# Patient Record
Sex: Female | Born: 1935 | Race: White | Hispanic: No | Marital: Single | Smoking: Former smoker
Health system: Southern US, Community
[De-identification: ages and names within clinical notes are randomized; demographics above are authoritative.]

## PROBLEM LIST (undated history)

## (undated) DIAGNOSIS — R51 Headache: Secondary | ICD-10-CM

## (undated) DIAGNOSIS — K219 Gastro-esophageal reflux disease without esophagitis: Secondary | ICD-10-CM

## (undated) DIAGNOSIS — K5790 Diverticulosis of intestine, part unspecified, without perforation or abscess without bleeding: Secondary | ICD-10-CM

## (undated) DIAGNOSIS — I10 Essential (primary) hypertension: Secondary | ICD-10-CM

## (undated) DIAGNOSIS — G5602 Carpal tunnel syndrome, left upper limb: Secondary | ICD-10-CM

## (undated) DIAGNOSIS — Z972 Presence of dental prosthetic device (complete) (partial): Secondary | ICD-10-CM

## (undated) DIAGNOSIS — K922 Gastrointestinal hemorrhage, unspecified: Secondary | ICD-10-CM

## (undated) DIAGNOSIS — M1990 Unspecified osteoarthritis, unspecified site: Secondary | ICD-10-CM

## (undated) DIAGNOSIS — J90 Pleural effusion, not elsewhere classified: Secondary | ICD-10-CM

## (undated) DIAGNOSIS — H3530 Unspecified macular degeneration: Secondary | ICD-10-CM

## (undated) DIAGNOSIS — Z974 Presence of external hearing-aid: Secondary | ICD-10-CM

## (undated) DIAGNOSIS — F0390 Unspecified dementia without behavioral disturbance: Secondary | ICD-10-CM

## (undated) DIAGNOSIS — Z973 Presence of spectacles and contact lenses: Secondary | ICD-10-CM

## (undated) HISTORY — DX: Essential (primary) hypertension: I10

## (undated) HISTORY — DX: Unspecified osteoarthritis, unspecified site: M19.90

## (undated) HISTORY — PX: THYROID CYST EXCISION: SHX2511

## (undated) HISTORY — PX: EYE SURGERY: SHX253

## (undated) HISTORY — PX: APPENDECTOMY: SHX54

## (undated) HISTORY — PX: TONSILLECTOMY: SUR1361

## (undated) HISTORY — PX: KNEE SURGERY: SHX244

## (undated) HISTORY — PX: HIP SURGERY: SHX245

## (undated) HISTORY — DX: Gastrointestinal hemorrhage, unspecified: K92.2

---

## 1998-07-20 ENCOUNTER — Other Ambulatory Visit: Admission: RE | Admit: 1998-07-20 | Discharge: 1998-07-20 | Payer: Self-pay | Admitting: Gynecology

## 1999-07-29 ENCOUNTER — Other Ambulatory Visit: Admission: RE | Admit: 1999-07-29 | Discharge: 1999-07-29 | Payer: Self-pay | Admitting: Gynecology

## 1999-08-25 ENCOUNTER — Encounter: Payer: Self-pay | Admitting: Geriatric Medicine

## 1999-08-25 ENCOUNTER — Encounter: Admission: RE | Admit: 1999-08-25 | Discharge: 1999-08-25 | Payer: Self-pay | Admitting: Geriatric Medicine

## 2000-08-01 ENCOUNTER — Other Ambulatory Visit: Admission: RE | Admit: 2000-08-01 | Discharge: 2000-08-01 | Payer: Self-pay | Admitting: Gynecology

## 2000-08-24 ENCOUNTER — Other Ambulatory Visit: Admission: RE | Admit: 2000-08-24 | Discharge: 2000-08-24 | Payer: Self-pay | Admitting: Gynecology

## 2000-09-07 ENCOUNTER — Encounter: Payer: Self-pay | Admitting: Gynecology

## 2000-09-07 ENCOUNTER — Encounter: Admission: RE | Admit: 2000-09-07 | Discharge: 2000-09-07 | Payer: Self-pay | Admitting: Gynecology

## 2000-09-21 ENCOUNTER — Ambulatory Visit (HOSPITAL_COMMUNITY): Admission: RE | Admit: 2000-09-21 | Discharge: 2000-09-21 | Payer: Self-pay | Admitting: Gynecology

## 2001-09-30 ENCOUNTER — Other Ambulatory Visit: Admission: RE | Admit: 2001-09-30 | Discharge: 2001-09-30 | Payer: Self-pay | Admitting: Gynecology

## 2001-11-29 ENCOUNTER — Encounter: Admission: RE | Admit: 2001-11-29 | Discharge: 2001-11-29 | Payer: Self-pay | Admitting: Gynecology

## 2001-11-29 ENCOUNTER — Encounter: Payer: Self-pay | Admitting: Gynecology

## 2002-12-03 ENCOUNTER — Encounter: Payer: Self-pay | Admitting: Gynecology

## 2002-12-03 ENCOUNTER — Encounter: Admission: RE | Admit: 2002-12-03 | Discharge: 2002-12-03 | Payer: Self-pay | Admitting: Gynecology

## 2003-12-04 ENCOUNTER — Other Ambulatory Visit: Admission: RE | Admit: 2003-12-04 | Discharge: 2003-12-04 | Payer: Self-pay | Admitting: Gynecology

## 2003-12-07 ENCOUNTER — Encounter: Admission: RE | Admit: 2003-12-07 | Discharge: 2003-12-07 | Payer: Self-pay | Admitting: Geriatric Medicine

## 2004-03-14 ENCOUNTER — Encounter: Admission: RE | Admit: 2004-03-14 | Discharge: 2004-03-14 | Payer: Self-pay | Admitting: Orthopedic Surgery

## 2004-04-08 ENCOUNTER — Inpatient Hospital Stay (HOSPITAL_COMMUNITY): Admission: RE | Admit: 2004-04-08 | Discharge: 2004-04-11 | Payer: Self-pay | Admitting: Orthopedic Surgery

## 2004-06-24 ENCOUNTER — Encounter: Admission: RE | Admit: 2004-06-24 | Discharge: 2004-06-24 | Payer: Self-pay | Admitting: Orthopedic Surgery

## 2004-07-29 ENCOUNTER — Ambulatory Visit (HOSPITAL_BASED_OUTPATIENT_CLINIC_OR_DEPARTMENT_OTHER): Admission: RE | Admit: 2004-07-29 | Discharge: 2004-07-29 | Payer: Self-pay | Admitting: *Deleted

## 2004-07-29 ENCOUNTER — Ambulatory Visit (HOSPITAL_COMMUNITY): Admission: RE | Admit: 2004-07-29 | Discharge: 2004-07-29 | Payer: Self-pay | Admitting: *Deleted

## 2004-08-28 HISTORY — PX: JOINT REPLACEMENT: SHX530

## 2004-12-09 ENCOUNTER — Encounter: Admission: RE | Admit: 2004-12-09 | Discharge: 2004-12-09 | Payer: Self-pay | Admitting: Gynecology

## 2005-05-04 ENCOUNTER — Inpatient Hospital Stay (HOSPITAL_COMMUNITY): Admission: RE | Admit: 2005-05-04 | Discharge: 2005-05-09 | Payer: Self-pay | Admitting: Orthopedic Surgery

## 2005-05-04 ENCOUNTER — Ambulatory Visit: Payer: Self-pay | Admitting: Physical Medicine & Rehabilitation

## 2005-12-11 ENCOUNTER — Encounter: Admission: RE | Admit: 2005-12-11 | Discharge: 2005-12-11 | Payer: Self-pay | Admitting: Geriatric Medicine

## 2007-03-27 ENCOUNTER — Encounter: Admission: RE | Admit: 2007-03-27 | Discharge: 2007-03-27 | Payer: Self-pay | Admitting: Geriatric Medicine

## 2010-09-18 ENCOUNTER — Encounter: Payer: Self-pay | Admitting: Geriatric Medicine

## 2010-09-28 HISTORY — PX: CARPAL TUNNEL RELEASE: SHX101

## 2010-09-29 ENCOUNTER — Encounter (HOSPITAL_BASED_OUTPATIENT_CLINIC_OR_DEPARTMENT_OTHER)
Admission: RE | Admit: 2010-09-29 | Discharge: 2010-09-29 | Disposition: A | Discharge status: Home / Self Care | Payer: Medicare Other | Source: Ambulatory Visit | Attending: Orthopedic Surgery | Admitting: Orthopedic Surgery

## 2010-09-29 ENCOUNTER — Ambulatory Visit
Admission: RE | Admit: 2010-09-29 | Discharge: 2010-09-29 | Disposition: A | Discharge status: Home / Self Care | Payer: Medicare Other | Source: Ambulatory Visit | Attending: Orthopedic Surgery | Admitting: Orthopedic Surgery

## 2010-09-29 ENCOUNTER — Other Ambulatory Visit: Payer: Self-pay | Admitting: Orthopedic Surgery

## 2010-09-29 DIAGNOSIS — Z01812 Encounter for preprocedural laboratory examination: Secondary | ICD-10-CM | POA: Insufficient documentation

## 2010-09-29 DIAGNOSIS — Z01811 Encounter for preprocedural respiratory examination: Secondary | ICD-10-CM

## 2010-09-29 DIAGNOSIS — Z01810 Encounter for preprocedural cardiovascular examination: Secondary | ICD-10-CM | POA: Insufficient documentation

## 2010-09-29 LAB — BASIC METABOLIC PANEL
BUN: 10 mg/dL (ref 6–23)
Calcium: 10.1 mg/dL (ref 8.4–10.5)
Chloride: 96 mEq/L (ref 96–112)
GFR calc Af Amer: >60 mL/min (ref >60)
Glucose, Bld: 105 mg/dL — ABNORMAL HIGH (ref 70–99)
Potassium: 4.1 mEq/L (ref 3.5–5.1)
Sodium: 137 mEq/L (ref 135–145)

## 2010-10-03 ENCOUNTER — Ambulatory Visit (HOSPITAL_BASED_OUTPATIENT_CLINIC_OR_DEPARTMENT_OTHER)
Admission: RE | Admit: 2010-10-03 | Discharge: 2010-10-04 | Disposition: A | Discharge status: Home / Self Care | Payer: Medicare Other | Source: Ambulatory Visit | Attending: Orthopedic Surgery | Admitting: Orthopedic Surgery

## 2010-10-03 DIAGNOSIS — G5600 Carpal tunnel syndrome, unspecified upper limb: Secondary | ICD-10-CM | POA: Insufficient documentation

## 2010-10-03 DIAGNOSIS — Z01818 Encounter for other preprocedural examination: Secondary | ICD-10-CM | POA: Insufficient documentation

## 2010-10-03 DIAGNOSIS — Z01812 Encounter for preprocedural laboratory examination: Secondary | ICD-10-CM | POA: Insufficient documentation

## 2010-10-17 NOTE — Op Note (Signed)
  NAMEJOVANA, [name]               ACCOUNT NO.:  [id number]  MEDICAL RECORD NO.:  [id number]            PATIENT TYPE:  LOCATION:                                 FACILITY:  PHYSICIAN:  Cindee Salt, M.D.            DATE OF BIRTH:  DATE OF PROCEDURE:  10/04/2010 DATE OF DISCHARGE:                              OPERATIVE REPORT   PREOPERATIVE DIAGNOSIS:  Carpal tunnel syndrome, right hand.  POSTOPERATIVE DIAGNOSIS:  Carpal tunnel syndrome, right hand.  OPERATION:  Decompression, right median nerve.  SURGEON:  Cindee Salt, MD  ASSISTANT:  None.  ANESTHESIA:  Forearm-based IV regional.  HISTORY:  The patient is a 77 year old female with a history of severe carpal tunnel syndrome, EMG nerve conductions positive creating a significant pain discomfort.  She has elected to undergo surgical decompression.  Pre, peri, and postoperative course have been discussed along with risks and complications.  She is aware that there is no guarantee with surgery, possibility of infection, recurrence, injury to arteries, nerves, tendons, incomplete relief of symptoms, dystrophy, that there will be no significant motor return, she may not have sensory return, it is being done in an effort to help relieve some of her pain, numbness, and tingling.  In the preoperative area, the patient is seen, the extremity marked by both the patient and surgeon, and antibiotic given.  PROCEDURE IN DETAIL:  The patient was brought to the operating room where a forearm based IV regional anesthetic was carried out without difficulty.  She was prepped using ChloraPrep, supine position, right arm free.  A 3-minute dry time was allowed.  Time-out taken confirming the patient and procedure.  A longitudinal incision was made in the palm and carried down through the subcutaneous tissue.  Bleeders were electrocauterized.  Palmar fascia was split.  Superficial palmar arch identified.  The flexor tendon to the ring little finger  identified.  To the ulnar side of median nerve carpal retinaculum was incised with sharp dissection.  A right angle and Sewall retractor were placed between skin and forearm fascia.  The fascia was released for approximately 1.5 cm proximal to the wrist crease under direct vision.  Canal was explored. Air compression to the nerve with an hourglass deformity was immediately apparent.  No further lesions were identified.  The wound was irrigated. Skin closed with interrupted 5-0 Vicryl Rapide sutures.  Local infiltration with 0.25%Marcaine without epinephrine was given, approximately 5 mL was used.  A sterile compressive dressing and splint with fingers free was applied.  On deflation of the tourniquet, all fingers immediately pinked.  She was taken to the recovery room for observation in satisfactory condition.  She will be discharged home to return to the [facility] in 1 week on Vicodin.    ______________________________ Cindee Salt, M.D.   ______________________________ Cindee Salt, M.D.    GK/MEDQ  D:  10/04/2010  T:  10/05/2010  Job:  [id number]  Electronically Signed by Cindee Salt M.D. on 10/17/2010 02:25:05 PM

## 2011-01-13 NOTE — Op Note (Signed)
NAMESUNNI, [name]               ACCOUNT NO.:  [id number]   MEDICAL RECORD NO.:  [id number]          PATIENT TYPE:  INP   LOCATION:  X001                         FACILITY:  [facility]   PHYSICIAN:  Marlowe Kays, M.D.  DATE OF BIRTH:  November 04, 1916   DATE OF PROCEDURE:  05/04/2005  DATE OF DISCHARGE:                                 OPERATIVE REPORT   PREOPERATIVE DIAGNOSIS:  Osteoarthritis right hip.   POSTOPERATIVE DIAGNOSIS:  Osteoarthritis right hip.   OPERATION:  Osteonics total hip replacement, right.   SURGEON:  Marlowe Kays, M.D.   ASSISTANT:  Georges Lynch. Darrelyn Hillock, M.D.   ANESTHESIA:  General.   PATHOLOGY AND JUSTIFICATION FOR PROCEDURE:  She has had a total hip  replacement on the left, total knee replacement on the right, has advanced  osteoarthritis with pain in the right hip.   PROCEDURE:  Prophylactic antibiotics, satisfactory anesthesia, Foley  catheter inserted, left lateral decubitus position on the Mark II frame,  right hip was prepped DuraPrep. Draped sterile field.  Ioban employed.  Posterior lateral incision down to the fascia lata.  Zickles band was cut.  The external rotators were detached from the femur. The partial capsulectomy  was performed and the hip dislocated. I amputated the femoral neck right  below the femoral head and cleared the pyriformis fossa of soft tissue. I  then placed a guide pin down through it into the femoral canal, used the  greater trochanteric reamer and the vertical reamers up to #8, along the way  also made my definitive cut of the femoral neck fingerbreadth above the  lesser trochanter. We then began the rasping process but we were only able  to get up to a 6. Again we took great care not to create any iatrogenic  fracture at age 47. Condition was somewhat fragile. We also took great care  not to stress the leg both to avoid fractured femur and also because of her  total knee replacement.  I then measured for the distal canal  plug at a  number 4 and this was placed. I then packed the femur with a dry gauze while  we cleared the acetabulum of soft tissue and then began deepening and  expanding reaming process working up to a 52, went through a trial reduction  for cup position stability and we then placed the final 52E Trident cup  stabilizing it with two screws and then went through another trial reduction  followed by a 10 degrees liner with scribe line at 10 o'clock. Then went  back to the femur where we water picked the femur while the methacrylate was  being mixed and then introduced it with the glue gun and pressurized  technique followed by the size 6 132 degrees neck angle prosthesis.  Methacrylate hardened and excess methacrylate had been removed, we went  through another trial reduction found a +5 neck with 36 mm head was  appropriate and stable. Consequently went ahead and placed the final 36 mm  head 5 mm neck and reduced the hip, found it to be stable, wound was  irrigated with  sterile saline. Closure performed with interrupted #1 Vicryl  in Zickle's band and fascia lata, combination of 0 and 2-0 Vicryl subcu  tissue and staples in the skin. Betadine Adaptic dry sterile  dressing were applied. She was gently placed on her back and abduction  pillow and taken recovery room in satisfactory condition. Estimated blood  loss was 800 mL. She was given 2 units of blood which were started in the  operating room.           ______________________________  Marlowe Kays, M.D.     JA/MEDQ  D:  05/04/2005  T:  05/04/2005  Job:  [id number]

## 2011-01-13 NOTE — Discharge Summary (Signed)
[name], [name]                           ACCOUNT NO.:  [id number]   MEDICAL RECORD NO.:  [id number]                   PATIENT TYPE:  INP   LOCATION:  0464                                 FACILITY:  [facility]   PHYSICIAN:  Marlowe Kays, M.D.               DATE OF BIRTH:  [birth year]   DATE OF ADMISSION:  04/08/2004  DATE OF DISCHARGE:  04/11/2004                                 DISCHARGE SUMMARY   ADMISSION DIAGNOSES:  1. Spinal stenosis at L2-3, L3-4, L4-5.  2. Hypertension.  3. Diverticulitis.  4. History of migraine.  5. Status post left total knee replacement arthroplasty with revision.  6. Right total knee replacement arthroplasty.   DISCHARGE DIAGNOSES:  1. Spinal stenosis at L2-3, L3-4, L4-5.  2. Hypertension.  3. Diverticulitis.  4. History of migraine.  5. Status post left total knee replacement arthroplasty with revision.  6. Right total knee replacement arthroplasty.  7. Urinary tract infection noted on April 06, 2004, treated with     perioperative antibiotics.   OPERATION:  On April 08, 2004, the patient underwent central decompressive  lumbar laminectomy at L2-3, L3-4, and L4-5.  Dr. Ranee Gosselin assisted.   HISTORY:  This 77 year old lady with progressive problems concerning pain  into her back and radiation to her lower extremities.  Standing increases  her discomfort which now has been going on for a better part of a year.  She  is a very active lady and has found that her overall quality of life has  deteriorated secondary to her pain and inability to get about.  She has had  injections by Dr. Ethelene Hal for pain control, Lidoderm patch, and other anti-  inflammatories, but unfortunately this has not helped.  MRI has shown multi-  level lumbar disk disease with subluxation at L3-4.  Foraminal stenosis is  seen at the other lumbar vertebrae.  Due to the progressive problems not  responding to conservative care, it is felt she would benefit with surgical  intervention, and was admitted for the above procedure.   HOSPITAL COURSE:  The patient tolerated the surgical procedure quite well.  She had somewhat of a headache as well as some nausea postoperatively.  We  put her n.p.o. for a day and changed her medications to Toradol to avoid  strong analgesics which could obstruct GI motility.  She was seen by  physical therapy on the day of discharge.  Did really quite well.  She will  be able to have physical therapy at [facility].  She was discharged home by  Mr. Avel Peace and Dr. Simonne Come.  Wound was dry.  Neurovascular lower  extremity.  She was quite relieved of her buttock pain secondary to her  present illness.   LABORATORY DATA:  CBC with differential which was within normal limits.  She  did show a mild elevation in hematocrit and hematocrit.  Blood chemistries  were negative,  and urinalysis preoperatively did show a slight urinary tract  infection and as mentioned above was treated.  Electrocardiogram showed  sinus tachycardia with occasional premature supraventricular complexes, left  atrial enlargement, left axis deviation, left ventricular hypertrophy with  repolarization abnormality.  Chest x-ray showed advanced COPD without active  diseases.   CONDITION ON DISCHARGE:  Improved and stable.   PLAN:  The patient is discharged to [facility] to continue with her rehab  postoperatively.  Dressing may be changed p.r.n.  Return to the clinic two  weeks after the surgery to see Dr. Simonne Come.  May shower in four days.  Encourage walking.   DISCHARGE MEDICATIONS:  1. Continue with her home medications under the direction of Dr. Merlene Laughter and Dr. Sherin Quarry.  2. Robaxin for muscle relaxant.  3. Demerol tabs for pain.   DIET:  Continue with home diet under the direction of Dr. Merlene Laughter and  Dr. Sherin Quarry.     [name].                 Marlowe Kays, M.D.    DLU/MEDQ  D:  04/20/2004  T:  04/20/2004   Job:  [id number]   cc:   Tasia Catchings, M.D.  [address]  [address]  [place]  [place] [address]  Fax: [phone]   Hal T. Stoneking, M.D.  [address] [place], [place] [address]  Fax: [phone]

## 2011-01-13 NOTE — H&P (Signed)
NAMESHANDREKA, DANTE               ACCOUNT NO.:  [id number]   MEDICAL RECORD NO.:  [id number]          PATIENT TYPE:  INP   LOCATION:  NA                           FACILITY:  [facility]   PHYSICIAN:  Marlowe Kays, M.D.  DATE OF BIRTH:  1916/11/01   DATE OF ADMISSION:  05/04/2005  DATE OF DISCHARGE:                                HISTORY & PHYSICAL   CHIEF COMPLAINT:  Pain in my right hip.   HISTORY OF PRESENT ILLNESS:  This 77 year old lady has had a considerable  amount of pain and discomfort into her right hip, more so at night.  She has  difficulty standing over long periods of time and as well has had more  difficulty getting about.  She is a very active lady.  Fortunately, her  husband has died recently, and she would like to remain as independent as  possible but finds that her hip is markedly interfering with her day to day  activities.  She has had left total hip replacement and arthroplasty in the  past with revision and is doing quite well with that.  X-rays have shown  degenerative changes as well as near obliteration of the joint space of the  right hip.  After much discussion, including the risks and benefits of  surgery, it is decided to go ahead with the above procedure.  The patient  has been cleared preoperatively by Dr. Merlene Laughter of [facility] at [facility] for this procedure.   PAST MEDICAL HISTORY:  This lady has been in relatively good health  throughout her lifetime.   PAST SURGICAL HISTORY:  1.  Appendectomy in 1930.  2.  Right total knee replacement arthroplasty in 1996.  3.  Left hip replacement originally in 1980 with revision in 1997.  4.  Partial thyroidectomy in 1990.   Currently, she is being treated for hypertension.   CURRENT MEDICATIONS:  1.  Norvasc 10 mg 1 daily.  2.  Univasc 15 mg 1 daily.  3.  Hydrochlorothiazide 25 mg daily.  4.  Celebrex 200 mg daily.  5.  Doxazosin 4 mg daily.   FAMILY HISTORY:  Positive for heart  disease in her mother, who died in 58.  Father was diabetic and died at 62.   SOCIAL HISTORY:  The patient is widowed.  Has retired.  Has no intake of  tobacco products but has a glass of white wine daily.  She has one child,  one daughter, who lives in [place], and will be here during the surgery.  She lives in a garden home in [place].   REVIEW OF SYSTEMS:  CNS:  No seizures or paralysis.  No numbness or double  vision.  RESPIRATORY:  No productive cough.  No hemoptysis.  No shortness of  breath.  CARDIOVASCULAR:  No chest pain.  No angina.  No orthopnea.  GASTROINTESTINAL:  No nausea, vomiting, melena, or bloody stools.  GENITOURINARY:  No discharge, dysuria, or hematuria.  MUSCULOSKELETAL:  Primarily as in the present illness.   PHYSICAL EXAMINATION:  VITAL SIGNS:  Blood pressure 118/72, pulse 86,  respirations 12.  GENERAL:  An alert, cooperative, friendly 77 year old white female who is  very nicely dressed and oriented x 3.  HEENT:  Normocephalic.  PERRLA.  EOMs are intact.  Oropharynx is clear.  CHEST:  Clear to auscultation.  No rales or rhonchi.  HEART:  Regular rate and rhythm.  No murmurs are heard.  ABDOMEN:  Soft and nontender.  Liver and spleen not felt.  GENITOURINARY/RECTAL/BREASTS:  Not done.  Not pertinent to the present  illness.  EXTREMITIES:  Patient has painful range of motion of the right hip,  particularly on internal and external rotation, which is really quite  minimal.  Neurovascular is intact to the right lower extremity.   ADMISSION DIAGNOSES:  1.  Osteoarthritis, right hip.  2.  Hypertension.   PLAN:  Patient will undergo right total hip replacement arthroplasty.  We  may consider her returning to her home at [facility] with outpatient  physical therapy, as she has a Comptroller during the day.  She may need to be in  [facility] rehab for a little while, and we will certainly see how she does with  her post total hip therapy.  Should we have  any medical problems, we will  certainly ask Dr. Merlene Laughter to follow along with [place] during this  patient's hospitalization.      [name].    ______________________________  Marlowe Kays, M.D.    DLU/MEDQ  D:  04/27/2005  T:  04/27/2005  Job:  [id number]   cc:   Hal T. Stoneking, M.D.  [address] [place], [place] [address]  Fax: [phone]

## 2011-01-13 NOTE — Discharge Summary (Signed)
[name], [name]               ACCOUNT NO.:  [id number]   MEDICAL RECORD NO.:  [id number]          PATIENT TYPE:  INP   LOCATION:  1504                         FACILITY:  [facility]   PHYSICIAN:  Marlowe Kays, M.D.  DATE OF BIRTH:  25-Aug-1917   DATE OF ADMISSION:  05/04/2005  DATE OF DISCHARGE:                                 DISCHARGE SUMMARY   ADMITTING DIAGNOSES:  1.  Osteoarthritis of the right hip.  2.  Hypertension.   DISCHARGE DIAGNOSES:  1.  Osteoarthritis of the right hip.  2.  Subluxation right total hip arthroplasty secondary to malposition of the      acetabular component.  3.  Hypertension.  4.  Hypokalemia (treated).  5.  Hyponatremia (treated).  6.  Postoperative anemia (treated with transfusion).   OPERATIONS:  1.  On May 04, 2005 the patient underwent Osteonics total hip      replacement arthroplasty of the right hip; Dr. Georges Lynch. Gioffre      assisted.  2.  Revision of acetabular component right total hip arthroplasty, Dr. Lajoyce Corners assisted.   BRIEF HISTORY:  This 77 year old lady with progressing pain into her right  hip, difficulty sleeping, difficulty getting about. She is a very active  lady who enjoys the social life at [employer] as well as her family, and  has found that the pain in her right hip has markedly interfered with her  day-to-day activities. 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 After much consideration  including the risks and benefits of surgery, she was scheduled for the above  procedure. Dr. Merlene Laughter of [facility] at [facility]  cleared her for this procedure.   COURSE IN THE HOSPITAL:  The patient tolerated the surgical procedure quite  well. Postoperative x-rays showed that there was subluxation of the  prosthetic femoral head in the acetabulum. X-rays were repeated  and  confirmed that this was the fact. The patient was placed in Buck's traction  until we could have time on the operating room schedule for the above second  procedure.   After her first surgery, the patient developed anemia which is not uncommon  with this procedure and this size of this lady, and her hemoglobin was 8.5,  hematocrit was 24.1. After discussion with her and her step-daughter, it was  decided she would benefit with transfusion and we transfused her. The  patient tolerated the second surgery quite well, was transfused with 2 units  of banked blood at that time. After that, she remained awake and alert. She  did have a drop in her sodium and potassium, thought primarily due to  hemodilution from IV fluids intraoperatively and postoperatively. We treated  with KCl, 40 mEq of potassium q.a.m. and 20 mEq of potassium q.p.m. This  brought her potassium up to 3.6 on the day of discharge. We restricted  water, offered her juices and colas, bringing her sodium up to 133 from 131.  This lady's activities with physical therapy was touchdown weightbearing to  the right lower extremity. She was primarily doing transfers to bedside  chair. However, she can certainly begin ambulation, maintaining touchdown  weightbearing to the right lower extremity for at least 6-8 weeks after the  date of surgery. ADLs should be initiated as well.   We will certainly rely on Dr. Laverle Hobby expertise concerning medical  management, which he has provided this patient in the past. Orthopedically,  the staples can be removed at 2 to 2-and-a-half weeks after the date of  surgery, and have the Steri-Strips applied if necessary.   Laboratory values in the hospital hematologically showed a preoperative CBC  completely within normal limits, the hemoglobin was 13.3, hematocrit was  39.5. Final hemoglobin was 11.6, hematocrit was 33.7. Preoperative blood  chemistries were within normal limits other than  glucose slightly elevated  at 100 (nonfasting). Final electrolytes showed a sodium of 133, the  potassium was 3.6, chloride was 94, CO2 was 34, BUN was 3. Urinalysis  preoperatively showed a suspicion of a urinary tract infection; however,  nitrites were negative, leukocyte esterase were large. This was repeated on  May 04, 2005 showing the same, white cells were in clumps, many  bacteria (it is questionable this is a true clean-catch). The chest x-ray  showed advanced COPD without active disease. Electrocardiogram from March 06, 2005 showed sinus rhythm, borderline first degree A-V block.   CONDITION ON DISCHARGE:  Improved, stable.   PLAN:  The patient is transferred back to [facility] skilled nursing  facility to continue with her postoperative rehabilitation. She is to  maintain touchdown weightbearing to the right lower extremity. Wound care  p.r.n., dry dressing p.r.n., staples out 2 to 2-and-a-half weeks after  surgery, Steri-Strips applied as necessary. Return to see Dr. Simonne Come 2  weeks after the date of the second surgery. Should there be any questions or  problems, please call our office at [phone].   Again, we will rely on Dr. Laverle Hobby expertise concerning medical  management of this patient.   MEDICATIONS AT DISCHARGE:  In-hospital medications that were given the  patient at discharge are:  1.  Colace 100 mg capsule b.i.d.  2.  Senokot b.i.d. a.c.  3.  Trinsicon t.i.d.  4.  Hydrochlorothiazide 25 mg daily.  5.  Mavik 2 mg tablet daily.  6.  Norvasc 10 mg daily.  7.  Cardura 4 mg daily.  8.  Flexeril 10 mg tablet q.h.s.  9.  K-Dur 20 mEq tablet two in the morning and one in the evening.  10. Pepcid AC 10 mg q.h.s.  11. Lovenox 40 mg q.12h. to be given until the patient is therapeutic with      an INR of 2.0, then Coumadin protocol to maintain INR around 2.0 for 4      weeks after the date of surgery.  12. Ocuvite tablet b.i.d. 13. Demerol tablet or  Vicodin tablet or Tylox tablet p.r.n. pain.  14. Robaxin 500 mg p.o. q.6h. p.r.n. muscle spasm.  15. Reglan is used for nausea.      [name].    ______________________________  Marlowe Kays, M.D.    DLU/MEDQ  D:  05/09/2005  T:  05/09/2005  Job:  [id number]   cc:   Hal T. Stoneking, M.D.  [address] [place], [place] [address]  Fax: [phone]   Copy with the patient

## 2011-01-13 NOTE — Op Note (Signed)
[name], [name]               ACCOUNT NO.:  [id number]   MEDICAL RECORD NO.:  [id number]          PATIENT TYPE:  INP   LOCATION:  1504                         FACILITY:  [facility]   PHYSICIAN:  Marlowe Kays, M.D.  DATE OF BIRTH:  [birth year]   DATE OF PROCEDURE:  05/06/2005  DATE OF DISCHARGE:                                 OPERATIVE REPORT   PREOPERATIVE DIAGNOSES:  Subluxation right total hip arthroplasty secondary  to malposition of the acetabular component.   POSTOPERATIVE DIAGNOSES:  Subluxation right total hip arthroplasty secondary  to malposition of the acetabular component.   OPERATION:  Revision of acetabular component right total hip arthroplasty.   SURGEON:  Marlowe Kays, M.D.   ASSISTANT:  Madlyn Frankel. Charlann Boxer, M.D.   ANESTHESIA:  General.   PATHOLOGY AND JUSTIFICATION FOR PROCEDURE:  Two days ago, I performed what  appeared to be a standard total hip replacement for her. Her bones were  quite soft. I had used two screws for acetabular fixation and one of my  partners, Dr. Darrelyn Hillock, had assisted me in the total hip which appeared to be  completely stable on the operating room table. Postoperatively she had  subluxation of the femoral head and on lateral x-ray, it appeared that the  cup had migrated into a more horizontal position with the femoral heads  going superiorly. Accordingly, she is here today for revision.   PROCEDURE:  Prophylactic antibiotics, satisfactory general anesthesia,  placed on the Mark II frame in the left lateral decubitus position, the  right hip area was prepped with DuraPrep, draped in a sterile field. The  previous staples were removed, Ioban was then applied.  We went through the  old surgical incision, the Zickel's band did not need to be re-opened. On  going down to the hip socket, the hip was reduced and initially appeared to  be very stable. There was no evidence for any instability to the cup.  We  then began moving the hip around  knowing that the hip had subluxed with her  in an extension position. What it appeared was happening was that the 10  degree liner might be impinging on the femoral neck and that there was too  much anteversion to the cup and the combination of the two was levering the  femoral head out anteriorly. Accordingly, I knocked off the femoral head  with a bone tap and mallet and placed the neck into the anterior joint which  allowed [place] access to the acetabular liner which I removed with drill and  screw technique. I then removed the two pelvic fixation screws which  appeared to be well embedded followed by the cup which we cleaned and saved.  We then re-reamed the cup gently roughening up some of the acetabular liner  but being careful because she had a very thin socket. We re-reamed up to a  50 mm.  We then replaced the 52 liner that we had taken out and stabilized  it with two superior screws which were drilled, measured and screwed . The  cup appeared to be nice and stable  in this position. We then went through a  trial reduction with a +5 C-tapered head once again and the hip appeared to  be nice and stable without the impingement problems that we had previously  encountered and we tried to mimic the extension position of instability. We  then proceeded with placing a new 10 degree liner but this time the liner  was dialed more posteriorly to try and prevent some of the impingement  problem. The previous 5 mm C-tapered head was reapplied, the hip reduced and  as near as we can tell clinically it was completely stable on the operating  room table. The wound was then well irrigated with sterile saline and  closure once again performed with interrupted #1 Vicryl in the fascia lata  and deep subcutaneous tissue, 2-0 Vicryl in the superficial subcutaneous  tissues and staples in the skin. Betadine Adaptic dry sterile dressing were  applied. She was gently placed on her back once again on the  operating room  table in the abduction pillow and transferred to the PACU bed with no known  complications. Postoperative x-rays to follow. She lost 400 mL of blood  during the operation and was given 2 units of blood replacement because her  blood pressure remained consistently low and her hemoglobin was around 10  intraoperatively.           ______________________________  Marlowe Kays, M.D.     JA/MEDQ  D:  05/06/2005  T:  05/06/2005  Job:  [id number]

## 2011-01-13 NOTE — Op Note (Signed)
[name], [name]                           ACCOUNT NO.:  [id number]   MEDICAL RECORD NO.:  [id number]                   PATIENT TYPE:  INP   LOCATION:  0010                                 FACILITY:  [facility]   PHYSICIAN:  Marlowe Kays, M.D.               DATE OF BIRTH:  Sep 29, 1916   DATE OF PROCEDURE:  04/08/2004  DATE OF DISCHARGE:                                 OPERATIVE REPORT   PREOPERATIVE DIAGNOSES:  Spinal stenosis at L2-3, L3-4, and L4-5.   POSTOPERATIVE DIAGNOSES:  Spinal stenosis at L2-3, L3-4, and L4-5.   OPERATION:  Central decompressive laminectomy L2-3, L3-4, L4-5.   SURGEON:  Marlowe Kays, M.D.   ASSISTANT:  Georges Lynch. Darrelyn Hillock, M.D.   ANESTHESIA:  General.   PATHOLOGY AND JUSTIFICATION FOR PROCEDURE:  She is having back and bilateral  leg pain has scoliosis.  Myelograms demonstrated severe spinal stenosis at  L3-4, moderately severe at L4-5 and at L2-3.  Accordingly she is here for  the above mentioned surgery. She is 47 but was cleared for surgery by her  medical physician.   DESCRIPTION OF PROCEDURE:  Prophylactic antibiotics, Foley catheter inserted  after satisfactory general anesthesia. We took great care to protect her  total hip replacement on the left and her knee replacement on the right. She  was gently placed in the prone position on the Wilson frame, back was  prepped with Duraprep, draped in a sterile field, Ioban employed. I made a  vertical midline incision and isolated the two spinous processes in the base  of the wound which I tagged with Kocher clamps, took a lateral x-ray  indicating that these were on the spinous processes of L2 and L3.  Accordingly, I continued the incision distally until we had isolated the  spinous processes of L4 and L5.  We then cleared the soft tissue off the  neural arches from L2 to L5, placed two self retaining McCullough  retractors. With a double action rongeur, I removed the spinous processes of  L3 and  L4 completely and the inferior portions of L2 and superior L5.  I  then removed a portion of the neural arches with double action rongeur and  then with a combination of 2 and 3 mm Kerrison rongeurs and double action  rongeur, we carefully decompressed her spinal canal, made it a little more  complicated because she had scoliosis and worked mainly in the midline  initially and then worked laterally.  The final decompression was done using  the microscope.  At the conclusion of the case, she was well decompressed  centrally and laterally with all foramina bilaterally being widely patent to  a hockey stick.  Throughout the case, there was no unusual bleeding, bone  was placed on raw bone, irrigated the wound thoroughly throughout the case,  Gelfoam soaked in thrombin was placed over the dura and  around the  perimeter of  the resection. I then closed the wound over a 1/4 inch Penrose  drain with interrupted #1  Vicryl in the paralumbar muscle and fascia, 2-0 Vicryl in the subcutaneous  tissue and staples in the skin.  Betadine Adaptic dry sterile dressing were  applied. She tolerated the procedure well and was taken to the recovery room  in satisfactory condition with no known complications.                                               Marlowe Kays, M.D.    JA/MEDQ  D:  04/08/2004  T:  04/09/2004  Job:  [id number]

## 2011-01-13 NOTE — Op Note (Signed)
NAMEJARAH, PEMBER               ACCOUNT NO.:  [id number]   MEDICAL RECORD NO.:  [id number]          PATIENT TYPE:  AMB   LOCATION:  NESC                         [facility]ACILITY:  [facility]   PHYSICIAN:  Vikki Ports, MDDATE OF BIRTH:  02-23-1917   DATE OF PROCEDURE:  07/29/2004  DATE OF DISCHARGE:                                 OPERATIVE REPORT   PREOPERATIVE DIAGNOSIS:  Right inguinal hernia.   POSTOPERATIVE DIAGNOSIS:  Right inguinal hernia.   PROCEDURE:  Laparoscopic right inguinal hernia repair with mesh.   SURGEON:  Vikki Ports, M.D.   ANESTHESIA:  General.   DESCRIPTION OF PROCEDURE:  The patient was taken to the operating room and  placed in a supine position. After adequate general anesthesia was  introduced using endotracheal tube, Foley catheter was placed, and the  abdomen was prepped and draped in a normal sterile fashion. Using an  infraumbilical transverse incision, I dissected down onto the right rectus  fascia. Transverse incision was made in that, and rectus muscles were  retracted laterally. Preperitoneal space was blunted dissected using a Kelly  clamp, and the dissecting balloon was placed in the preperitoneal space.  Under direct vision, the dissecting balloon was insufflated. This was  removed, and the operating balloon was placed. Pneumo-prepertioneum was  obtained. Two 5-mm trocars were placed left of the midline and had an  adequate dissection. I continued the dissection out laterally, identified  Cooper's ligament. The hernia defect was easily visualized, and there were  no contents within it. A large Bard contoured mesh was then placed in the  preperitoneum space and tacked to cooper's ligament and medially and  laterally to the epigastric vessels. The pneumo-preperitoneum was released,  trocars were removed, the fascial defect was closed with a 0 figure-of-eight  suture, skin was closed with subcuticular 4-0 Monocryl, Steri-Strips and  sterile dressings were applied. The patient tolerated the procedure well and  went to the PACU in good condition.     Gaylyn Rong   KRH/MEDQ  D:  07/29/2004  T:  07/29/2004  Job:  [id number]

## 2011-01-13 NOTE — H&P (Signed)
[name], [name]                           ACCOUNT NO.:  [id number]   MEDICAL RECORD NO.:  [id number]                   PATIENT TYPE:   LOCATION:                                       FACILITY:   PHYSICIAN:  Marlowe Kays, M.D.               DATE OF BIRTH:  11-05-1916   DATE OF ADMISSION:  DATE OF DISCHARGE:                                HISTORY & PHYSICAL   CHIEF COMPLAINT:  Pain in my back.   HISTORY OF PRESENT ILLNESS:  This 77 year old, white female, been seen by  Dr. Simonne Come, for continuing and progressive problems concerning her low  back and radiation of pain into the buttocks area.  She has had gradual  onset of pain into her low back since the early part of this year.  Standing  for long periods of time markedly increases her discomfort.  Sitting, lying  down, resting reduces her discomfort.  She has had no trauma to her low  back.  She is an extremely active lady, who enjoys social activities at her  home at [place].  She has been seen by Dr. Ethelene Hal our physiatrist, who  has treated her with injections, Lidoderm patch, and anti-inflammatories.  Unfortunately, she has not progressed with conservative treatment.  MRI of  the lumbar spine showed advanced multilevel lumbar disk disease with facet  arthrosis.  She also has subluxation of 3/4.  The L2-L3 is also seen with  right foraminal stenosis.  This pathology was also seen at L4-5.  Unfortunately, L5-S1 was involved with foraminal stenosis related to a spur.  After much discussion and the complications of the surgery had been  discussed with her, it was felt as though she had a multifactorial stenosis  and was digressing as far as her level of activity was concerned, that she  would benefit from surgical intervention and being admitted for central  decompressive lumbar laminectomy from L2 through L5.  She has been cleared  preoperatively by Dr. Merlene Laughter.   PAST MEDICAL HISTORY:  1. Actually, this lady has  been in relatively good health throughout her     lifetime.  2. She does have degenerative joint disease.  3. History of migraine headaches.  4. Some anxiety and diverticulosis with occasional diverticulitis.  5. She also has hypertension.   CURRENT MEDICATIONS:  1. Norvasc 5 mg 1 daily.  2. Doxazosin daily.  3. Univasc 15 mg daily.  4. Hydrochlorothiazide daily.  5. Cyclobenzaprine 10 mg p.r.n.  6. __________ 10 mg daily.  7. Celebrex 200 mg 1 daily.  8. She also takes Ocuvite and Miralax.  9. She takes over-the-counter Prilosec occasionally.   ALLERGIES:  She has no medical allergies, but she had a PCA PUMP with one of  her total joints, and it caused nausea.  This is probably MORPHINE SULFATE  or DILAUDID.   PAST SURGICAL HISTORY:  1. Left total hip replacement arthroplasty  in 1980 with revision in 1997.  2. Right total knee replacement arthroplasty in 1995.  3. Partial thyroidectomy in the past.   FAMILY PHYSICIAN:  Dr. Merlene Laughter.  She is also seen by Dr. Tasia Catchings of [facility].   FAMILY HISTORY:  Positive for heart disease in the mother and diabetes in  the father.   SOCIAL HISTORY:  She is married, retired, lives at [employer], is  independent living.  She has no intake of tobacco products and has 1-2  glasses of wine at dinnertime.  Care giver after surgery will be her  daughter.   REVIEW OF SYSTEMS:  CNS:  No seizure, stroke, or paralysis, numbness, double  vision.  RESPIRATORY:  No productive cough, no hemoptysis, no shortness of  breath.  CARDIOVASCULAR:  No chest pain, no angina, no orthopnea.  GASTROINTESTINAL:  No nausea, vomiting, melena, or bloody stool.  Occasionally she will take Prilosec for stomach ache.  GENITOURINARY:  No  discharge, dysuria, or hematuria.  MUSCULOSKELETAL:  Primarily in present  illness, as her back and radiation to buttocks, more so on the right than  the left.   PHYSICAL EXAMINATION:  GENERAL:  Alert,  cooperative, and friendly, 86-year-  old white female, who is obviously is uncomfortable.  She walks with a  walker.  VITAL SIGNS:  Blood pressure 140/68, pulse 88, respirations 12.  HEENT:  Normocephalic.  PERRLA.  Wears glasses.  Oropharynx is clear.  CHEST:  Clear to auscultation.  No rhonchi, no rales.  HEART:  Regular rate and rhythm.  No murmurs are heard.  ABDOMEN:  Soft, nontender.  Liver or spleen not felt.  GENITALIA/RECTAL/PELVIC/BREASTS:  Not done.  Not pertinent for present  illness.  EXTREMITIES:  No deformities and no muscle weakness or sensory deficit.   ADMISSION DIAGNOSES:  1. Spinal stenosis with L2-L3, L3-L4, L4-L5.  2. Hypertension.  3. Diverticulitis.  4. History of migraine.  5. Status post left total hip replacement arthroplasty with revision.  6. Right total knee replacement arthroplasty.   PLAN:  The patient will be admitted to [facility] to undergo  central decompressive lumbar laminectomy at L2 through L5.  Should we have  any medical problems, we will certainly ask Dr. Merlene Laughter or Dr.  Kerry Hough al. to follow along with [place] during this patient's  hospitalization.     [name].                 Marlowe Kays, M.D.    DLU/MEDQ  D:  03/30/2004  T:  03/30/2004  Job:  [id number]   cc:   Hal T. Stoneking, M.D.  [address]  [place], [place] [address]  Fax: [phone]   Tasia Catchings, M.D.  [address]  [place]  [place] [address]  Fax: [phone]

## 2011-03-22 ENCOUNTER — Encounter: Payer: Self-pay | Admitting: Podiatry

## 2011-09-18 DIAGNOSIS — R062 Wheezing: Secondary | ICD-10-CM | POA: Diagnosis not present

## 2011-10-03 DIAGNOSIS — L259 Unspecified contact dermatitis, unspecified cause: Secondary | ICD-10-CM | POA: Diagnosis not present

## 2011-10-03 DIAGNOSIS — L299 Pruritus, unspecified: Secondary | ICD-10-CM | POA: Diagnosis not present

## 2011-10-26 DIAGNOSIS — B351 Tinea unguium: Secondary | ICD-10-CM | POA: Diagnosis not present

## 2011-10-30 DIAGNOSIS — I10 Essential (primary) hypertension: Secondary | ICD-10-CM | POA: Diagnosis not present

## 2011-10-30 DIAGNOSIS — Z79899 Other long term (current) drug therapy: Secondary | ICD-10-CM | POA: Diagnosis not present

## 2011-10-30 DIAGNOSIS — E441 Mild protein-calorie malnutrition: Secondary | ICD-10-CM | POA: Diagnosis not present

## 2011-12-13 DIAGNOSIS — J309 Allergic rhinitis, unspecified: Secondary | ICD-10-CM | POA: Diagnosis not present

## 2011-12-13 DIAGNOSIS — I129 Hypertensive chronic kidney disease with stage 1 through stage 4 chronic kidney disease, or unspecified chronic kidney disease: Secondary | ICD-10-CM | POA: Diagnosis not present

## 2011-12-20 DIAGNOSIS — H353290 Exudative age-related macular degeneration, unspecified eye, stage unspecified: Secondary | ICD-10-CM | POA: Diagnosis not present

## 2011-12-20 DIAGNOSIS — H35059 Retinal neovascularization, unspecified, unspecified eye: Secondary | ICD-10-CM | POA: Diagnosis not present

## 2011-12-20 DIAGNOSIS — H4310 Vitreous hemorrhage, unspecified eye: Secondary | ICD-10-CM | POA: Diagnosis not present

## 2011-12-20 DIAGNOSIS — H43819 Vitreous degeneration, unspecified eye: Secondary | ICD-10-CM | POA: Diagnosis not present

## 2011-12-29 DIAGNOSIS — G5600 Carpal tunnel syndrome, unspecified upper limb: Secondary | ICD-10-CM | POA: Diagnosis not present

## 2012-02-12 DIAGNOSIS — H906 Mixed conductive and sensorineural hearing loss, bilateral: Secondary | ICD-10-CM | POA: Diagnosis not present

## 2012-02-21 DIAGNOSIS — H6520 Chronic serous otitis media, unspecified ear: Secondary | ICD-10-CM | POA: Diagnosis not present

## 2012-02-21 DIAGNOSIS — H908 Mixed conductive and sensorineural hearing loss, unspecified: Secondary | ICD-10-CM | POA: Diagnosis not present

## 2012-03-04 DIAGNOSIS — L738 Other specified follicular disorders: Secondary | ICD-10-CM | POA: Diagnosis not present

## 2012-03-04 DIAGNOSIS — L299 Pruritus, unspecified: Secondary | ICD-10-CM | POA: Diagnosis not present

## 2012-03-19 DIAGNOSIS — H906 Mixed conductive and sensorineural hearing loss, bilateral: Secondary | ICD-10-CM | POA: Diagnosis not present

## 2012-03-19 DIAGNOSIS — H6980 Other specified disorders of Eustachian tube, unspecified ear: Secondary | ICD-10-CM | POA: Diagnosis not present

## 2012-04-15 DIAGNOSIS — G5600 Carpal tunnel syndrome, unspecified upper limb: Secondary | ICD-10-CM | POA: Diagnosis not present

## 2012-04-16 DIAGNOSIS — G5600 Carpal tunnel syndrome, unspecified upper limb: Secondary | ICD-10-CM | POA: Diagnosis not present

## 2012-04-16 DIAGNOSIS — Z79899 Other long term (current) drug therapy: Secondary | ICD-10-CM | POA: Diagnosis not present

## 2012-04-16 DIAGNOSIS — Z1331 Encounter for screening for depression: Secondary | ICD-10-CM | POA: Diagnosis not present

## 2012-04-16 DIAGNOSIS — I10 Essential (primary) hypertension: Secondary | ICD-10-CM | POA: Diagnosis not present

## 2012-04-18 ENCOUNTER — Encounter (HOSPITAL_BASED_OUTPATIENT_CLINIC_OR_DEPARTMENT_OTHER): Payer: Self-pay | Admitting: *Deleted

## 2012-04-18 NOTE — Progress Notes (Signed)
[facility] brought pt here 2/12 for rt ctr-did well-will bring her in for ekg and bmet-will be here dos-and stay with her post op-pt has never had to see cardiology-sees dr Pete Glatter

## 2012-04-19 ENCOUNTER — Other Ambulatory Visit: Payer: Self-pay | Admitting: Orthopedic Surgery

## 2012-04-19 ENCOUNTER — Encounter (HOSPITAL_BASED_OUTPATIENT_CLINIC_OR_DEPARTMENT_OTHER)
Admission: RE | Admit: 2012-04-19 | Discharge: 2012-04-19 | Disposition: A | Discharge status: Home / Self Care | Payer: Medicare Other | Source: Ambulatory Visit | Attending: Orthopedic Surgery | Admitting: Orthopedic Surgery

## 2012-04-19 ENCOUNTER — Other Ambulatory Visit: Payer: Self-pay

## 2012-04-19 DIAGNOSIS — Z9849 Cataract extraction status, unspecified eye: Secondary | ICD-10-CM | POA: Diagnosis not present

## 2012-04-19 DIAGNOSIS — M1990 Unspecified osteoarthritis, unspecified site: Secondary | ICD-10-CM | POA: Diagnosis not present

## 2012-04-19 DIAGNOSIS — R030 Elevated blood-pressure reading, without diagnosis of hypertension: Secondary | ICD-10-CM | POA: Diagnosis not present

## 2012-04-19 DIAGNOSIS — M129 Arthropathy, unspecified: Secondary | ICD-10-CM | POA: Diagnosis not present

## 2012-04-19 DIAGNOSIS — Z96649 Presence of unspecified artificial hip joint: Secondary | ICD-10-CM | POA: Diagnosis not present

## 2012-04-19 DIAGNOSIS — Z9089 Acquired absence of other organs: Secondary | ICD-10-CM | POA: Diagnosis not present

## 2012-04-19 DIAGNOSIS — K219 Gastro-esophageal reflux disease without esophagitis: Secondary | ICD-10-CM | POA: Diagnosis not present

## 2012-04-19 DIAGNOSIS — G5600 Carpal tunnel syndrome, unspecified upper limb: Secondary | ICD-10-CM | POA: Diagnosis not present

## 2012-04-19 NOTE — Progress Notes (Signed)
Pt had labs drawn at private md.  Dr Chaney Malling waived new lab draw, will use labs from doctors offices

## 2012-04-23 ENCOUNTER — Encounter (HOSPITAL_BASED_OUTPATIENT_CLINIC_OR_DEPARTMENT_OTHER): Payer: Self-pay

## 2012-04-23 ENCOUNTER — Ambulatory Visit (HOSPITAL_BASED_OUTPATIENT_CLINIC_OR_DEPARTMENT_OTHER)
Admission: RE | Admit: 2012-04-23 | Discharge: 2012-04-23 | Disposition: A | Discharge status: Home Health | Payer: Medicare Other | Source: Ambulatory Visit | Attending: Orthopedic Surgery | Admitting: Orthopedic Surgery

## 2012-04-23 ENCOUNTER — Ambulatory Visit (HOSPITAL_BASED_OUTPATIENT_CLINIC_OR_DEPARTMENT_OTHER): Payer: Medicare Other | Admitting: Anesthesiology

## 2012-04-23 ENCOUNTER — Encounter (HOSPITAL_BASED_OUTPATIENT_CLINIC_OR_DEPARTMENT_OTHER)
Admission: RE | Disposition: A | Discharge status: Home Health | Payer: Self-pay | Source: Ambulatory Visit | Attending: Orthopedic Surgery

## 2012-04-23 ENCOUNTER — Encounter (HOSPITAL_BASED_OUTPATIENT_CLINIC_OR_DEPARTMENT_OTHER): Payer: Self-pay | Admitting: Anesthesiology

## 2012-04-23 DIAGNOSIS — M1990 Unspecified osteoarthritis, unspecified site: Secondary | ICD-10-CM | POA: Insufficient documentation

## 2012-04-23 DIAGNOSIS — M129 Arthropathy, unspecified: Secondary | ICD-10-CM | POA: Insufficient documentation

## 2012-04-23 DIAGNOSIS — R030 Elevated blood-pressure reading, without diagnosis of hypertension: Secondary | ICD-10-CM | POA: Diagnosis not present

## 2012-04-23 DIAGNOSIS — Z9849 Cataract extraction status, unspecified eye: Secondary | ICD-10-CM | POA: Insufficient documentation

## 2012-04-23 DIAGNOSIS — G5600 Carpal tunnel syndrome, unspecified upper limb: Secondary | ICD-10-CM | POA: Diagnosis not present

## 2012-04-23 DIAGNOSIS — Z96649 Presence of unspecified artificial hip joint: Secondary | ICD-10-CM | POA: Insufficient documentation

## 2012-04-23 DIAGNOSIS — Z9089 Acquired absence of other organs: Secondary | ICD-10-CM | POA: Insufficient documentation

## 2012-04-23 DIAGNOSIS — K219 Gastro-esophageal reflux disease without esophagitis: Secondary | ICD-10-CM | POA: Insufficient documentation

## 2012-04-23 HISTORY — DX: Carpal tunnel syndrome, left upper limb: G56.02

## 2012-04-23 HISTORY — PX: CARPAL TUNNEL RELEASE: SHX101

## 2012-04-23 HISTORY — DX: Presence of dental prosthetic device (complete) (partial): Z97.2

## 2012-04-23 HISTORY — DX: Gastro-esophageal reflux disease without esophagitis: K21.9

## 2012-04-23 HISTORY — DX: Headache: R51

## 2012-04-23 HISTORY — DX: Unspecified macular degeneration: H35.30

## 2012-04-23 HISTORY — DX: Presence of spectacles and contact lenses: Z97.3

## 2012-04-23 HISTORY — DX: Diverticulosis of intestine, part unspecified, without perforation or abscess without bleeding: K57.90

## 2012-04-23 HISTORY — DX: Unspecified osteoarthritis, unspecified site: M19.90

## 2012-04-23 HISTORY — DX: Presence of external hearing-aid: Z97.4

## 2012-04-23 LAB — POCT HEMOGLOBIN-HEMACUE: Hemoglobin: 14.5 g/dL (ref 12.0–15.0)

## 2012-04-23 SURGERY — CARPAL TUNNEL RELEASE
Anesthesia: Monitor Anesthesia Care | Site: Wrist | Laterality: Left | Wound class: Clean

## 2012-04-23 MED ORDER — CEFAZOLIN SODIUM-DEXTROSE 2-3 GM-% IV SOLR
INTRAVENOUS | Status: DC | PRN
  Administered 2012-04-23: 2 g via INTRAVENOUS

## 2012-04-23 MED ORDER — FENTANYL CITRATE 0.05 MG/ML IJ SOLN
INTRAMUSCULAR | Status: DC | PRN
  Administered 2012-04-23: 50 ug via INTRAVENOUS

## 2012-04-23 MED ORDER — OXYCODONE-ACETAMINOPHEN 5-325 MG PO TABS: 1.0000 | ORAL_TABLET | ORAL | Status: AC | PRN

## 2012-04-23 MED ORDER — PROMETHAZINE HCL 25 MG/ML IJ SOLN: 6.2500 mg | INTRAMUSCULAR | Status: DC | PRN

## 2012-04-23 MED ORDER — FENTANYL CITRATE 0.05 MG/ML IJ SOLN: 50.0000 ug | INTRAMUSCULAR | Status: DC | PRN

## 2012-04-23 MED ORDER — LIDOCAINE HCL (PF) 0.5 % IJ SOLN
INTRAMUSCULAR | Status: DC | PRN
  Administered 2012-04-23: 20 mL via INTRATHECAL

## 2012-04-23 MED ORDER — CHLORHEXIDINE GLUCONATE 4 % EX LIQD: 60.0000 mL | Freq: Once | CUTANEOUS | Status: DC

## 2012-04-23 MED ORDER — ONDANSETRON HCL 4 MG/2ML IJ SOLN
INTRAMUSCULAR | Status: DC | PRN
  Administered 2012-04-23: 4 mg via INTRAVENOUS

## 2012-04-23 MED ORDER — CEFAZOLIN SODIUM-DEXTROSE 2-3 GM-% IV SOLR: 2.0000 g | INTRAVENOUS | Status: DC

## 2012-04-23 MED ORDER — MIDAZOLAM HCL 5 MG/5ML IJ SOLN
INTRAMUSCULAR | Status: DC | PRN
  Administered 2012-04-23: 1 mg via INTRAVENOUS

## 2012-04-23 MED ORDER — PROPOFOL 10 MG/ML IV EMUL
INTRAVENOUS | Status: DC | PRN
  Administered 2012-04-23: 50 ug/kg/min via INTRAVENOUS

## 2012-04-23 MED ORDER — FENTANYL CITRATE 0.05 MG/ML IJ SOLN: 25.0000 ug | INTRAMUSCULAR | Status: DC | PRN

## 2012-04-23 MED ORDER — BUPIVACAINE HCL (PF) 0.25 % IJ SOLN
INTRAMUSCULAR | Status: DC | PRN
  Administered 2012-04-23: 5 mL

## 2012-04-23 MED ORDER — MIDAZOLAM HCL 2 MG/2ML IJ SOLN: 1.0000 mg | INTRAMUSCULAR | Status: DC | PRN

## 2012-04-23 MED ORDER — LACTATED RINGERS IV SOLN
INTRAVENOUS | Status: DC
  Administered 2012-04-23: 09:00:00 via INTRAVENOUS

## 2012-04-23 SURGICAL SUPPLY — 35 items
BANDAGE GAUZE ELAST BULKY 4 IN (GAUZE/BANDAGES/DRESSINGS) ×2 IMPLANT
BLADE SURG 15 STRL LF DISP TIS (BLADE) ×1 IMPLANT
BLADE SURG 15 STRL SS (BLADE) ×2
BNDG CMPR 9X4 STRL LF SNTH (GAUZE/BANDAGES/DRESSINGS)
BNDG COHESIVE 3X5 TAN STRL LF (GAUZE/BANDAGES/DRESSINGS) ×2 IMPLANT
BNDG ESMARK 4X9 LF (GAUZE/BANDAGES/DRESSINGS) IMPLANT
CHLORAPREP W/TINT 26ML (MISCELLANEOUS) ×2 IMPLANT
CLOTH BEACON ORANGE TIMEOUT ST (SAFETY) ×2 IMPLANT
CORDS BIPOLAR (ELECTRODE) ×2 IMPLANT
COVER MAYO STAND STRL (DRAPES) ×2 IMPLANT
COVER TABLE BACK 60X90 (DRAPES) ×2 IMPLANT
CUFF TOURNIQUET SINGLE 18IN (TOURNIQUET CUFF) ×2 IMPLANT
DRAPE EXTREMITY T 121X128X90 (DRAPE) ×2 IMPLANT
DRAPE SURG 17X23 STRL (DRAPES) ×2 IMPLANT
DRSG KUZMA FLUFF (GAUZE/BANDAGES/DRESSINGS) ×2 IMPLANT
GAUZE XEROFORM 1X8 LF (GAUZE/BANDAGES/DRESSINGS) ×2 IMPLANT
GLOVE BIO SURGEON STRL SZ 6.5 (GLOVE) ×2 IMPLANT
GLOVE SURG ORTHO 8.0 STRL STRW (GLOVE) ×2 IMPLANT
GOWN BRE IMP PREV XXLGXLNG (GOWN DISPOSABLE) ×2 IMPLANT
GOWN PREVENTION PLUS XLARGE (GOWN DISPOSABLE) ×2 IMPLANT
NEEDLE 27GAX1X1/2 (NEEDLE) ×1 IMPLANT
NS IRRIG 1000ML POUR BTL (IV SOLUTION) ×2 IMPLANT
PACK BASIN DAY SURGERY FS (CUSTOM PROCEDURE TRAY) ×2 IMPLANT
PAD CAST 3X4 CTTN HI CHSV (CAST SUPPLIES) ×1 IMPLANT
PADDING CAST ABS 4INX4YD NS (CAST SUPPLIES) ×1
PADDING CAST ABS COTTON 4X4 ST (CAST SUPPLIES) ×1 IMPLANT
PADDING CAST COTTON 3X4 STRL (CAST SUPPLIES) ×2
SPONGE GAUZE 4X4 12PLY (GAUZE/BANDAGES/DRESSINGS) ×2 IMPLANT
STOCKINETTE 4X48 STRL (DRAPES) ×2 IMPLANT
SUT VICRYL 4-0 PS2 18IN ABS (SUTURE) IMPLANT
SUT VICRYL RAPIDE 4/0 PS 2 (SUTURE) ×2 IMPLANT
SYR BULB 3OZ (MISCELLANEOUS) ×2 IMPLANT
SYR CONTROL 10ML LL (SYRINGE) ×1 IMPLANT
TOWEL OR 17X24 6PK STRL BLUE (TOWEL DISPOSABLE) ×2 IMPLANT
UNDERPAD 30X30 INCONTINENT (UNDERPADS AND DIAPERS) ×2 IMPLANT

## 2012-04-23 NOTE — Op Note (Signed)
Dictated number: [id number]

## 2012-04-23 NOTE — Anesthesia Postprocedure Evaluation (Signed)
  Anesthesia Post-op Note  Patient: [name]  Procedure(s) Performed: Procedure(s) (LRB): CARPAL TUNNEL RELEASE (Left)  Patient Location: PACU  Anesthesia Type: MAC and Regional  Level of Consciousness: awake and alert   Airway and Oxygen Therapy: Patient Spontanous Breathing  Post-op Pain: mild  Post-op Assessment: Post-op Vital signs reviewed, Patient's Cardiovascular Status Stable, Respiratory Function Stable, Patent Airway, No signs of Nausea or vomiting, Adequate PO intake and Pain level controlled  Post-op Vital Signs: stable  Complications: No apparent anesthesia complications

## 2012-04-23 NOTE — Brief Op Note (Signed)
04/23/2012  10:09 AM  PATIENT:  [name]  78 y.o. female  PRE-OPERATIVE DIAGNOSIS:  left carpal tunnel syndrome  POST-OPERATIVE DIAGNOSIS:  same as preop  PROCEDURE:  Procedure(s) (LRB): CARPAL TUNNEL RELEASE (Left)  SURGEON:  Surgeon(s) and Role:    * Nicki Reaper, MD - Primary  PHYSICIAN ASSISTANT:   ASSISTANTS: none   ANESTHESIA:   local and regional  EBL:  Total I/O In: 500 [I.V.:500] Out: -   BLOOD ADMINISTERED:none  DRAINS: none   LOCAL MEDICATIONS USED:  MARCAINE     SPECIMEN:  No Specimen  DISPOSITION OF SPECIMEN:  N/A  COUNTS:  YES  TOURNIQUET:   Total Tourniquet Time Documented: Forearm (Left) - 17 minutes  DICTATION: .Other Dictation: Dictation Number [id number]  PLAN OF CARE: Discharge to home after PACU  PATIENT DISPOSITION:  PACU - hemodynamically stable.

## 2012-04-23 NOTE — Anesthesia Preprocedure Evaluation (Signed)
Anesthesia Evaluation  Patient identified by MRN, date of birth, ID band Patient awake    Reviewed: Allergy & Precautions, H&P , NPO status , Patient's Chart, lab work & pertinent test results  Airway Mallampati: I TM Distance: >3 FB Neck ROM: Full    Dental   Pulmonary    Pulmonary exam normal       Cardiovascular hypertension,     Neuro/Psych  Headaches,  Neuromuscular disease    GI/Hepatic GERD-  ,  Endo/Other    Renal/GU      Musculoskeletal   Abdominal   Peds  Hematology   Anesthesia Other Findings   Reproductive/Obstetrics                           Anesthesia Physical Anesthesia Plan  ASA: II  Anesthesia Plan: MAC and Bier Block   Post-op Pain Management:    Induction:   Airway Management Planned: Simple Face Mask  Additional Equipment:   Intra-op Plan:   Post-operative Plan:   Informed Consent: I have reviewed the patients History and Physical, chart, labs and discussed the procedure including the risks, benefits and alternatives for the proposed anesthesia with the patient or authorized representative who has indicated his/her understanding and acceptance.     Plan Discussed with: CRNA and Surgeon  Anesthesia Plan Comments:         Anesthesia Quick Evaluation

## 2012-04-23 NOTE — H&P (Signed)
[name] is a 78 year-old right-hand dominant former patient who has not been seen in three years.  She comes in complaining of numbness and pain in her right greater than left hands.  This has been gradually increasing since last being seen.  She had nerve conductions done in 2007 revealing severe carpal tunnel syndrome bilaterally.  She has not had any treatment for this.  She has been taking Tylenol for pain.  She is occasionally awakened at night.  Her major complaint is numbness and tingling.  She has prior history of arthritis, elevation has helped.  Shaking her hands frequently helps also.  She has worn splints in the past.  [name] has had her right side treated surgically. She is complaining of her left side numbness, tingling and pain. She has a positive Tinel's, deep pressure over the carpal canal. She is wondering about surgical intervention. She would like to proceed. There is no history of diabetes.    PAST MEDICAL HISTORY:   She has no allergies. SURGICAL HISTORY:   Positive for hip replacement in 80 and 95; knee in 2000; repeat hip in 2006, back in 2004.   MEDICATIONS:   Ambien, blood pressure pill and Flexeril. FAMILY MEDICAL HISTORY:  Positive for high blood pressure and arthritis.   SOCIAL HISTORY:  She does not smoke or drink. She is a widow and retired.   REVIEW OF SYSTEMS:   Positive for high blood pressure and easy bruising, otherwise negative.  [name] is an 78 y.o. female.   Chief Complaint: CTS lt HPI: see above  Past Medical History  Diagnosis Date  . Arthritis   . Carpal tunnel syndrome on left   . DJD (degenerative joint disease)   . Diverticulosis   . Macular degeneration syndrome   . GERD (gastroesophageal reflux disease)   . Headache   . Wears glasses   . Wears hearing aid     both  . Wears partial dentures     top and bottom  . High blood pressure     echo 09-normal    Past Surgical History  Procedure Date  . Appendectomy   . Hip surgery      left  . Knee surgery   . Tonsillectomy   . Thyroid cyst excision   . Eye surgery     cataractas  . Carpal tunnel release 2/12    rt  . Joint replacement 2006    rt total hip x2    History reviewed. No pertinent family history. Social History:  reports that she has never smoked. She does not have any smokeless tobacco history on file. She reports that she drinks alcohol. Her drug history not on file.  Allergies:  Allergies  Allergen Reactions  . Morphine And Related   . Sulfa Antibiotics     Medications Prior to Admission  Medication Sig Dispense Refill  . acetaminophen (TYLENOL) 500 MG tablet Take 500 mg by mouth every 6 (six) hours as needed.      Marland Kitchen AmLODIPine Besylate (NORVASC PO) Take by mouth.        . calcium-vitamin D (OSCAL WITH D) 500-200 MG-UNIT per tablet Take 1 tablet by mouth daily.      . Celecoxib (CELEBREX PO) Take by mouth.        . cetirizine (ZYRTEC) 10 MG tablet Take 10 mg by mouth daily.      . cyclobenzaprine (FLEXERIL) 10 MG tablet Take 10 mg by mouth at bedtime as needed.      Marland Kitchen  Doxazosin Mesylate (CARDURA PO) Take by mouth.        . hydrOXYzine (ATARAX/VISTARIL) 25 MG tablet Take 25 mg by mouth at bedtime.      . moexipril (UNIVASC) 7.5 MG tablet Take 7.5 mg by mouth 2 (two) times daily before a meal.      . omeprazole (PRILOSEC) 20 MG capsule Take 20 mg by mouth every evening.      . potassium chloride SA (K-DUR,KLOR-CON) 20 MEQ tablet Take 20 mEq by mouth daily.      . vitamin B-12 (CYANOCOBALAMIN) 1000 MCG tablet Take 1,000 mcg by mouth daily.      Marland Kitchen zolpidem (AMBIEN) 10 MG tablet Take 10 mg by mouth at bedtime as needed.        Results for orders placed during the hospital encounter of 04/23/12 (from the past 48 hour(s))  POCT HEMOGLOBIN-HEMACUE     Status: Normal   Collection Time   04/23/12  8:51 AM      Component Value Range Comment   Hemoglobin 14.5  12.0 - 15.0 g/dL     No results found.   Pertinent items are noted in HPI.  Blood  pressure 134/83, pulse 113, temperature 98.1 F (36.7 C), temperature source Oral, resp. rate 18, height 5\' 2"  (1.575 m), weight 103 lb (46.72 kg), SpO2 96.00%.  General appearance: alert, cooperative and appears stated age Head: Normocephalic, without obvious abnormality Neck: no adenopathy Resp: clear to auscultation bilaterally Cardio: regular rate and rhythm, S1, S2 normal, no murmur, click, rub or gallop GI: soft, non-tender; bowel sounds normal; no masses,  no organomegaly Extremities: extremities normal, atraumatic, no cyanosis or edema Pulses: 2+ and symmetric Skin: Skin color, texture, turgor normal. No rashes or lesions Neurologic: Alert and oriented X 3, normal strength and tone. Normal symmetric reflexes. Normal coordination and gait Incision/Wound: na  Assessment/Plan CTS left Plan: CTR left  Paeton Studer R 04/23/2012, 9:18 AM

## 2012-04-23 NOTE — Transfer of Care (Signed)
Immediate Anesthesia Transfer of Care Note  Patient: [name]  Procedure(s) Performed: Procedure(s) (LRB): CARPAL TUNNEL RELEASE (Left)  Patient Location: PACU  Anesthesia Type: Bier block  Level of Consciousness: awake, alert  and oriented  Airway & Oxygen Therapy: Patient Spontanous Breathing and Patient connected to face mask oxygen  Post-op Assessment: Report given to PACU RN and Post -op Vital signs reviewed and stable  Post vital signs: Reviewed and stable  Complications: No apparent anesthesia complications

## 2012-04-23 NOTE — Anesthesia Procedure Notes (Signed)
Procedure Name: MAC Date/Time: 04/23/2012 9:36 AM Performed by: Burna Cash Pre-anesthesia Checklist: Patient identified, Emergency Drugs available, Suction available, Patient being monitored and Timeout performed Patient Re-evaluated:Patient Re-evaluated prior to inductionOxygen Delivery Method: Simple face mask

## 2012-04-24 ENCOUNTER — Encounter (HOSPITAL_BASED_OUTPATIENT_CLINIC_OR_DEPARTMENT_OTHER): Payer: Self-pay | Admitting: Orthopedic Surgery

## 2012-04-24 NOTE — Op Note (Signed)
[name], [name]               ACCOUNT NO.:  [id number]  MEDICAL RECORD NO.:  [id number]  LOCATION:                                 FACILITY:  PHYSICIAN:  Cindee Salt, M.D.            DATE OF BIRTH:  DATE OF PROCEDURE:  04/23/2012 DATE OF DISCHARGE:                              OPERATIVE REPORT   PREOPERATIVE DIAGNOSIS:  Carpal tunnel syndrome, left hand.  POSTOPERATIVE DIAGNOSIS:  Carpal tunnel syndrome, left hand.  OPERATION:  Decompression of left median nerve.  SURGEON:  Cindee Salt, MD  ANESTHESIA:  Forearm-based IV regional with local infiltration.  ANESTHESIOLOGIST:  Bedelia Person, MD  HISTORY:  The patient is a 78 year old female with history of bilateral carpal tunnel syndrome.  She has undergone release of her right side. She is admitted now for release of left.  Pre, peri and postoperative course have been discussed along with risks and complications.  She is aware that there is no guarantee with the surgery; possibility of infection; recurrence of injury to arteries, nerves, tendons; incomplete relief of symptoms and dystrophy.  In preoperative area, the patient is seen, the extremity marked by both the patient and surgeon, and antibiotic given.  PROCEDURE:  The patient was brought to the operating room where forearm- based IV regional anesthetic was carried out without difficulty.  She was prepped using ChloraPrep, supine position with the right arm free. A 3-minute dry time was allowed.  Time-out was taken, confirming the patient and procedure.  A longitudinal incision was made in the palm, carried down through the subcutaneous tissue.  Bleeders were electrocauterized.  Palmar fascia was split.  Superficial palmar arch identified.  Flexor tendon of the ring and little finger identified to the ulnar side of the median nerve.  Carpal retinaculum was incised with sharp dissection.  A right-angle and Sewall retractor were placed between the skin and forearm fascia.   The fascia was released for approximately 1.5 cm proximal to the wrist crease under direct vision. Canal was explored.  Area of compression to the nerve was immediately apparent with a very significant hourglass deformity.  No further lesions were identified.  The wound was irrigated.  Skin was closed with interrupted 4-0 Vicryl Rapide sutures.  Local infiltration with 0.25% Marcaine without epinephrine was given, 5 mL was used.  A sterile compressive dressing was applied with the fingers free.  On deflation of the tourniquet, all fingers were immediately pinked.  She was taken to the recovery room.          ______________________________ Cindee Salt, M.D.     GK/MEDQ  D:  04/23/2012  T:  04/24/2012  Job:  [id number]

## 2012-05-23 DIAGNOSIS — L253 Unspecified contact dermatitis due to other chemical products: Secondary | ICD-10-CM | POA: Diagnosis not present

## 2012-05-23 DIAGNOSIS — L570 Actinic keratosis: Secondary | ICD-10-CM | POA: Diagnosis not present

## 2012-05-23 DIAGNOSIS — L219 Seborrheic dermatitis, unspecified: Secondary | ICD-10-CM | POA: Diagnosis not present

## 2012-05-24 DIAGNOSIS — R634 Abnormal weight loss: Secondary | ICD-10-CM | POA: Diagnosis not present

## 2012-05-24 DIAGNOSIS — I129 Hypertensive chronic kidney disease with stage 1 through stage 4 chronic kidney disease, or unspecified chronic kidney disease: Secondary | ICD-10-CM | POA: Diagnosis not present

## 2012-05-24 DIAGNOSIS — J309 Allergic rhinitis, unspecified: Secondary | ICD-10-CM | POA: Diagnosis not present

## 2012-06-14 DIAGNOSIS — Z23 Encounter for immunization: Secondary | ICD-10-CM | POA: Diagnosis not present

## 2012-06-18 DIAGNOSIS — H7200 Central perforation of tympanic membrane, unspecified ear: Secondary | ICD-10-CM | POA: Diagnosis not present

## 2012-06-18 DIAGNOSIS — J342 Deviated nasal septum: Secondary | ICD-10-CM | POA: Diagnosis not present

## 2012-06-18 DIAGNOSIS — J310 Chronic rhinitis: Secondary | ICD-10-CM | POA: Diagnosis not present

## 2012-06-19 DIAGNOSIS — H43819 Vitreous degeneration, unspecified eye: Secondary | ICD-10-CM | POA: Diagnosis not present

## 2012-06-19 DIAGNOSIS — H353290 Exudative age-related macular degeneration, unspecified eye, stage unspecified: Secondary | ICD-10-CM | POA: Diagnosis not present

## 2012-06-19 DIAGNOSIS — H31019 Macula scars of posterior pole (postinflammatory) (post-traumatic), unspecified eye: Secondary | ICD-10-CM | POA: Diagnosis not present

## 2012-06-19 DIAGNOSIS — H35059 Retinal neovascularization, unspecified, unspecified eye: Secondary | ICD-10-CM | POA: Diagnosis not present

## 2012-06-25 DIAGNOSIS — R634 Abnormal weight loss: Secondary | ICD-10-CM | POA: Diagnosis not present

## 2012-06-25 DIAGNOSIS — I10 Essential (primary) hypertension: Secondary | ICD-10-CM | POA: Diagnosis not present

## 2012-06-28 DIAGNOSIS — L253 Unspecified contact dermatitis due to other chemical products: Secondary | ICD-10-CM | POA: Diagnosis not present

## 2012-06-28 DIAGNOSIS — Z85828 Personal history of other malignant neoplasm of skin: Secondary | ICD-10-CM | POA: Diagnosis not present

## 2012-07-19 DIAGNOSIS — R279 Unspecified lack of coordination: Secondary | ICD-10-CM | POA: Diagnosis not present

## 2012-07-19 DIAGNOSIS — R269 Unspecified abnormalities of gait and mobility: Secondary | ICD-10-CM | POA: Diagnosis not present

## 2012-08-22 DIAGNOSIS — H43819 Vitreous degeneration, unspecified eye: Secondary | ICD-10-CM | POA: Diagnosis not present

## 2012-08-22 DIAGNOSIS — H35059 Retinal neovascularization, unspecified, unspecified eye: Secondary | ICD-10-CM | POA: Diagnosis not present

## 2012-08-22 DIAGNOSIS — H31019 Macula scars of posterior pole (postinflammatory) (post-traumatic), unspecified eye: Secondary | ICD-10-CM | POA: Diagnosis not present

## 2012-08-22 DIAGNOSIS — H353290 Exudative age-related macular degeneration, unspecified eye, stage unspecified: Secondary | ICD-10-CM | POA: Diagnosis not present

## 2012-09-24 DIAGNOSIS — H35059 Retinal neovascularization, unspecified, unspecified eye: Secondary | ICD-10-CM | POA: Diagnosis not present

## 2012-09-24 DIAGNOSIS — H31019 Macula scars of posterior pole (postinflammatory) (post-traumatic), unspecified eye: Secondary | ICD-10-CM | POA: Diagnosis not present

## 2012-09-24 DIAGNOSIS — H43819 Vitreous degeneration, unspecified eye: Secondary | ICD-10-CM | POA: Diagnosis not present

## 2012-09-24 DIAGNOSIS — H353290 Exudative age-related macular degeneration, unspecified eye, stage unspecified: Secondary | ICD-10-CM | POA: Diagnosis not present

## 2012-10-22 ENCOUNTER — Ambulatory Visit: Payer: Medicare Other | Admitting: Occupational Therapy

## 2012-10-22 DIAGNOSIS — L821 Other seborrheic keratosis: Secondary | ICD-10-CM | POA: Diagnosis not present

## 2012-10-22 DIAGNOSIS — Z85828 Personal history of other malignant neoplasm of skin: Secondary | ICD-10-CM | POA: Diagnosis not present

## 2012-10-22 DIAGNOSIS — L219 Seborrheic dermatitis, unspecified: Secondary | ICD-10-CM | POA: Diagnosis not present

## 2012-10-25 DIAGNOSIS — Z0000 Encounter for general adult medical examination without abnormal findings: Secondary | ICD-10-CM | POA: Diagnosis not present

## 2012-10-25 DIAGNOSIS — Z1331 Encounter for screening for depression: Secondary | ICD-10-CM | POA: Diagnosis not present

## 2012-10-25 DIAGNOSIS — Z79899 Other long term (current) drug therapy: Secondary | ICD-10-CM | POA: Diagnosis not present

## 2012-11-06 ENCOUNTER — Ambulatory Visit: Payer: Medicare Other | Attending: Ophthalmology | Admitting: Occupational Therapy

## 2012-11-06 DIAGNOSIS — H53419 Scotoma involving central area, unspecified eye: Secondary | ICD-10-CM | POA: Insufficient documentation

## 2012-11-06 DIAGNOSIS — IMO0001 Reserved for inherently not codable concepts without codable children: Secondary | ICD-10-CM | POA: Insufficient documentation

## 2012-12-25 DIAGNOSIS — H43819 Vitreous degeneration, unspecified eye: Secondary | ICD-10-CM | POA: Diagnosis not present

## 2012-12-25 DIAGNOSIS — H353290 Exudative age-related macular degeneration, unspecified eye, stage unspecified: Secondary | ICD-10-CM | POA: Diagnosis not present

## 2012-12-25 DIAGNOSIS — H31019 Macula scars of posterior pole (postinflammatory) (post-traumatic), unspecified eye: Secondary | ICD-10-CM | POA: Diagnosis not present

## 2013-02-27 DIAGNOSIS — I10 Essential (primary) hypertension: Secondary | ICD-10-CM | POA: Diagnosis not present

## 2013-02-27 DIAGNOSIS — G479 Sleep disorder, unspecified: Secondary | ICD-10-CM | POA: Diagnosis not present

## 2013-02-27 DIAGNOSIS — R000 Tachycardia, unspecified: Secondary | ICD-10-CM | POA: Diagnosis not present

## 2013-03-30 ENCOUNTER — Encounter (HOSPITAL_COMMUNITY): Payer: Self-pay | Admitting: Emergency Medicine

## 2013-03-30 ENCOUNTER — Emergency Department (HOSPITAL_COMMUNITY)
Admission: EM | Admit: 2013-03-30 | Discharge: 2013-03-30 | Disposition: A | Discharge status: Home / Self Care | Payer: Medicare Other | Attending: Emergency Medicine | Admitting: Emergency Medicine

## 2013-03-30 ENCOUNTER — Emergency Department (HOSPITAL_COMMUNITY): Payer: Medicare Other

## 2013-03-30 DIAGNOSIS — Z8669 Personal history of other diseases of the nervous system and sense organs: Secondary | ICD-10-CM | POA: Insufficient documentation

## 2013-03-30 DIAGNOSIS — Z9889 Other specified postprocedural states: Secondary | ICD-10-CM | POA: Diagnosis not present

## 2013-03-30 DIAGNOSIS — S0101XA Laceration without foreign body of scalp, initial encounter: Secondary | ICD-10-CM

## 2013-03-30 DIAGNOSIS — S40029A Contusion of unspecified upper arm, initial encounter: Secondary | ICD-10-CM | POA: Insufficient documentation

## 2013-03-30 DIAGNOSIS — Z8739 Personal history of other diseases of the musculoskeletal system and connective tissue: Secondary | ICD-10-CM | POA: Diagnosis not present

## 2013-03-30 DIAGNOSIS — W010XXA Fall on same level from slipping, tripping and stumbling without subsequent striking against object, initial encounter: Secondary | ICD-10-CM | POA: Insufficient documentation

## 2013-03-30 DIAGNOSIS — Z8719 Personal history of other diseases of the digestive system: Secondary | ICD-10-CM | POA: Insufficient documentation

## 2013-03-30 DIAGNOSIS — S0100XA Unspecified open wound of scalp, initial encounter: Secondary | ICD-10-CM | POA: Diagnosis not present

## 2013-03-30 DIAGNOSIS — I10 Essential (primary) hypertension: Secondary | ICD-10-CM | POA: Insufficient documentation

## 2013-03-30 DIAGNOSIS — Y929 Unspecified place or not applicable: Secondary | ICD-10-CM | POA: Insufficient documentation

## 2013-03-30 DIAGNOSIS — S0990XA Unspecified injury of head, initial encounter: Secondary | ICD-10-CM | POA: Diagnosis not present

## 2013-03-30 DIAGNOSIS — K219 Gastro-esophageal reflux disease without esophagitis: Secondary | ICD-10-CM | POA: Diagnosis not present

## 2013-03-30 DIAGNOSIS — Z98811 Dental restoration status: Secondary | ICD-10-CM | POA: Diagnosis not present

## 2013-03-30 DIAGNOSIS — S199XXA Unspecified injury of neck, initial encounter: Secondary | ICD-10-CM | POA: Diagnosis not present

## 2013-03-30 DIAGNOSIS — Y9389 Activity, other specified: Secondary | ICD-10-CM | POA: Insufficient documentation

## 2013-03-30 DIAGNOSIS — S40022A Contusion of left upper arm, initial encounter: Secondary | ICD-10-CM

## 2013-03-30 DIAGNOSIS — S0190XA Unspecified open wound of unspecified part of head, initial encounter: Secondary | ICD-10-CM | POA: Diagnosis not present

## 2013-03-30 DIAGNOSIS — W19XXXA Unspecified fall, initial encounter: Secondary | ICD-10-CM

## 2013-03-30 DIAGNOSIS — Z79899 Other long term (current) drug therapy: Secondary | ICD-10-CM | POA: Diagnosis not present

## 2013-03-30 DIAGNOSIS — IMO0002 Reserved for concepts with insufficient information to code with codable children: Secondary | ICD-10-CM | POA: Diagnosis not present

## 2013-03-30 DIAGNOSIS — S5010XA Contusion of unspecified forearm, initial encounter: Secondary | ICD-10-CM | POA: Diagnosis not present

## 2013-03-30 NOTE — ED Provider Notes (Signed)
Medical screening examination/treatment/procedure(s) were performed by non-physician practitioner and as supervising physician I was immediately available for consultation/collaboration.  Jones Skene, M.D.   Jones Skene, MD 03/30/13 [phone]

## 2013-03-30 NOTE — ED Provider Notes (Signed)
CSN: [id number]     Arrival date & time 03/30/13  0146 History     First MD Initiated Contact with Patient 03/30/13 0215     Chief Complaint  Patient presents with  . Head Laceration   HPI  History provided by the patient and family. Patient arriving from [facility] after a fall. Patient reports getting up early this morning to use the restroom and stumbled on the way causing her to lose balance and fall backwards. Patient denies any LOC. She did have a small laceration to the back of her head. She was sore from a fall and press her emergency assist button and staff came to help her. She was able to be helped up and into the bathroom. She has some pain and soreness to her head. This is mild. No other treatments were provided. Denies any other aggravating or alleviating factors. No other associated symptoms. No chest pains or shortness of breath. No weakness or numbness in extremities. No confusion or dizziness.    Past Medical History  Diagnosis Date  . Arthritis   . Carpal tunnel syndrome on left   . DJD (degenerative joint disease)   . Diverticulosis   . Macular degeneration syndrome   . GERD (gastroesophageal reflux disease)   . Headache(784.0)   . Wears glasses   . Wears hearing aid     both  . Wears partial dentures     top and bottom  . High blood pressure     echo 09-normal   Past Surgical History  Procedure Laterality Date  . Appendectomy    . Hip surgery      left  . Knee surgery    . Tonsillectomy    . Thyroid cyst excision    . Eye surgery      cataractas  . Carpal tunnel release  2/12    rt  . Joint replacement  2006    rt total hip x2  . Carpal tunnel release  04/23/2012    Procedure: CARPAL TUNNEL RELEASE;  Surgeon: Nicki Reaper, MD;  Location: [facility];  Service: Orthopedics;  Laterality: Left;   History reviewed. No pertinent family history. History  Substance Use Topics  . Smoking status: Never Smoker   . Smokeless  tobacco: Not on file  . Alcohol Use: Yes   OB History   Grav Para Term Preterm Abortions TAB SAB Ect Mult Living                 Review of Systems  Respiratory: Negative for shortness of breath.   Cardiovascular: Negative for chest pain.  Neurological: Negative for weakness and numbness.  All other systems reviewed and are negative.    Allergies  Celebrex; Morphine and related; Vicodin; and Sulfa antibiotics  Home Medications   Current Outpatient Rx  Name  Route  Sig  Dispense  Refill  . acetaminophen (TYLENOL) 500 MG tablet   Oral   Take 500 mg by mouth 2 (two) times daily as needed for pain.          . beta carotene w/minerals (OCUVITE) tablet   Oral   Take 1 tablet by mouth 2 (two) times daily.         . calcium-vitamin D (OSCAL WITH D) 500-200 MG-UNIT per tablet   Oral   Take 1 tablet by mouth 2 (two) times daily.          . cetirizine (ZYRTEC) 10 MG tablet  Oral   Take 10 mg by mouth every morning.          . Melatonin 1 MG CAPS   Oral   Take 1 capsule by mouth at bedtime as needed (sleep).         . moexipril (UNIVASC) 15 MG tablet   Oral   Take 15 mg by mouth every evening.         Marland Kitchen omeprazole (PRILOSEC) 20 MG capsule   Oral   Take 20 mg by mouth every evening.         . polyethylene glycol (MIRALAX / GLYCOLAX) packet   Oral   Take 17 g by mouth every evening.          . vitamin B-12 (CYANOCOBALAMIN) 1000 MCG tablet   Oral   Take 1,000 mcg by mouth every morning.          . zolpidem (AMBIEN) 10 MG tablet   Oral   Take 10 mg by mouth at bedtime as needed for sleep.           BP 201/91  Pulse 98  Resp 18  SpO2 97% Physical Exam  Nursing note and vitals reviewed. Constitutional: She is oriented to person, place, and time. She appears well-developed and well-nourished. No distress.  HENT:  Head: Normocephalic.  Small laceration to left posterior scalp. No significant hematoma. No step-offs.  Eyes: EOM are normal.  Pupils are equal, round, and reactive to light.  Neck: Normal range of motion. Neck supple.  No cervical midline tenderness.  Cardiovascular: Normal rate and regular rhythm.   Pulmonary/Chest: Effort normal and breath sounds normal.  Abdominal: Soft. There is no tenderness.  Musculoskeletal: Normal range of motion.  Bruising and small hematoma over the left mid forearm. No pain along the radius Roma. No deformities. Normal range of motion of the joints. Normal distal pulses and strength in hands.  Several bruises on the extremities at different stages. Pelvis stable. No pain or deformities and lower extremities.  Neurological: She is alert and oriented to person, place, and time. She has normal strength. No cranial nerve deficit or sensory deficit.  Strength is equal in all extremities.  Skin: Skin is warm and dry. No rash noted.  Psychiatric: She has a normal mood and affect. Her behavior is normal.    ED Course   Procedures   LACERATION REPAIR Performed by: Angus Seller Authorized by: Angus Seller Consent: Verbal consent obtained. Risks and benefits: risks, benefits and alternatives were discussed Consent given by: patient Patient identity confirmed: provided demographic data Prepped and Draped in normal sterile fashion Wound explored  Laceration Location: Left posterior  Laceration Length: 1 cm  No Foreign Bodies seen or palpated  Anesthesia: None   Irrigation method: syringe Amount of cleaning: standard  Skin closure: Stapling   Number of staples: 1   Patient tolerance: Patient tolerated the procedure well with no immediate complications.     Ct Head Wo Contrast  03/30/2013   *RADIOLOGY REPORT*  Clinical Data:   Head laceration  CT HEAD WITHOUT CONTRAST CT CERVICAL SPINE WITHOUT CONTRAST  Technique:  Multidetector CT imaging of the head and cervical spine was performed following the standard protocol without intravenous contrast.  Multiplanar CT image  reconstructions of the cervical spine were also generated.  Comparison:   None  CT HEAD  Findings: Diffuse prominence of the CSF containing spaces was consistent with generalized atrophy. Scattered and confluent hypodensity within the periventricular white matter is consistent  with chronic small vessel ischemic changes.  No acute intracranial hemorrhage or infarct.  No mass lesion or midline shift.  No extra- axial fluid collection.  There is near complete opacification of both mastoid air cells. Partial opacification of the left sphenoid sinuses visualized. Prominent vascular calcifications are noted within the intracranial circulation.  IMPRESSION:  1.  Prominent diffuse atrophy with chronic small vessel ischemic changes.  No CT evidence of acute intracranial process identified. 2.  Small left parietal-occipital scalp contusion.  Intact calvarium. 3.  Bilateral mastoid effusions  CT CERVICAL SPINE  Findings: There is no acute fracture identified within the cervical spine.  No prevertebral soft tissue swelling.  Normal C1-2 articulations are intact.  Grade 1 anterolisthesis of C4-C5 appears chronic in nature.  Multilevel degenerative disc disease as evidenced by intervertebral disc space narrowing, endplate sclerosis, and osteophytosis is present, most prominent at C5-6 and C6-7.  Facet joints are normally aligned.  Partially visualized lung apices are clear.  IMPRESSION: 1.  No CT evidence of acute fracture or listhesis within the cervical spine. 2.  Multilevel degenerative disc disease as above, most severe at C5-6 and C6-7   Original Report Authenticated By: Rise Mu, M.D.   Ct Cervical Spine Wo Contrast  03/30/2013   *RADIOLOGY REPORT*  Clinical Data:   Head laceration  CT HEAD WITHOUT CONTRAST CT CERVICAL SPINE WITHOUT CONTRAST  Technique:  Multidetector CT imaging of the head and cervical spine was performed following the standard protocol without intravenous contrast.  Multiplanar CT image  reconstructions of the cervical spine were also generated.  Comparison:   None  CT HEAD  Findings: Diffuse prominence of the CSF containing spaces was consistent with generalized atrophy. Scattered and confluent hypodensity within the periventricular white matter is consistent with chronic small vessel ischemic changes.  No acute intracranial hemorrhage or infarct.  No mass lesion or midline shift.  No extra- axial fluid collection.  There is near complete opacification of both mastoid air cells. Partial opacification of the left sphenoid sinuses visualized. Prominent vascular calcifications are noted within the intracranial circulation.  IMPRESSION:  1.  Prominent diffuse atrophy with chronic small vessel ischemic changes.  No CT evidence of acute intracranial process identified. 2.  Small left parietal-occipital scalp contusion.  Intact calvarium. 3.  Bilateral mastoid effusions  CT CERVICAL SPINE  Findings: There is no acute fracture identified within the cervical spine.  No prevertebral soft tissue swelling.  Normal C1-2 articulations are intact.  Grade 1 anterolisthesis of C4-C5 appears chronic in nature.  Multilevel degenerative disc disease as evidenced by intervertebral disc space narrowing, endplate sclerosis, and osteophytosis is present, most prominent at C5-6 and C6-7.  Facet joints are normally aligned.  Partially visualized lung apices are clear.  IMPRESSION: 1.  No CT evidence of acute fracture or listhesis within the cervical spine. 2.  Multilevel degenerative disc disease as above, most severe at C5-6 and C6-7   Original Report Authenticated By: Rise Mu, M.D.   1. Fall, initial encounter   2. Scalp laceration, initial encounter   3. Hematoma of arm, left, initial encounter     MDM  Patient seen and evaluated. Patient appears well no acute distress. Does not appear in any significant discomfort. Small laceration to left posterior scalp.  CT scans unremarkable for any acute  injuries. Wound was cleaned and closed with a staple. Patient has been up walking with assistance from a walker to the restroom. No other concerns for injury from fall.  Angus Seller, PA-C 03/30/13 [phone]

## 2013-03-30 NOTE — ED Notes (Signed)
[id number]> Expected date:<BR> Expected time:<BR> Means of arrival:<BR> Comments:<BR> EMS 79yo F; fall, lac to head

## 2013-03-30 NOTE — ED Notes (Signed)
Patient fell at her appartment at [facility]. The patient has a small laceration to her head. The patient is ambulatory awake and alert and oriented.

## 2013-04-08 DIAGNOSIS — Z4802 Encounter for removal of sutures: Secondary | ICD-10-CM | POA: Diagnosis not present

## 2013-06-03 DIAGNOSIS — H31019 Macula scars of posterior pole (postinflammatory) (post-traumatic), unspecified eye: Secondary | ICD-10-CM | POA: Diagnosis not present

## 2013-06-03 DIAGNOSIS — H43819 Vitreous degeneration, unspecified eye: Secondary | ICD-10-CM | POA: Diagnosis not present

## 2013-06-03 DIAGNOSIS — H353290 Exudative age-related macular degeneration, unspecified eye, stage unspecified: Secondary | ICD-10-CM | POA: Diagnosis not present

## 2013-06-13 DIAGNOSIS — Z23 Encounter for immunization: Secondary | ICD-10-CM | POA: Diagnosis not present

## 2013-06-13 DIAGNOSIS — E441 Mild protein-calorie malnutrition: Secondary | ICD-10-CM | POA: Diagnosis not present

## 2013-06-13 DIAGNOSIS — Z79899 Other long term (current) drug therapy: Secondary | ICD-10-CM | POA: Diagnosis not present

## 2013-06-13 DIAGNOSIS — I10 Essential (primary) hypertension: Secondary | ICD-10-CM | POA: Diagnosis not present

## 2013-06-28 DIAGNOSIS — K922 Gastrointestinal hemorrhage, unspecified: Secondary | ICD-10-CM

## 2013-06-28 HISTORY — DX: Gastrointestinal hemorrhage, unspecified: K92.2

## 2013-06-29 ENCOUNTER — Encounter (HOSPITAL_COMMUNITY): Payer: Self-pay | Admitting: Emergency Medicine

## 2013-06-29 ENCOUNTER — Inpatient Hospital Stay (HOSPITAL_COMMUNITY)
Admission: EM | Admit: 2013-06-29 | Discharge: 2013-07-04 | DRG: 378 | Disposition: A | Discharge status: Skilled Nursing Facility | Payer: Medicare Other | Attending: Internal Medicine | Admitting: Internal Medicine

## 2013-06-29 DIAGNOSIS — Z833 Family history of diabetes mellitus: Secondary | ICD-10-CM

## 2013-06-29 DIAGNOSIS — H3530 Unspecified macular degeneration: Secondary | ICD-10-CM

## 2013-06-29 DIAGNOSIS — Z8249 Family history of ischemic heart disease and other diseases of the circulatory system: Secondary | ICD-10-CM

## 2013-06-29 DIAGNOSIS — K644 Residual hemorrhoidal skin tags: Secondary | ICD-10-CM | POA: Diagnosis present

## 2013-06-29 DIAGNOSIS — K5731 Diverticulosis of large intestine without perforation or abscess with bleeding: Principal | ICD-10-CM | POA: Diagnosis present

## 2013-06-29 DIAGNOSIS — E46 Unspecified protein-calorie malnutrition: Secondary | ICD-10-CM | POA: Diagnosis not present

## 2013-06-29 DIAGNOSIS — K219 Gastro-esophageal reflux disease without esophagitis: Secondary | ICD-10-CM | POA: Diagnosis not present

## 2013-06-29 DIAGNOSIS — R5381 Other malaise: Secondary | ICD-10-CM | POA: Diagnosis not present

## 2013-06-29 DIAGNOSIS — K5900 Constipation, unspecified: Secondary | ICD-10-CM | POA: Diagnosis present

## 2013-06-29 DIAGNOSIS — K922 Gastrointestinal hemorrhage, unspecified: Secondary | ICD-10-CM | POA: Diagnosis not present

## 2013-06-29 DIAGNOSIS — K921 Melena: Secondary | ICD-10-CM | POA: Diagnosis present

## 2013-06-29 DIAGNOSIS — F0390 Unspecified dementia without behavioral disturbance: Secondary | ICD-10-CM | POA: Diagnosis not present

## 2013-06-29 DIAGNOSIS — R7989 Other specified abnormal findings of blood chemistry: Secondary | ICD-10-CM | POA: Diagnosis present

## 2013-06-29 DIAGNOSIS — I498 Other specified cardiac arrhythmias: Secondary | ICD-10-CM | POA: Diagnosis not present

## 2013-06-29 DIAGNOSIS — I499 Cardiac arrhythmia, unspecified: Secondary | ICD-10-CM | POA: Diagnosis not present

## 2013-06-29 DIAGNOSIS — I10 Essential (primary) hypertension: Secondary | ICD-10-CM

## 2013-06-29 DIAGNOSIS — R55 Syncope and collapse: Secondary | ICD-10-CM | POA: Diagnosis present

## 2013-06-29 DIAGNOSIS — Z66 Do not resuscitate: Secondary | ICD-10-CM | POA: Diagnosis present

## 2013-06-29 DIAGNOSIS — E538 Deficiency of other specified B group vitamins: Secondary | ICD-10-CM

## 2013-06-29 DIAGNOSIS — Z681 Body mass index (BMI) 19 or less, adult: Secondary | ICD-10-CM

## 2013-06-29 DIAGNOSIS — R000 Tachycardia, unspecified: Secondary | ICD-10-CM

## 2013-06-29 DIAGNOSIS — R339 Retention of urine, unspecified: Secondary | ICD-10-CM

## 2013-06-29 DIAGNOSIS — D62 Acute posthemorrhagic anemia: Secondary | ICD-10-CM

## 2013-06-29 DIAGNOSIS — K625 Hemorrhage of anus and rectum: Secondary | ICD-10-CM | POA: Diagnosis not present

## 2013-06-29 HISTORY — DX: Essential (primary) hypertension: I10

## 2013-06-29 LAB — CBC WITH DIFFERENTIAL/PLATELET
Basophils Absolute: 0 10*3/uL (ref 0.0–0.1)
Basophils Relative: 0 % (ref 0–1)
Eosinophils Absolute: 0 10*3/uL (ref 0.0–0.7)
Eosinophils Relative: 0 % (ref 0–5)
HCT: 35.4 % — ABNORMAL LOW (ref 36.0–46.0)
Lymphocytes Relative: 17 % (ref 12–46)
Lymphs Abs: 2 10*3/uL (ref 0.7–4.0)
MCH: 31.5 pg (ref 26.0–34.0)
MCV: 94.4 fL (ref 78.0–100.0)
Monocytes Absolute: 0.5 10*3/uL (ref 0.1–1.0)
Neutro Abs: 9.7 10*3/uL — ABNORMAL HIGH (ref 1.7–7.7)
Neutrophils Relative %: 79 % — ABNORMAL HIGH (ref 43–77)
Platelets: 255 10*3/uL (ref 150–400)
RDW: 14 % (ref 11.5–15.5)
WBC: 12.3 10*3/uL — ABNORMAL HIGH (ref 4.0–10.5)

## 2013-06-29 LAB — BASIC METABOLIC PANEL
Chloride: 98 mEq/L (ref 96–112)
GFR calc Af Amer: 50 mL/min — ABNORMAL LOW (ref >90)
GFR calc non Af Amer: 43 mL/min — ABNORMAL LOW (ref >90)
Potassium: 4 mEq/L (ref 3.5–5.1)
Sodium: 136 mEq/L (ref 135–145)

## 2013-06-29 LAB — HEPATIC FUNCTION PANEL
AST: 21 U/L (ref 0–37)
Albumin: 3.3 g/dL — ABNORMAL LOW (ref 3.5–5.2)
Bilirubin, Direct: <0.1 mg/dL (ref 0.0–0.3)
Total Protein: 6 g/dL (ref 6.0–8.3)

## 2013-06-29 MED ORDER — LORATADINE 10 MG PO TABS
10.0000 mg | ORAL_TABLET | Freq: Every day | ORAL | Status: DC
  Administered 2013-06-30 – 2013-07-04 (×5): 10 mg via ORAL
  Filled 2013-06-29 (×5): qty 1

## 2013-06-29 MED ORDER — PANTOPRAZOLE SODIUM 40 MG PO TBEC
40.0000 mg | DELAYED_RELEASE_TABLET | Freq: Every day | ORAL | Status: DC
  Administered 2013-06-30 – 2013-07-04 (×5): 40 mg via ORAL
  Filled 2013-06-29 (×6): qty 1

## 2013-06-29 MED ORDER — SODIUM CHLORIDE 0.9 % IV SOLN
INTRAVENOUS | Status: AC
  Administered 2013-06-29: 75 mL/h via INTRAVENOUS

## 2013-06-29 MED ORDER — ACETAMINOPHEN 650 MG RE SUPP: 650.0000 mg | Freq: Four times a day (QID) | RECTAL | Status: DC | PRN

## 2013-06-29 MED ORDER — VITAMIN B-12 1000 MCG PO TABS
1000.0000 ug | ORAL_TABLET | Freq: Every morning | ORAL | Status: DC
  Administered 2013-06-30 – 2013-07-04 (×5): 1000 ug via ORAL
  Filled 2013-06-29 (×5): qty 1

## 2013-06-29 MED ORDER — SODIUM CHLORIDE 0.9 % IV SOLN: INTRAVENOUS | Status: DC

## 2013-06-29 MED ORDER — TRANDOLAPRIL 1 MG PO TABS
1.0000 mg | ORAL_TABLET | Freq: Every day | ORAL | Status: DC
  Administered 2013-06-29 – 2013-06-30 (×2): 1 mg via ORAL
  Filled 2013-06-29 (×2): qty 1

## 2013-06-29 MED ORDER — ZOLPIDEM TARTRATE 5 MG PO TABS
5.0000 mg | ORAL_TABLET | Freq: Every evening | ORAL | Status: DC | PRN
  Administered 2013-06-29 – 2013-06-30 (×2): 5 mg via ORAL
  Filled 2013-06-29 (×2): qty 1

## 2013-06-29 MED ORDER — SODIUM CHLORIDE 0.9 % IV BOLUS (SEPSIS)
500.0000 mL | Freq: Once | INTRAVENOUS | Status: AC
  Administered 2013-06-29 (×2): 500 mL via INTRAVENOUS

## 2013-06-29 MED ORDER — ZOLPIDEM TARTRATE 10 MG PO TABS: 10.0000 mg | ORAL_TABLET | Freq: Every evening | ORAL | Status: DC | PRN

## 2013-06-29 MED ORDER — ACETAMINOPHEN 325 MG PO TABS
650.0000 mg | ORAL_TABLET | Freq: Four times a day (QID) | ORAL | Status: DC | PRN
  Administered 2013-06-29 – 2013-07-02 (×5): 650 mg via ORAL
  Filled 2013-06-29 (×5): qty 2

## 2013-06-29 MED ORDER — POLYETHYLENE GLYCOL 3350 17 G PO PACK
17.0000 g | PACK | Freq: Every day | ORAL | Status: DC | PRN
  Filled 2013-06-29: qty 1

## 2013-06-29 MED ORDER — OCUVITE PO TABS
1.0000 | ORAL_TABLET | Freq: Two times a day (BID) | ORAL | Status: DC
  Administered 2013-06-29 – 2013-07-04 (×10): 1 via ORAL
  Filled 2013-06-29 (×12): qty 1

## 2013-06-29 NOTE — ED Provider Notes (Signed)
CSN: [id number]     Arrival date & time 06/29/13  1555 History   First MD Initiated Contact with Patient 06/29/13 1621     Chief Complaint  Patient presents with  . Rectal Bleeding    HPI  [name] is a 79 y.o. female with a PMH of arthritis, DJD, diverticulosis, macular degenerative syncope, GERD, headache, and HTN who presents to the ED for evaluation of rectal bleeding.  History was provided by the patient and the caregiver.  Caregiver states that [name] has been taking Miralax at night for constipation. [name] has been having diarrhea in the mornings and been "straining."  Today [name] had gross blood in [name] stool.  [name] also has been very tired today because [name] didn't sleep last night.  When asked how [name] feels [name] states [name] is tired.  [name] has no abdominal pain or rectal pain.  [name] is not on any blood thinners/anti-coagulation.  Patient has decreased appetite and intake at baseline. Patient has a history of dementia and appears to be at [name] baseline per [name] caregivers. No fevers, chest pain, shortness of breath, leg edema, headache, dizziness, or lightheadedness.  Patient takes Tylenol for pain and does not take any known NSAIDs. Previous abdominal surgeries include an appendectomy.     Past Medical History  Diagnosis Date  . Arthritis   . Carpal tunnel syndrome on left   . DJD (degenerative joint disease)   . Diverticulosis   . Macular degeneration syndrome   . GERD (gastroesophageal reflux disease)   . Headache(784.0)   . Wears glasses   . Wears hearing aid     both  . Wears partial dentures     top and bottom  . High blood pressure     echo 09-normal   Past Surgical History  Procedure Laterality Date  . Appendectomy    . Hip surgery      left  . Knee surgery    . Tonsillectomy    . Thyroid cyst excision    . Eye surgery      cataractas  . Carpal tunnel release  2/12    rt  . Joint replacement  2006    rt total hip x2  . Carpal tunnel release  04/23/2012     Procedure: CARPAL TUNNEL RELEASE;  Surgeon: Nicki Reaper, MD;  Location: [facility];  Service: Orthopedics;  Laterality: Left;   History reviewed. No pertinent family history. History  Substance Use Topics  . Smoking status: Never Smoker   . Smokeless tobacco: Not on file  . Alcohol Use: Yes   OB History   Grav Para Term Preterm Abortions TAB SAB Ect Mult Living                 Review of Systems  Constitutional: Positive for appetite change and fatigue. Negative for fever, chills and diaphoresis.  HENT: Negative for congestion, ear pain, rhinorrhea and sore throat.   Respiratory: Negative for cough and shortness of breath.   Cardiovascular: Negative for chest pain and leg swelling.  Gastrointestinal: Positive for diarrhea, constipation, blood in stool and anal bleeding. Negative for nausea, vomiting, abdominal pain and abdominal distention.  Genitourinary: Negative for dysuria.  Skin: Negative for wound.  Neurological: Negative for dizziness, weakness, light-headedness and headaches.    Allergies  Celebrex; Morphine and related; Vicodin; and Sulfa antibiotics  Home Medications   Current Outpatient Rx  Name  Route  Sig  Dispense  Refill  .  acetaminophen (TYLENOL) 500 MG tablet   Oral   Take 1,000 mg by mouth at bedtime.          . beta carotene w/minerals (OCUVITE) tablet   Oral   Take 1 tablet by mouth 2 (two) times daily.         . Calcium Carb-Cholecalciferol (CALCIUM 600 + D PO)   Oral   Take 1 tablet by mouth 2 (two) times daily.         . cetirizine (ZYRTEC) 10 MG tablet   Oral   Take 10 mg by mouth every morning.          . cyclobenzaprine (FLEXERIL) 10 MG tablet   Oral   Take 10 mg by mouth every evening.         . moexipril (UNIVASC) 15 MG tablet   Oral   Take 15 mg by mouth every evening.         Marland Kitchen omeprazole (PRILOSEC) 20 MG capsule   Oral   Take 20 mg by mouth every evening.         . polyethylene glycol (MIRALAX  / GLYCOLAX) packet   Oral   Take 17 g by mouth every evening.          . vitamin B-12 (CYANOCOBALAMIN) 1000 MCG tablet   Oral   Take 1,000 mcg by mouth every morning.          . zolpidem (AMBIEN) 10 MG tablet   Oral   Take 10 mg by mouth at bedtime as needed for sleep.           BP 113/80  Pulse 108  Temp(Src) 97.3 F (36.3 C) (Oral)  Resp 20  SpO2 96%  Filed Vitals:   06/29/13 1736 06/29/13 1928 06/29/13 1929 06/29/13 2015  BP: 100/57 107/66 107/66 122/77  Pulse: 119 100 103 114  Temp:    98.1 F (36.7 C)  TempSrc:    Oral  Resp:   22 18  Height:    5\' 2"  (1.575 m)  SpO2:  96% 99% 99%    Physical Exam  Nursing note and vitals reviewed. Constitutional: [name] is oriented to person, place, and time. [name] appears well-developed and well-nourished. No distress.  HENT:  Head: Normocephalic and atraumatic.  Right Ear: External ear normal.  Left Ear: External ear normal.  Nose: Nose normal.  Mouth/Throat: Oropharynx is clear and moist.  Eyes: Conjunctivae are normal. Right eye exhibits no discharge. Left eye exhibits no discharge.  Neck: Normal range of motion. Neck supple.  Cardiovascular: Regular rhythm, normal heart sounds and intact distal pulses.  Exam reveals no gallop and no friction rub.   No murmur heard. Tachycardic  Pulmonary/Chest: Effort normal and breath sounds normal. No respiratory distress. [name] has no wheezes. [name] has no rales. [name] exhibits no tenderness.  Abdominal: Soft. Bowel sounds are normal. [name] exhibits no distension and no mass. There is no tenderness. There is no rebound and no guarding.  Surgical scar present on the abdomen  Genitourinary: Guaiac positive stool.  Gross blood on rectal exam.  No palpable stool.  1 cm x 1 cm soft flesh colored external hemorrhoid to the 3:00 position on the anus.  Erythema and skin irritation to the superior buttocks.    Musculoskeletal: Normal range of motion. [name] exhibits no edema and no tenderness.   Neurological: [name] is alert and oriented to person, place, and time.  Skin: Skin is warm and dry. [name] is not diaphoretic.  ED Course  Procedures (including critical care time) Labs Review Labs Reviewed - No data to display Imaging Review No results found.  EKG Interpretation     Ventricular Rate:    PR Interval:    QRS Duration:   QT Interval:    QTC Calculation:   R Axis:     Text Interpretation:             Results for orders placed during the hospital encounter of 06/29/13  CBC WITH DIFFERENTIAL      Result Value Range   WBC 12.3 (*) 4.0 - 10.5 K/uL   RBC 3.75 (*) 3.87 - 5.11 MIL/uL   Hemoglobin 11.8 (*) 12.0 - 15.0 g/dL   HCT 14.7 (*) 82.9 - 56.2 %   MCV 94.4  78.0 - 100.0 fL   MCH 31.5  26.0 - 34.0 pg   MCHC 33.3  30.0 - 36.0 g/dL   RDW 13.0  86.5 - 78.4 %   Platelets 255  150 - 400 K/uL   Neutrophils Relative % 79 (*) 43 - 77 %   Neutro Abs 9.7 (*) 1.7 - 7.7 K/uL   Lymphocytes Relative 17  12 - 46 %   Lymphs Abs 2.0  0.7 - 4.0 K/uL   Monocytes Relative 4  3 - 12 %   Monocytes Absolute 0.5  0.1 - 1.0 K/uL   Eosinophils Relative 0  0 - 5 %   Eosinophils Absolute 0.0  0.0 - 0.7 K/uL   Basophils Relative 0  0 - 1 %   Basophils Absolute 0.0  0.0 - 0.1 K/uL  BASIC METABOLIC PANEL      Result Value Range   Sodium 136  135 - 145 mEq/L   Potassium 4.0  3.5 - 5.1 mEq/L   Chloride 98  96 - 112 mEq/L   CO2 29  19 - 32 mEq/L   Glucose, Bld 129 (*) 70 - 99 mg/dL   BUN 30 (*) 6 - 23 mg/dL   Creatinine, Ser 6.96  0.50 - 1.10 mg/dL   Calcium 29.5 (*) 8.4 - 10.5 mg/dL   GFR calc non Af Amer 43 (*) >90 mL/min   GFR calc Af Amer 50 (*) >90 mL/min  HEPATIC FUNCTION PANEL      Result Value Range   Total Protein 6.0  6.0 - 8.3 g/dL   Albumin 3.3 (*) 3.5 - 5.2 g/dL   AST 21  0 - 37 U/L   ALT 10  0 - 35 U/L   Alkaline Phosphatase 70  39 - 117 U/L   Total Bilirubin 0.2 (*) 0.3 - 1.2 mg/dL   Bilirubin, Direct <2.8  0.0 - 0.3 mg/dL   Indirect Bilirubin NOT  CALCULATED  0.3 - 0.9 mg/dL  OCCULT BLOOD, POC DEVICE      Result Value Range   Fecal Occult Bld POSITIVE (*) NEGATIVE  TYPE AND SCREEN      Result Value Range   ABO/RH(D) O POS     Antibody Screen POS     Sample Expiration 07/02/2013     Antibody Identification ANTI-M     DAT, IgG NEG     Unit Number [id number]     Blood Component Type RED CELLS,LR     Unit division 00     Status of Unit ALLOCATED     Transfusion Status OK TO TRANSFUSE     Crossmatch Result COMPATIBLE     Unit Number [id number]     Blood Component  Type RED CELLS,LR     Unit division 00     Status of Unit ALLOCATED     Transfusion Status OK TO TRANSFUSE     Crossmatch Result COMPATIBLE      Date: 06/29/2013  Rate: 96  Rhythm: normal sinus rhythm  QRS Axis: left  Intervals: normal  ST/T Wave abnormalities: normal  Conduction Disutrbances:none  Narrative Interpretation:   Old EKG Reviewed: unchanged   MDM  No diagnosis found.   ELLINOR TEST is a 79 y.o. female with a PMH of arthritis, DJD, diverticulosis, macular degenerative syncope, GERD, headache, and HTN who presents to the ED for evaluation of rectal bleeding.  CBC, BMP, type and screen, and occult blood stool ordered.  Rechecks  5:45 PM = had a syncopal episode during orthostatic vitals.  Fluids started.  EKG performed.    Consults  6:36 PM = Spoke with Dr. Clent Ridges who will come down and speak with the patient.       Patient being admitted for evaluation of a GI bleed.  Patient had gross blood on rectal exam.  Abdominal exam benign and patient asymptomatic.  [name] H&H are stable at this time.  Source of bleed is unclear.  [name] had a brief syncopal episode during [name] ED visit and is back to baseline.    Final impressions: 1. GI bleeding  2. Syncope     Luiz Iron PA-C   This patient was discussed with Dr. Harrel Lemon, PA-C 06/30/13 1410

## 2013-06-29 NOTE — ED Notes (Signed)
Report given to Karen, RN.  All questions answered

## 2013-06-29 NOTE — Progress Notes (Signed)
Pt admitted to floor at 2010 and had one 100 cc bloody liquid stool, bp 122/77 hr is 114, denies abd pain - does c/o chronic bil le pain, given tylenol.  Is NPO and does not have IVF ordered.  Have contacted Lenny Pastel who is on call and we are going to start IVF and check HGB at ooo1.  Will continue to monitor

## 2013-06-29 NOTE — ED Provider Notes (Signed)
Medical screening examination/treatment/procedure(s) were conducted as a shared visit with non-physician practitioner(s) and myself.  I personally evaluated the patient during the encounter.  EKG Interpretation   None      79 yo female with GI bleeding.  On my exam, well appearing, no distress, abdomen soft and nontender.  Per PA Palmer's exam, stool was not melanic.   5:44 PM Pt had a syncopal episode during orthostatic testing.  Recovered without intervention.    Plan admission.    Clinical Impression: 1. GI bleed     Cheryl Churn, MD 06/30/13 0006

## 2013-06-29 NOTE — ED Notes (Signed)
Bed: UJ81 Expected date:  Expected time:  Means of arrival:  Comments: 79 y/o F rectal bleed

## 2013-06-29 NOTE — ED Notes (Signed)
Assumed care of patient s/p report Patient posted for admission bed Patient denies complaints or needs at this time Side rails up, call bell in reach

## 2013-06-29 NOTE — ED Notes (Signed)
PA at bedside.

## 2013-06-29 NOTE — ED Notes (Addendum)
Pt from [facility] via [facility] c/o possible rectal bleeding and fatigue starting today. Caregiver at the facility saw bright red blood in toilet. Pt has HX of hemorrhoids. Pt has HX of dementia. Pt is alert to situation, place and person. Per EMS this is pt's baseline.

## 2013-06-29 NOTE — H&P (Signed)
[facility] History and Physical  [name] QIO:[id number] DOB: [birth year]    PCP:   Dr. Merlene Laughter  Chief Complaint: rectal bleeding  HPI: [name] is an 79 y.o. female with past medical history of constipation for which she takes miralax daily presents with bright red blood per rectum which started this afternoon.  She has dementia, the history is given by her care givers.  They state that [name] has for several months had very low PO intake, and has had the sensation of incomplete emptying of the bowels. Despite taking miralax daily she strains to have bowel movements.  This afternoon she noted blood on the toilet paper, frank blood in the toilet and then continued to have bleeding from the rectum even after moving her bowels.  She has not complained of pain in the abdomen or rectal area.  She has been having daily small bowel movements.  She has not had any nausea or vomiting.  At this time there is dried blood around the rectal area, but no current bleeding.  Of note, she has also had two possible syncopal events today.  The first was this afternoon while being assessed by a home health nurse she seemed to loose consciousness.  The second occurred in the ED while nursing was checking orthostatic vital signs she seemed to loose consciousness when standing from seated position.  She denies any current dizziness, lightheadedness, headache.  Rewiew of Systems:  Constitutional: Negative for malaise, fever and chills. Is very thin, caregivers report this is stable Eyes: no change in vision or eye pain ENMT: no ear or throat pain, no congestion,  Cardiovascular: Negative for chest pain, palpitations, diaphoresis, dyspnea and peripheral edema. ; No orthopnea, PND Respiratory: Negative for cough, hemoptysis, wheezing or shortness of breath Gastrointestinal: Negative for nausea, vomiting, abdominal pain, rest as above Genitourinary: Negative for frequency, dysuria,  incontinence,flank pain and hematuria; Musculoskeletal: no swollen or tender joints Skin: . Some bruises over the LE's stable, no rash Neuro: as above- possible syncopal events, otherwise stable Psych: negative for anxiety, depression, panic attacks, hallucinations, paranoia, suicidal or homicidal ideation   Past Medical History  Diagnosis Date  . Arthritis   . Carpal tunnel syndrome on left   . DJD (degenerative joint disease)   . Diverticulosis   . Macular degeneration syndrome   . GERD (gastroesophageal reflux disease)   . Headache(784.0)   . Wears glasses   . Wears hearing aid     both  . Wears partial dentures     top and bottom  . High blood pressure     echo 09-normal    Past Surgical History  Procedure Laterality Date  . Appendectomy    . Hip surgery      left  . Knee surgery    . Tonsillectomy    . Thyroid cyst excision    . Eye surgery      cataractas  . Carpal tunnel release  2/12    rt  . Joint replacement  2006    rt total hip x2  . Carpal tunnel release  04/23/2012    Procedure: CARPAL TUNNEL RELEASE;  Surgeon: Nicki Reaper, MD;  Location: [facility];  Service: Orthopedics;  Laterality: Left;    Medications:  HOME MEDS: Prior to Admission medications   Medication Sig Start Date End Date Taking? Authorizing Provider  acetaminophen (TYLENOL) 500 MG tablet Take 1,000 mg by mouth at bedtime.    Yes Historical  Provider, MD  amLODipine (NORVASC) 2.5 MG tablet Take 2.5 mg by mouth daily.   Yes Historical Provider, MD  beta carotene w/minerals (OCUVITE) tablet Take 1 tablet by mouth 2 (two) times daily.   Yes Historical Provider, MD  Calcium Carb-Cholecalciferol (CALCIUM 600 + D PO) Take 1 tablet by mouth 2 (two) times daily.   Yes Historical Provider, MD  cetirizine (ZYRTEC) 10 MG tablet Take 10 mg by mouth every morning.    Yes Historical Provider, MD  moexipril (UNIVASC) 15 MG tablet Take 15 mg by mouth every evening.   Yes Historical  Provider, MD  omeprazole (PRILOSEC) 20 MG capsule Take 20 mg by mouth daily with lunch.    Yes Historical Provider, MD  polyethylene glycol (MIRALAX / GLYCOLAX) packet Take 17 g by mouth every evening.    Yes Historical Provider, MD  vitamin B-12 (CYANOCOBALAMIN) 1000 MCG tablet Take 1,000 mcg by mouth every morning.    Yes Historical Provider, MD  zolpidem (AMBIEN) 10 MG tablet Take 10 mg by mouth at bedtime as needed for sleep.    Yes Historical Provider, MD     Allergies:  Allergies  Allergen Reactions  . Celebrex [Celecoxib] Itching  . Morphine And Related Nausea Only  . Vicodin [Hydrocodone-Acetaminophen] Itching  . Sulfa Antibiotics Rash    Social History:  Widowed, retired. On daughter lives in [place].  Lives in a private residence in [place].  Has multiple caregivers. Reports that she has never smoked. She does not have any smokeless tobacco history on file. She reports that she drinks alcohol. Her drug history is not on file.  Family History: Positive for heart disease in her mother, who died in 55.  Father was diabetic and died at 35.    Physical Exam: Filed Vitals:   06/29/13 1608 06/29/13 1729 06/29/13 1731 06/29/13 1736  BP: 113/80 99/64 112/62 100/57  Pulse: 108 102 112 119  Temp: 97.3 F (36.3 C)     TempSrc: Oral     Resp: 20     SpO2: 96%      Blood pressure 100/57, pulse 119, temperature 97.3 F (36.3 C), temperature source Oral, resp. rate 20, SpO2 96.00%.  GEN:  Pleasant , no distress PSYCH:  Alert, not anxious, would like to go home HEENT: Mucous membranes pink and anicteric; PERRLA; EOM intact; no cervical lymphadenopathy nor thyromegaly or carotid bruit; no JVD; neck supple CHEST: Normal respiration, clear to auscultation bilaterally.  HEART: irregular tachycardic.  There are no murmur, rub, or gallops.   ABDOMEN: soft and non-tender; no masses, no organomegaly, normal abdominal bowel sounds;  Rectal Exam: caked blood, external  Hemorid, no pain with examination EXTREMITIES: ; age-appropriate arthropathy of the hands and knees; no edema; no ulcerations.   PULSES: 1+ and symmetric CNS: right pupil larger due to surgery, both reactive, CN's 2-12 grossly intact, strength symmetric, no focal defects, oriented to person and place, not date  Labs on Admission:  Basic Metabolic Panel:  Recent Labs Lab 06/29/13 1724  NA 136  K 4.0  CL 98  CO2 29  GLUCOSE 129*  BUN 30*  CREATININE 1.07  CALCIUM 11.2*   Liver Function Tests: No results found for this basename: AST, ALT, ALKPHOS, BILITOT, PROT, ALBUMIN,  in the last 168 hours No results found for this basename: LIPASE, AMYLASE,  in the last 168 hours No results found for this basename: AMMONIA,  in the last 168 hours CBC:  Recent Labs Lab 06/29/13 1724  WBC  12.3*  NEUTROABS 9.7*  HGB 11.8*  HCT 35.4*  MCV 94.4  PLT 255   Cardiac Enzymes: No results found for this basename: CKTOTAL, CKMB, CKMBINDEX, TROPONINI,  in the last 168 hours  CBG: No results found for this basename: GLUCAP,  in the last 168 hours   Radiological Exams on Admission: No results found.  EKG: Independently reviewed. Nsr, lvh   Assessment/Plan 1) painless lower gi bleed:  Likely diverticular or Hemoridal bleeding.  I do not see a colonoscopy report on file, but there is mention of diverticulosis.  At this point she does not seem to be actively bleeding.  Hemoglobin is robust.  Blood pressure is stable.  Will admit to the floor for observation.  If active bleeding develops could consider sigmoidoscopy, though caregivers (one of whom is one of the POA's) are clear that they would be hesitant to have any invasive procedures.  Has been typed and screened.  Will be NPO over night in case of procedure. I have not consulted GI on this patient at this time. 2) syncopal events: likely due to #1.  Will monitor on telemetry.  Recheck orthostatics in am. 3) hypertension: hold amlodipine  4)  dementia: at baseline 5) GERD: PPI 6) malnutrition: continue with ensure once diet resumes 7) DNR- discussed with caregivers.  This is already in place. 8) dispo: admit to tele/obs   Code Status: DNR   Elby Showers, MD. [facility] Pager [phone] 7pm to 7am.  06/29/2013, 7:13 PM

## 2013-06-30 LAB — CBC WITH DIFFERENTIAL/PLATELET
Basophils Absolute: 0 10*3/uL (ref 0.0–0.1)
Eosinophils Absolute: 0 10*3/uL (ref 0.0–0.7)
Eosinophils Relative: 0 % (ref 0–5)
HCT: 24.8 % — ABNORMAL LOW (ref 36.0–46.0)
Lymphocytes Relative: 31 % (ref 12–46)
MCH: 31.4 pg (ref 26.0–34.0)
MCHC: 33.1 g/dL (ref 30.0–36.0)
MCV: 95 fL (ref 78.0–100.0)
Monocytes Absolute: 0.3 10*3/uL (ref 0.1–1.0)
Monocytes Relative: 3 % (ref 3–12)
Platelets: 222 10*3/uL (ref 150–400)
RDW: 14.4 % (ref 11.5–15.5)
WBC: 8.3 10*3/uL (ref 4.0–10.5)

## 2013-06-30 LAB — CBC
MCHC: 32.7 g/dL (ref 30.0–36.0)
MCV: 95 fL (ref 78.0–100.0)
Platelets: 248 10*3/uL (ref 150–400)
RBC: 2.99 MIL/uL — ABNORMAL LOW (ref 3.87–5.11)
RDW: 14.4 % (ref 11.5–15.5)

## 2013-06-30 LAB — HEMOGLOBIN AND HEMATOCRIT, BLOOD
HCT: 23.5 % — ABNORMAL LOW (ref 36.0–46.0)
HCT: 29.7 % — ABNORMAL LOW (ref 36.0–46.0)
Hemoglobin: 8 g/dL — ABNORMAL LOW (ref 12.0–15.0)

## 2013-06-30 LAB — BASIC METABOLIC PANEL
CO2: 27 mEq/L (ref 19–32)
Chloride: 104 mEq/L (ref 96–112)
GFR calc Af Amer: 57 mL/min — ABNORMAL LOW (ref >90)
GFR calc non Af Amer: 49 mL/min — ABNORMAL LOW (ref >90)
Potassium: 4.1 mEq/L (ref 3.5–5.1)
Sodium: 137 mEq/L (ref 135–145)

## 2013-06-30 MED ORDER — SODIUM CHLORIDE 0.9 % IV SOLN
INTRAVENOUS | Status: AC
  Administered 2013-06-30: 11:00:00 via INTRAVENOUS

## 2013-06-30 MED ORDER — SODIUM CHLORIDE 0.9 % IV BOLUS (SEPSIS)
250.0000 mL | Freq: Once | INTRAVENOUS | Status: AC
Start: 2013-06-30 — End: 2013-06-30
  Administered 2013-06-30: 17:00:00 250 mL via INTRAVENOUS

## 2013-06-30 NOTE — Progress Notes (Signed)
Current order is for pt to be a "full code".  Pt does have an out of facility DNR in chart.

## 2013-06-30 NOTE — Progress Notes (Signed)
[facility] progress note. Chief complaint. Rectal bleeding. History of present illness. This 79 year old female was admitted to hospital with bright red blood per rectum. No active bleeding was noted during her emergency room stay the patient was typed and screened. Her hemoglobin on admission was 11.8. The patient had a bloody bowel movement with arrival on the floor and I checked a hemoglobin which remained roughly stable at 10.1. Nursing now unfortunately the patient has had 3 more bloody bowel movements and fairly rapid succession with hemoglobin per morning labs 9.3. The patient's vital signs remains stable but while sitting on the commode she did have a event consistent with presyncope. I came up to see the patient at bedside and found her in bed, alert, and in no distress. She does not look particularly anemic and has no current complaints. Of note she does have dementia and is a rather poor historian. Vital signs. Temperature 97.9, pulse 102, respiration 20, blood pressure 131/82. O2 sats 99%. General appearance. Frail elderly female who is alert, cooperative, and in no distress. Cardiac. Rate and rhythm primarily regular with occasional irregular beat. Lungs. Breath sounds are clear and equal. Abdomen. Soft with positive bowel sounds. No pain with palpation. Impression/plan. Problem #1. Bright red blood per rectum. Patient's continued the bleeding and dropping hemoglobin concerning. I have ordered hemoglobin and hematocrit checks every 6 hours beginning at 11 AM. I've contacted [facility] gastroenterology and they will see the patient this morning as well. It's unclear to me exactly how aggressive power of attorney will want to be in regards to colonoscopy and transfusion and I had no luck in contacting the responsible POA. Patient has already been typed and screened and I would defer transfusion at this point with a hemoglobin of 9.3. Problem #2. CODE STATUS. Admitting physician clearly  documented that CODE STATUS was discussed and patient should be DO NOT RESUSCITATE. The patient to however is listed full code in the electronic record and I will change this to DO NOT RESUSCITATE as listed in the admitting history and physical.

## 2013-06-30 NOTE — Progress Notes (Signed)
Pt has another medium to large size bloody stool c clots noted.  Vss, does endorse chronic back pain for which I have given her tylenol c relief.

## 2013-06-30 NOTE — Progress Notes (Signed)
Pt had a second small liquid bloody stool just prior to midnight and her HGB at MN is down to 10.1.  Vss, no c/o.  Will continue to monitor

## 2013-06-30 NOTE — Progress Notes (Signed)
Just had 4th bloody stool since admission 9 hours ago.  This last one was large approx 750 cc dark red liquid.  After having stool pt had 15 second syncopal episode while sitting on BSC - stlight slump to right and unresponsive.  Then back to normal.  vss at this time are 131/82, 101,99% on RA, rr 20.  Pt helped back to bed and Lenny Pastel on call and notified.  CBC which had been taken 10 minutes prior to this episode is being run stat.  Lenny Pastel in to see pt and has requested a GI consult.    This dementia pt's  HCPOA is complicated.  Her care giver Talbert Forest [phone] and [phone] states "I am HCPOA and make decisions for this pt who does not have any family members and I am in her will as such but does not have paperwork to support that"  When more questions asked and [facility] [phone]) called the supervisor states the pt paperwork has listed  First HCPOA is Benay Spice [phone] and [phone].  The second HCPOA is Doran Clay her niece [phone].    Clarified with Lenny Pastel that pt at the current time is a full code even though there is an out of facility DNR on chart and ED progress notes states wishes not to code pt.  Tom clarifies "at this time she is a full code".    Pt is being T&S and I will call Tom with results of latest HGB to see if he will order a transfusion.

## 2013-06-30 NOTE — ED Provider Notes (Signed)
Medical screening examination/treatment/procedure(s) were conducted as a shared visit with non-physician practitioner(s) and myself.  I personally evaluated the patient during the encounter.   Please see my separate note.     Candyce Churn, MD 06/30/13 [phone]

## 2013-06-30 NOTE — Progress Notes (Signed)
Patient has not voided since 4am last night.  Patient unable to void; attempted twice, once on bedpan and again on bedside commode, no UO.  Bladder scan showed of urine.  Dr. Mahala Menghini notified; orders for bolus and orders to insert foley catheter received.  Will carry out orders and continue to monitor patient.

## 2013-06-30 NOTE — Consult Note (Signed)
[facility] Consult Note  Referring Provider: No ref. provider found Primary Care Physician:  Ginette Otto, MD Primary Gastroenterologist:  Dr.  Antony Contras Complaint: Rectal bleeding HPI: [name] is an 79 y.o. white female  admitted with bright red blood per rectum and a possible hemodynamic instability with either near-syncope or lites seizure-like activity. This began yesterday. The patient has never had a colonoscopy. She is 79 years old. She denies any pain. She continued to have bloody bowel movements after admission yesterday but has had no bowel movement since 5 AM this morning, currently is 3:15 PM. She had a hemoglobin of 11 which dropped to 8 today with hydration.  Past Medical History  Diagnosis Date  . Arthritis   . Carpal tunnel syndrome on left   . DJD (degenerative joint disease)   . Diverticulosis   . Macular degeneration syndrome   . GERD (gastroesophageal reflux disease)   . Headache(784.0)   . Wears glasses   . Wears hearing aid     both  . Wears partial dentures     top and bottom  . High blood pressure     echo 09-normal    Past Surgical History  Procedure Laterality Date  . Appendectomy    . Hip surgery      left  . Knee surgery    . Tonsillectomy    . Thyroid cyst excision    . Eye surgery      cataractas  . Carpal tunnel release  2/12    rt  . Joint replacement  2006    rt total hip x2  . Carpal tunnel release  04/23/2012    Procedure: CARPAL TUNNEL RELEASE;  Surgeon: Nicki Reaper, MD;  Location: [facility];  Service: Orthopedics;  Laterality: Left;    Medications Prior to Admission  Medication Sig Dispense Refill  . acetaminophen (TYLENOL) 500 MG tablet Take 1,000 mg by mouth at bedtime.       Marland Kitchen amLODipine (NORVASC) 2.5 MG tablet Take 2.5 mg by mouth daily.      . beta carotene w/minerals (OCUVITE) tablet Take 1 tablet by mouth 2 (two) times daily.      . Calcium Carb-Cholecalciferol (CALCIUM 600 + D PO)  Take 1 tablet by mouth 2 (two) times daily.      . cetirizine (ZYRTEC) 10 MG tablet Take 10 mg by mouth every morning.       . moexipril (UNIVASC) 15 MG tablet Take 15 mg by mouth every evening.      Marland Kitchen omeprazole (PRILOSEC) 20 MG capsule Take 20 mg by mouth daily with lunch.       . polyethylene glycol (MIRALAX / GLYCOLAX) packet Take 17 g by mouth every evening.       . vitamin B-12 (CYANOCOBALAMIN) 1000 MCG tablet Take 1,000 mcg by mouth every morning.       . zolpidem (AMBIEN) 10 MG tablet Take 10 mg by mouth at bedtime as needed for sleep.         Allergies:  Allergies  Allergen Reactions  . Celebrex [Celecoxib] Itching  . Morphine And Related Nausea Only  . Vicodin [Hydrocodone-Acetaminophen] Itching  . Sulfa Antibiotics Rash    History reviewed. No pertinent family history.  Social History:  reports that she has never smoked. She does not have any smokeless tobacco history on file. She reports that she drinks alcohol. Her drug history is not on file.  Review of Systems: negative except as above  Blood pressure 131/82, pulse 102, temperature 97.9 F (36.6 C), temperature source Oral, resp. rate 20, height 5\' 2"  (1.575 m), weight 46.6 kg (102 lb 11.8 oz), SpO2 99.00%. Head: Normocephalic, without obvious abnormality, atraumatic Neck: no adenopathy, no carotid bruit, no JVD, supple, symmetrical, trachea midline and thyroid not enlarged, symmetric, no tenderness/mass/nodules Resp: clear to auscultation bilaterally Cardio: regular rate and rhythm, S1, S2 normal, no murmur, click, rub or gallop GI: Abdomen soft with thin abdominal wall musculature no hepatosplenomegaly mass guarding or tenderness Extremities: extremities normal, atraumatic, no cyanosis or edema  Results for orders placed during the hospital encounter of 06/29/13 (from the past 48 hour(s))  OCCULT BLOOD, POC DEVICE     Status: Abnormal   Collection Time    06/29/13  4:37 PM      Result Value Range   Fecal  Occult Bld POSITIVE (*) NEGATIVE  TYPE AND SCREEN     Status: None   Collection Time    06/29/13  5:20 PM      Result Value Range   ABO/RH(D) O POS     Antibody Screen POS     Sample Expiration 07/02/2013     Antibody Identification ANTI-M     DAT, IgG NEG     Unit Number [id number]     Blood Component Type RED CELLS,LR     Unit division 00     Status of Unit ALLOCATED     Transfusion Status OK TO TRANSFUSE     Crossmatch Result COMPATIBLE     Unit Number [id number]     Blood Component Type RED CELLS,LR     Unit division 00     Status of Unit ALLOCATED     Transfusion Status OK TO TRANSFUSE     Crossmatch Result COMPATIBLE    CBC WITH DIFFERENTIAL     Status: Abnormal   Collection Time    06/29/13  5:24 PM      Result Value Range   WBC 12.3 (*) 4.0 - 10.5 K/uL   RBC 3.75 (*) 3.87 - 5.11 MIL/uL   Hemoglobin 11.8 (*) 12.0 - 15.0 g/dL   HCT 96.2 (*) 95.2 - 84.1 %   MCV 94.4  78.0 - 100.0 fL   MCH 31.5  26.0 - 34.0 pg   MCHC 33.3  30.0 - 36.0 g/dL   RDW 32.4  40.1 - 02.7 %   Platelets 255  150 - 400 K/uL   Neutrophils Relative % 79 (*) 43 - 77 %   Neutro Abs 9.7 (*) 1.7 - 7.7 K/uL   Lymphocytes Relative 17  12 - 46 %   Lymphs Abs 2.0  0.7 - 4.0 K/uL   Monocytes Relative 4  3 - 12 %   Monocytes Absolute 0.5  0.1 - 1.0 K/uL   Eosinophils Relative 0  0 - 5 %   Eosinophils Absolute 0.0  0.0 - 0.7 K/uL   Basophils Relative 0  0 - 1 %   Basophils Absolute 0.0  0.0 - 0.1 K/uL  BASIC METABOLIC PANEL     Status: Abnormal   Collection Time    06/29/13  5:24 PM      Result Value Range   Sodium 136  135 - 145 mEq/L   Potassium 4.0  3.5 - 5.1 mEq/L   Chloride 98  96 - 112 mEq/L   CO2 29  19 - 32 mEq/L   Glucose, Bld 129 (*) 70 - 99 mg/dL   BUN 30 (*)  6 - 23 mg/dL   Creatinine, Ser 1.61  0.50 - 1.10 mg/dL   Calcium 09.6 (*) 8.4 - 10.5 mg/dL   GFR calc non Af Amer 43 (*) >90 mL/min   GFR calc Af Amer 50 (*) >90 mL/min   Comment: (NOTE)     The eGFR has been calculated  using the CKD EPI equation.     This calculation has not been validated in all clinical situations.     eGFR's persistently <90 mL/min signify possible Chronic Kidney     Disease.  HEPATIC FUNCTION PANEL     Status: Abnormal   Collection Time    06/29/13  5:24 PM      Result Value Range   Total Protein 6.0  6.0 - 8.3 g/dL   Albumin 3.3 (*) 3.5 - 5.2 g/dL   AST 21  0 - 37 U/L   ALT 10  0 - 35 U/L   Alkaline Phosphatase 70  39 - 117 U/L   Total Bilirubin 0.2 (*) 0.3 - 1.2 mg/dL   Bilirubin, Direct <0.4  0.0 - 0.3 mg/dL   Indirect Bilirubin NOT CALCULATED  0.3 - 0.9 mg/dL  HEMOGLOBIN AND HEMATOCRIT, BLOOD     Status: Abnormal   Collection Time    06/30/13 12:15 AM      Result Value Range   Hemoglobin 10.1 (*) 12.0 - 15.0 g/dL   HCT 54.0 (*) 98.1 - 19.1 %  CBC     Status: Abnormal   Collection Time    06/30/13  4:29 AM      Result Value Range   WBC 7.7  4.0 - 10.5 K/uL   RBC 2.99 (*) 3.87 - 5.11 MIL/uL   Hemoglobin 9.3 (*) 12.0 - 15.0 g/dL   HCT 47.8 (*) 29.5 - 62.1 %   MCV 95.0  78.0 - 100.0 fL   MCH 31.1  26.0 - 34.0 pg   MCHC 32.7  30.0 - 36.0 g/dL   RDW 30.8  65.7 - 84.6 %   Platelets 248  150 - 400 K/uL  BASIC METABOLIC PANEL     Status: Abnormal   Collection Time    06/30/13  4:29 AM      Result Value Range   Sodium 137  135 - 145 mEq/L   Potassium 4.1  3.5 - 5.1 mEq/L   Chloride 104  96 - 112 mEq/L   CO2 27  19 - 32 mEq/L   Glucose, Bld 106 (*) 70 - 99 mg/dL   BUN 33 (*) 6 - 23 mg/dL   Creatinine, Ser 9.62  0.50 - 1.10 mg/dL   Calcium 95.2  8.4 - 84.1 mg/dL   GFR calc non Af Amer 49 (*) >90 mL/min   GFR calc Af Amer 57 (*) >90 mL/min   Comment: (NOTE)     The eGFR has been calculated using the CKD EPI equation.     This calculation has not been validated in all clinical situations.     eGFR's persistently <90 mL/min signify possible Chronic Kidney     Disease.  CBC WITH DIFFERENTIAL     Status: Abnormal   Collection Time    06/30/13  9:48 AM      Result  Value Range   WBC 8.3  4.0 - 10.5 K/uL   RBC 2.61 (*) 3.87 - 5.11 MIL/uL   Hemoglobin 8.2 (*) 12.0 - 15.0 g/dL   HCT 32.4 (*) 40.1 - 02.7 %   MCV 95.0  78.0 - 100.0 fL   MCH 31.4  26.0 - 34.0 pg   MCHC 33.1  30.0 - 36.0 g/dL   RDW 45.4  09.8 - 11.9 %   Platelets 222  150 - 400 K/uL   Neutrophils Relative % 65  43 - 77 %   Neutro Abs 5.4  1.7 - 7.7 K/uL   Lymphocytes Relative 31  12 - 46 %   Lymphs Abs 2.6  0.7 - 4.0 K/uL   Monocytes Relative 3  3 - 12 %   Monocytes Absolute 0.3  0.1 - 1.0 K/uL   Eosinophils Relative 0  0 - 5 %   Eosinophils Absolute 0.0  0.0 - 0.7 K/uL   Basophils Relative 1  0 - 1 %   Basophils Absolute 0.0  0.0 - 0.1 K/uL   No results found.  Assessment: Rectal bleeding, suspect colonic source likely diverticulosis. Plan:  At her age we'll treat conservatively at least initially with no colonoscopy or purge. This may be required but will wait one or 2 days to see if bleeding will cease. If her hemoglobin drops but slower she may need transfusions which she is agreeable with Brysyn Brandenberger C 06/30/2013, 12:48 PM

## 2013-06-30 NOTE — Progress Notes (Addendum)
[name]:[id number] DOB: Jul 12, 1917 DOA: 06/29/2013 PCP: Ginette Otto, MD  Brief narrative: 79 yo ? known h/o Constipation, Htn, Stage 4 dementia, malnutrition admitted 06/29/2013 a.m. with painless rectal bleeding-initially noted to have external hemorrhoid, had about 1 L blood loss overnight 06/29/13-06/30/13-hematocrit dropped from 11 to 9.3. Overnight but she has had no further bleeding this a.m. 11/3  Past medical history-As per Problem list Chart reviewed as below- Yes  Consultants:  Gastroenterology consulted-[facility]  Procedures:  None  Antibiotics:  None   Subjective  Alert, cannot tell me exactly where she is, some confusion otherwise Tolerating clears-no chest pain no shortness of breath Caregiver/tertiary healthcare power of attorney is at bedside    Objective    Interim History: Reviewed  Telemetry: Sinus tachycardia, rates ranging between 101 10  Objective: Filed Vitals:   06/29/13 2015 06/30/13 0018 06/30/13 0300 06/30/13 0459  BP: 122/77 116/70 125/80 131/82  Pulse: 114 110 111 102  Temp: 98.1 F (36.7 C)   97.9 F (36.6 C)  TempSrc: Oral   Oral  Resp: 18 18 18 20   Height: 5\' 2"  (1.575 m)     Weight: 46.6 kg (102 lb 11.8 oz)     SpO2: 99% 100% 99% 99%    Intake/Output Summary (Last 24 hours) at 06/30/13 0951 Last data filed at 06/29/13 2300  Gross per 24 hour  Intake    150 ml  Output    150 ml  Net      0 ml    Exam:  General: EOMI, NCAT, no pallor no icterus Cardiovascular: S1-S2 tachycardic, regular Respiratory: Clinically clear Abdomen: Soft nontender no rebound no guarding no distention Skin lower extremity areas of bruises Neuro grossly intact physically, however she also has some other issues in terms of mental status not being completely clear and mild confusion  Data Reviewed: Basic Metabolic Panel:  Recent Labs Lab 06/29/13 1724 06/30/13 0429  NA 136 137  K 4.0 4.1  CL 98 104  CO2 29 27  GLUCOSE  129* 106*  BUN 30* 33*  CREATININE 1.07 0.96  CALCIUM 11.2* 10.1   Liver Function Tests:  Recent Labs Lab 06/29/13 1724  AST 21  ALT 10  ALKPHOS 70  BILITOT 0.2*  PROT 6.0  ALBUMIN 3.3*   No results found for this basename: LIPASE, AMYLASE,  in the last 168 hours No results found for this basename: AMMONIA,  in the last 168 hours CBC:  Recent Labs Lab 06/29/13 1724 06/30/13 0015 06/30/13 0429  WBC 12.3*  --  7.7  NEUTROABS 9.7*  --   --   HGB 11.8* 10.1* 9.3*  HCT 35.4* 29.7* 28.4*  MCV 94.4  --  95.0  PLT 255  --  248   Cardiac Enzymes: No results found for this basename: CKTOTAL, CKMB, CKMBINDEX, TROPONINI,  in the last 168 hours BNP: No components found with this basename: POCBNP,  CBG: No results found for this basename: GLUCAP,  in the last 168 hours  No results found for this or any previous visit (from the past 240 hour(s)).   Studies:              All Imaging reviewed and is as per above notation   Scheduled Meds: . beta carotene w/minerals  1 tablet Oral BID  . loratadine  10 mg Oral Daily  . pantoprazole  40 mg Oral Daily  . trandolapril  1 mg Oral Daily  . vitamin B-12  1,000 mcg Oral  q morning - 10a   Continuous Infusions: . sodium chloride       Assessment/Plan: 1. Undifferentiated GI bleed-suspect either internal hemorrhoids vs. diverticular disease-unclear picture as elevated BUn/Creat ratio.  gastroenterology to comment. Continue cycling hemoglobin every 6 hourly. If hemoglobin drops below 7, will transfuse. GI consult is pending 2. Anemia  of acute blood loss-see above. 3. Stage 3-4 dementia-continue monitoring. Will need to re-orient.   Family Communication:  primary health care power of attorney is none at bedside-await #  from tertiary healthcare power of attorney in order to update  Disposition Plan:  Inpatient-suspect might need 24/7 care  DO NOT RESUSCITATE    Pleas Koch, MD  [facility] Pager [phone] 06/30/2013,  9:51 AM    LOS: 1 day

## 2013-07-01 DIAGNOSIS — R000 Tachycardia, unspecified: Secondary | ICD-10-CM

## 2013-07-01 DIAGNOSIS — D62 Acute posthemorrhagic anemia: Secondary | ICD-10-CM

## 2013-07-01 LAB — HEMOGLOBIN AND HEMATOCRIT, BLOOD: Hemoglobin: 7.3 g/dL — ABNORMAL LOW (ref 12.0–15.0)

## 2013-07-01 MED ORDER — ZOLPIDEM TARTRATE 5 MG PO TABS
5.0000 mg | ORAL_TABLET | Freq: Every day | ORAL | Status: DC
  Administered 2013-07-01 – 2013-07-03 (×3): 5 mg via ORAL
  Filled 2013-07-01 (×3): qty 1

## 2013-07-01 MED ORDER — VITAMINS A & D EX OINT
TOPICAL_OINTMENT | CUTANEOUS | Status: AC
  Administered 2013-07-01: 03:00:00
  Filled 2013-07-01: qty 5

## 2013-07-01 NOTE — Progress Notes (Addendum)
[name] XBJ:[id number] DOB: 12/21/1916 DOA: 06/29/2013 PCP: Ginette Otto, MD  Brief narrative: 79 yo ? known h/o Constipation, Htn, Stage 4 dementia, malnutrition admitted 06/29/2013 a.m. with painless rectal bleeding-initially noted to have external hemorrhoid, had about 1 L blood loss overnight 06/29/13-06/30/13-hematocrit dropped from 11 to 9.3. Overnight but she has had no further bleeding this a.m. 11/3  Past medical history-As per Problem list Chart reviewed as below- Yes  Consultants:  Gastroenterology consulted-[facility]  Procedures:  None  Antibiotics:  None   Subjective   Had one bloody BM this morning.  Denies lightheadedness, SOB, abdominal pain.      Objective   Telemetry: Atrial arrhythmia, no atrial fibrillation, tachycardic to the 100-110s  Objective: Filed Vitals:   06/30/13 0300 06/30/13 0459 06/30/13 1434 07/01/13 0530  BP: 125/80 131/82 119/68 145/86  Pulse: 111 102 107 114  Temp:  97.9 F (36.6 C) 97.8 F (36.6 C) 98.6 F (37 C)  TempSrc:  Oral Oral Oral  Resp: 18 20 20 20   Height:      Weight:      SpO2: 99% 99% 99% 99%    Intake/Output Summary (Last 24 hours) at 07/01/13 1030 Last data filed at 07/01/13 0500  Gross per 24 hour  Intake 189.17 ml  Output   2025 ml  Net -1835.83 ml    Exam:  General: EOMI, NCAT, mild pallor no icterus Cardiovascular: tachycardic, regular rhythm, no mrg, S1-S2  Respiratory: CTAB Abdomen: Soft nontender no rebound no guarding no distention Skin lower extremity areas of bruises Neuro: grossly intact physically Psych:  Mild confusion.  Hearing impaired.   Data Reviewed: Basic Metabolic Panel:  Recent Labs Lab 06/29/13 1724 06/30/13 0429  NA 136 137  K 4.0 4.1  CL 98 104  CO2 29 27  GLUCOSE 129* 106*  BUN 30* 33*  CREATININE 1.07 0.96  CALCIUM 11.2* 10.1   Liver Function Tests:  Recent Labs Lab 06/29/13 1724  AST 21  ALT 10  ALKPHOS 70  BILITOT 0.2*  PROT 6.0    ALBUMIN 3.3*   No results found for this basename: LIPASE, AMYLASE,  in the last 168 hours No results found for this basename: AMMONIA,  in the last 168 hours CBC:  Recent Labs Lab 06/29/13 1724  06/30/13 0429 06/30/13 0948 06/30/13 1713 06/30/13 2330 07/01/13 0320  WBC 12.3*  --  7.7 8.3  --   --   --   NEUTROABS 9.7*  --   --  5.4  --   --   --   HGB 11.8*  < > 9.3* 8.2* 8.0* 7.1* 7.3*  HCT 35.4*  < > 28.4* 24.8* 23.5* 20.7* 22.4*  MCV 94.4  --  95.0 95.0  --   --   --   PLT 255  --  248 222  --   --   --   < > = values in this interval not displayed. Cardiac Enzymes: No results found for this basename: CKTOTAL, CKMB, CKMBINDEX, TROPONINI,  in the last 168 hours BNP: No components found with this basename: POCBNP,  CBG: No results found for this basename: GLUCAP,  in the last 168 hours  No results found for this or any previous visit (from the past 240 hour(s)).   Studies:              All Imaging reviewed and is as per above notation   Scheduled Meds: . beta carotene w/minerals  1 tablet Oral BID  .  loratadine  10 mg Oral Daily  . pantoprazole  40 mg Oral Daily  . vitamin B-12  1,000 mcg Oral q morning - 10a   Continuous Infusions:     Assessment/Plan: Undifferentiated GI bleed-suspect either internal hemorrhoids vs. diverticular disease -  unclear picture as elevated BUn/Creat ratio -  Transfuse 2 units PRBC today and repeat CBC in AM -  Appreciate GI assistance  Anemia  of acute blood loss-hgb trended down to 7.1mg /dl overnight and had bloody BM this morning -  Transfuse for hgb < 7 or hemodynamic instability with ongoing bleeding  Stage 3-4 dementia-continue monitoring. Will need to re-orient.  -  Appreciate caregiver support  Urinary retention overnight, foley placed -  Continue foley today and plan to d/c tomorrow with voiding trial -  Minimize narcotics or other medications which can contribute to urinary retention  HTN, blood pressure mildly  elevated x 1 -  Continue to monitor.  If becomes persistently elevated, consider restarted norvasc  Sinus tachycardia, may be related to GIB and anemia.   -  Transfuse PRBC today  DIET:  Full liquid ACCESS:  PIV IVF:  off PROPH:  SCD  Code:   DO NOT RESUSCITATE  Family Communication:  primary health care power of attorney is none at bedside-await #  from tertiary healthcare power of attorney in order to update  Disposition Plan:  Inpatient-suspect might need 24/7 care.  PT/OT and OOB    Renae Fickle, MD  [facility] Pager [phone]  07/01/2013, 10:30 AM    LOS: 2 days

## 2013-07-01 NOTE — Progress Notes (Signed)
OT Cancellation Note  Patient Details Name: [name] MRN: [id number] DOB: Nov 12, 1916   Cancelled Treatment:    Reason Eval/Treat Not Completed: Other (comment)  Pt getting blood. Will check back tomorrow.    Creg Gilmer 07/01/2013, 2:08 PM Marica Otter, OTR/L [phone] 07/01/2013

## 2013-07-01 NOTE — Progress Notes (Signed)
[facility] Progress Note  Subjective: Patient apparently had some sundowning last night, unclear whether she had any bowel movement.  Objective: Vital signs in last 24 hours: Temp:  [97.8 F (36.6 C)-98.6 F (37 C)] 98.6 F (37 C) (11/04 0530) Pulse Rate:  [107-114] 114 (11/04 0530) Resp:  [20] 20 (11/04 0530) BP: (119-145)/(68-86) 145/86 mmHg (11/04 0530) SpO2:  [99 %] 99 % (11/04 0530) Weight change:    PE: Unchanged fairly alert and oriented  Lab Results: Results for orders placed during the hospital encounter of 06/29/13 (from the past 24 hour(s))  CBC WITH DIFFERENTIAL     Status: Abnormal   Collection Time    06/30/13  9:48 AM      Result Value Range   WBC 8.3  4.0 - 10.5 K/uL   RBC 2.61 (*) 3.87 - 5.11 MIL/uL   Hemoglobin 8.2 (*) 12.0 - 15.0 g/dL   HCT 16.1 (*) 09.6 - 04.5 %   MCV 95.0  78.0 - 100.0 fL   MCH 31.4  26.0 - 34.0 pg   MCHC 33.1  30.0 - 36.0 g/dL   RDW 40.9  81.1 - 91.4 %   Platelets 222  150 - 400 K/uL   Neutrophils Relative % 65  43 - 77 %   Neutro Abs 5.4  1.7 - 7.7 K/uL   Lymphocytes Relative 31  12 - 46 %   Lymphs Abs 2.6  0.7 - 4.0 K/uL   Monocytes Relative 3  3 - 12 %   Monocytes Absolute 0.3  0.1 - 1.0 K/uL   Eosinophils Relative 0  0 - 5 %   Eosinophils Absolute 0.0  0.0 - 0.7 K/uL   Basophils Relative 1  0 - 1 %   Basophils Absolute 0.0  0.0 - 0.1 K/uL  HEMOGLOBIN AND HEMATOCRIT, BLOOD     Status: Abnormal   Collection Time    06/30/13  5:13 PM      Result Value Range   Hemoglobin 8.0 (*) 12.0 - 15.0 g/dL   HCT 78.2 (*) 95.6 - 21.3 %  HEMOGLOBIN AND HEMATOCRIT, BLOOD     Status: Abnormal   Collection Time    06/30/13 11:30 PM      Result Value Range   Hemoglobin 7.1 (*) 12.0 - 15.0 g/dL   HCT 08.6 (*) 57.8 - 46.9 %  HEMOGLOBIN AND HEMATOCRIT, BLOOD     Status: Abnormal   Collection Time    07/01/13  3:20 AM      Result Value Range   Hemoglobin 7.3 (*) 12.0 - 15.0 g/dL   HCT 62.9 (*) 52.8 - 41.3 %     Studies/Results: No results found.    Assessment: Lower GI bleeding probably diverticular.  Plan: Continue to pursue non-endoscopic approach if possible. We'll go ahead and transfuse 2 units of packed red blood cells slowly. Will allow full liquid diet today    Carlesha Seiple C 07/01/2013, 7:17 AM

## 2013-07-01 NOTE — Progress Notes (Signed)
Patient had of bright red blood per rectum.  First unit of blood transfusing now, second to follow.  If hemodynamic instability, recurrent large bloody BM, or nursing staff concerns, will transfer to stepdown unit.

## 2013-07-02 DIAGNOSIS — I498 Other specified cardiac arrhythmias: Secondary | ICD-10-CM

## 2013-07-02 LAB — BASIC METABOLIC PANEL
BUN: 14 mg/dL (ref 6–23)
CO2: 26 mEq/L (ref 19–32)
Chloride: 105 mEq/L (ref 96–112)
Creatinine, Ser: 0.81 mg/dL (ref 0.50–1.10)
GFR calc non Af Amer: 61 mL/min — ABNORMAL LOW (ref >90)
Glucose, Bld: 90 mg/dL (ref 70–99)
Sodium: 137 mEq/L (ref 135–145)

## 2013-07-02 LAB — CBC
HCT: 30.1 % — ABNORMAL LOW (ref 36.0–46.0)
Hemoglobin: 10.2 g/dL — ABNORMAL LOW (ref 12.0–15.0)
MCV: 88.5 fL (ref 78.0–100.0)
RDW: 16.4 % — ABNORMAL HIGH (ref 11.5–15.5)
WBC: 7.5 10*3/uL (ref 4.0–10.5)

## 2013-07-02 LAB — TYPE AND SCREEN
ABO/RH(D): O POS
Antibody Screen: POSITIVE
DAT, IgG: NEGATIVE
Unit division: 0

## 2013-07-02 MED ORDER — ENSURE COMPLETE PO LIQD
237.0000 mL | Freq: Three times a day (TID) | ORAL | Status: DC
  Administered 2013-07-02: 237 mL via ORAL

## 2013-07-02 MED ORDER — AMLODIPINE BESYLATE 2.5 MG PO TABS
2.5000 mg | ORAL_TABLET | Freq: Every day | ORAL | Status: DC
  Administered 2013-07-02 – 2013-07-04 (×3): 2.5 mg via ORAL
  Filled 2013-07-02 (×3): qty 1

## 2013-07-02 MED ORDER — ENSURE COMPLETE PO LIQD
237.0000 mL | Freq: Three times a day (TID) | ORAL | Status: DC
  Administered 2013-07-02 – 2013-07-04 (×5): 237 mL via ORAL

## 2013-07-02 NOTE — Evaluation (Signed)
Occupational Therapy Evaluation Patient Details Name: [name] MRN: [id number] DOB: 06-Jun-1917 Today's Date: 07/02/2013 Time: 4098-1191 OT Time Calculation (min): 35 min  OT Assessment / Plan / Recommendation History of present illness [name] is an 79 y.o. female with past medical history of constipation for which she takes miralax daily presents with bright red blood per rectum which started this afternoon.  She has dementia, the history is given by her care givers.  They state that [name] has for several months had very low PO intake, and has had the sensation of incomplete emptying of the bowels. Despite taking miralax daily she strains to have bowel movements.  Pt had several episodes of frank blood in the toilet and then continued to have bleeding from the rectum even after moving her bowels. She has not complained of pain in the abdomen or rectal area.   Clinical Impression   Pt presents w/ dx as above and is currently Min A for ADL's and safety due to h/o dementia, vision loss, see PMH. Pt has 3 caregivers 7 days per week and currently lives at [facility] in independent living with their assist. Pt caregiver of 10 years reports that she is currently at baseline for ADL's & self care needs & also states that plan will be for pt to return home when medically able w/ same level of assistance. No further needs identified at this time, will sign off acute OT.    OT Assessment  Patient does not need any further OT services    Follow Up Recommendations  Supervision/Assistance - 24 hour    Barriers to Discharge      Equipment Recommendations  None recommended by OT    Recommendations for Other Services    Frequency       Precautions / Restrictions Precautions Precautions: Fall;Other (comment) (DNR) Restrictions Weight Bearing Restrictions: No   Pertinent Vitals/Pain Denies pain    ADL  Eating/Feeding: Simulated;Set up Where Assessed - Eating/Feeding:  Chair Grooming: Performed;Wash/dry hands;Set up (Sitting EOB) Where Assessed - Grooming: Unsupported sitting Upper Body Bathing: Simulated;Minimal assistance Where Assessed - Upper Body Bathing: Supported sitting Lower Body Bathing: Simulated;Minimal assistance Where Assessed - Lower Body Bathing: Supported sitting;Supported sit to stand Upper Body Dressing: Performed;Set up;Supervision/safety Where Assessed - Upper Body Dressing: Supported sitting Lower Body Dressing: Simulated;Minimal assistance Where Assessed - Lower Body Dressing: Supported sit to stand Toilet Transfer: Simulated;Minimal assistance (Transfer from EOB to chair w/ RW) Toilet Transfer Method: Sit to Barista: Materials engineer and Hygiene: Simulated;Minimal assistance Where Assessed - Engineer, mining and Hygiene: Sit to stand from 3-in-1 or toilet;Standing Tub/Shower Transfer Method: Not assessed Equipment Used: Gait belt;Rolling walker Transfers/Ambulation Related to ADLs: Pt was min-min guard assist sit to stand from EOB and use of RW while transferring to recliner. Pt is HOH and has decreased vision/legally blind in left eye & h/o dementia, she benefits from vc's/tc's for safety, sequencing and hand placement. ADL Comments: Pt and caregiver were educated in role of OT. Pt participated in functional transfers and ADL sitting EOB for grooming. Decreased vision, dementia & HOH impact pt ability to independently perform ADL's independently. Pt has caregivers that assist 7 days/week for all aspects of ADL's & caregiver whom has worked w/ pt for last 10 years, reports that pt is currently @ baseline for level of assist needed for ADL's.    OT Diagnosis:    OT Problem List:   OT Treatment Interventions:  OT Goals(Current goals can be found in the care plan section) Acute Rehab OT Goals Patient Stated Goal: Go home to [facility]  Visit Information   Last OT Received On: 07/02/13 Assistance Needed: +1 History of Present Illness: [name] is an 79 y.o. female with past medical history of constipation for which she takes miralax daily presents with bright red blood per rectum which started this afternoon.  She has dementia, the history is given by her care givers.  They state that [name] has for several months had very low PO intake, and has had the sensation of incomplete emptying of the bowels. Despite taking miralax daily she strains to have bowel movements.  Pt had several episodes of frank blood in the toilet and then continued to have bleeding from the rectum even after moving her bowels. She has not complained of pain in the abdomen or rectal area.       Prior Functioning     Home Living Family/patient expects to be discharged to:: Other (Comment) (Independent living [facility] w/ Caregivers ) Additional Comments: 3 Caregivers Over 7 days per week Prior Function Level of Independence: Needs assistance Gait / Transfers Assistance Needed: Amb w/ RW & SBA per caregiver reports ADL's / Homemaking Assistance Needed: Assist for bathing, dressing and homemaking 7 days per week w/ 3 caregivers assisting PRN.  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, Left w/ some vision) Patient Visual Report: No change from baseline   Cognition  Cognition Arousal/Alertness: Awake/alert Behavior During Therapy: WFL for tasks assessed/performed Overall Cognitive Status: History of cognitive impairments - at baseline    Extremity/Trunk Assessment Upper Extremity Assessment Upper Extremity Assessment: Generalized weakness;Overall [facility] for tasks assessed Lower Extremity Assessment Lower Extremity Assessment: Defer to PT  evaluation     Mobility Bed Mobility Bed Mobility: Supine to Sit;Sitting - Scoot to Edge of Bed Supine to Sit: 5: Supervision;HOB elevated Sitting - Scoot to Edge of Bed: 4: Min guard;With rail Transfers Transfers: Sit to Stand;Stand to Sit Sit to Stand: 4: Min guard;From bed;With upper extremity assist Stand to Sit: 4: Min guard;To chair/3-in-1;With armrests;With upper extremity assist Details for Transfer Assistance: VC/TC's for safety, sequencing and hand placement for controlled decent to chair. Caregiver reports that this is baseline level.        Balance Balance Balance Assessed: Yes Static Sitting Balance Static Sitting - Balance Support: Bilateral upper extremity supported;Feet supported Static Standing Balance Static Standing - Balance Support: Bilateral upper extremity supported   End of Session OT - End of Session Equipment Utilized During Treatment: Gait belt;Rolling walker Activity Tolerance: Patient tolerated treatment well Patient left: in chair;with call bell/phone within reach;with chair alarm set;with family/visitor present Nurse Communication: Mobility status  GO     Roselie Awkward Dixon 07/02/2013, 10:23 AM

## 2013-07-02 NOTE — Progress Notes (Signed)
Pt had one small bloody stool. Will continue to monitor.

## 2013-07-02 NOTE — Evaluation (Signed)
I have reviewed this note and agree with all findings. Kati Alliyah Roesler, PT, DPT Pager: [phone]   

## 2013-07-02 NOTE — Progress Notes (Signed)
[facility] Progress Note  Subjective: Patient states she feels better today. Did have 1 large bloody bowel movement yesterday and 2 more bowel movements during the night without definite description.  Objective: Vital signs in last 24 hours: Temp:  [98 F (36.7 C)-99.3 F (37.4 C)] 98.6 F (37 C) (11/05 0543) Pulse Rate:  [90-105] 90 (11/05 0543) Resp:  [14-18] 16 (11/05 0543) BP: (130-180)/(63-95) 154/79 mmHg (11/05 0543) SpO2:  [97 %-100 %] 98 % (11/05 0543) Weight change:    PE: Alert oriented in no acute distress  Lab Results: Results for orders placed during the hospital encounter of 06/29/13 (from the past 24 hour(s))  PREPARE RBC (CROSSMATCH)     Status: None   Collection Time    07/01/13  9:00 AM      Result Value Range   Order Confirmation ORDER PROCESSED BY BLOOD BANK    CBC     Status: Abnormal   Collection Time    07/02/13  4:30 AM      Result Value Range   WBC 7.5  4.0 - 10.5 K/uL   RBC 3.40 (*) 3.87 - 5.11 MIL/uL   Hemoglobin 10.2 (*) 12.0 - 15.0 g/dL   HCT 16.1 (*) 09.6 - 04.5 %   MCV 88.5  78.0 - 100.0 fL   MCH 30.0  26.0 - 34.0 pg   MCHC 33.9  30.0 - 36.0 g/dL   RDW 40.9 (*) 81.1 - 91.4 %   Platelets 174  150 - 400 K/uL  BASIC METABOLIC PANEL     Status: Abnormal   Collection Time    07/02/13  4:30 AM      Result Value Range   Sodium 137  135 - 145 mEq/L   Potassium 3.8  3.5 - 5.1 mEq/L   Chloride 105  96 - 112 mEq/L   CO2 26  19 - 32 mEq/L   Glucose, Bld 90  70 - 99 mg/dL   BUN 14  6 - 23 mg/dL   Creatinine, Ser 7.82  0.50 - 1.10 mg/dL   Calcium 9.0  8.4 - 95.6 mg/dL   GFR calc non Af Amer 61 (*) >90 mL/min   GFR calc Af Amer 70 (*) >90 mL/min    Studies/Results: No results found.    Assessment: Lower GI bleed, probably diverticular, with continued recent bright red blood per rectum but appropriate response in hemoglobin to transfusion.  Plan: Continue to monitor stools and hemoglobin and avoid colonoscopy if possible.  Will allow regular diet today.    Abram Sax C 07/02/2013, 7:28 AM

## 2013-07-02 NOTE — Evaluation (Signed)
Physical Therapy Evaluation Patient Details Name: [name] MRN: [id number] DOB: Mar 12, 1917 Today's Date: 07/02/2013 Time: 4098-1191 PT Time Calculation (min): 13 min  PT Assessment / Plan / Recommendation History of Present Illness  JI Bennett is an 79 y.o. female with past medical history of constipation for which she takes miralax daily presents with bright red blood per rectum which started this afternoon.  She has dementia, the history is given by her care givers.  They state that [name] has for several months had very low PO intake, and has had the sensation of incomplete emptying of the bowels. Despite taking miralax daily she strains to have bowel movements.  Pt had several episodes of frank blood in the toilet and then continued to have bleeding from the rectum even after moving her bowels. She has not complained of pain in the abdomen or rectal area.  Clinical Impression  Pt admitted with above. Pt currently presenting with functional limitations due to deficits listed below (see PT Problem List). Pt would benefit from skilled PT in order to increase independence and safety during mobility and to allow d/c to venue below. Pt able to perform sit<>stand transfer and ambulate with RW all with min guard to ensure safety. PT recommends HHPT to increase LE strength, endurance and safety.  Pt lives in independent living with sitters.    PT Assessment  Patient needs continued PT services    Follow Up Recommendations  Home health PT;Supervision/Assistance - 24 hour    Does the patient have the potential to tolerate intense rehabilitation      Barriers to Discharge        Equipment Recommendations  None recommended by PT    Recommendations for Other Services     Frequency Min 3X/week    Precautions / Restrictions Precautions Precautions: Fall Restrictions Weight Bearing Restrictions: No   Pertinent Vitals/Pain No c/o pain/SOB during session.      Mobility  Bed  Mobility Bed Mobility: Not assessed Supine to Sit: 5: Supervision;HOB elevated Sitting - Scoot to Edge of Bed: 4: Min guard;With rail Details for Bed Mobility Assistance: sitting EOB upon arrival. Transfers Transfers: Sit to Stand;Stand to Sit Sit to Stand: 4: Min guard;With upper extremity assist;With armrests;From chair/3-in-1 Stand to Sit: 4: Min guard;To chair/3-in-1;With armrests;With upper extremity assist Details for Transfer Assistance: min guard to ensure safety. VC's for hand placement. Ambulation/Gait Ambulation/Gait Assistance: 4: Min guard Ambulation Distance (Feet): 20 Feet Assistive device: Rolling walker Ambulation/Gait Assistance Details: min guard to ensure safety and VC's to negotiate objects. Gait Pattern: Decreased dorsiflexion - left;Decreased dorsiflexion - right;Decreased stride length;Step-through pattern;Trunk flexed Gait velocity: decreased    Exercises     PT Diagnosis: Difficulty walking;Generalized weakness  PT Problem List: Decreased strength;Decreased activity tolerance;Decreased mobility;Decreased cognition;Decreased knowledge of use of DME PT Treatment Interventions: DME instruction;Gait training;Functional mobility training;Therapeutic activities;Therapeutic exercise;Patient/family education     PT Goals(Current goals can be found in the care plan section) Acute Rehab PT Goals Patient Stated Goal: none specified PT Goal Formulation: With patient Time For Goal Achievement: 07/16/13 Potential to Achieve Goals: Good  Visit Information  Last PT Received On: 07/02/13 Assistance Needed: +1 History of Present Illness: [name] is an 79 y.o. female with past medical history of constipation for which she takes miralax daily presents with bright red blood per rectum which started this afternoon.  She has dementia, the history is given by her care givers.  They state that [name] has for several  months had very low PO intake, and has had the  sensation of incomplete emptying of the bowels. Despite taking miralax daily she strains to have bowel movements.  Pt had several episodes of frank blood in the toilet and then continued to have bleeding from the rectum even after moving her bowels. She has not complained of pain in the abdomen or rectal area.       Prior Functioning  Home Living Family/patient expects to be discharged to:: Private residence (independent living at [facility] with sitters) Living Arrangements: Other (Comment) (she has sitters) Available Help at Discharge: Available 24 hours/day;Other (Comment) (pt has sitters available, but reported no sitter from 12:30p-5:30p) Type of Home: Apartment Home Access: Level entry Home Layout: One level Home Equipment: Walker - 4 wheels;Walker - 2 wheels Additional Comments: pt reports she has a caregivers from 7:30a-12:30p and 5:30p-7:30a. Prior Function Level of Independence: Needs assistance Gait / Transfers Assistance Needed: Amb w/ RW & SBA per caregiver reports ADL's / Homemaking Assistance Needed: Assist for bathing, dressing and homemaking 7 days per week w/ 3 caregivers assisting PRN.  Communication / Swallowing Assistance Needed: HOH Comments: pt reports she requires occassional assist from sitters for ADLs and is able to ambulate with rW. Communication Communication: HOH Dominant Hand: Right    Cognition  Cognition Arousal/Alertness: Awake/alert Behavior During Therapy: WFL for tasks assessed/performed Overall Cognitive Status: History of cognitive impairments - at baseline    Extremity/Trunk Assessment Upper Extremity Assessment Upper Extremity Assessment: Generalized weakness;Overall [facility] for tasks assessed Lower Extremity Assessment Lower Extremity Assessment: Generalized weakness   Balance Balance Balance Assessed: Yes Static Sitting Balance Static Sitting - Balance Support: Bilateral upper extremity supported;Feet supported Static Standing  Balance Static Standing - Balance Support: Bilateral upper extremity supported  End of Session PT - End of Session Equipment Utilized During Treatment: Gait belt Activity Tolerance: Patient tolerated treatment well Patient left: in chair;with call bell/phone within reach;with chair alarm set  GP     Sol Blazing 07/02/2013, 1:03 PM

## 2013-07-02 NOTE — Progress Notes (Signed)
[facility] PROGRESS NOTE  [name] ZOX:[id number] DOB: [birth year] DOA: 06/29/2013 PCP: Ginette Otto, MD  Assessment/Plan: #1 GI bleed Likely lower GI bleed likely secondary to diverticular bleed. Patient still with bloody bowel movements yesterday. No bloody bowel movements today. Patient is status post 2 units of packed red blood cells. Appropriate response with hemoglobin. Patient's hemoglobin is 10.2 today. Continue Protonix. Continue supportive care. GI following and appreciate input and recommendations. Diet has been advanced to regular diet.  #2 acute blood loss anemia Secondary to problem #1. Patient is status post 2 units packed red blood cells. Hemoglobin currently at 10.2. Transfusion threshold hemoglobin less than 7. Follow H&H.  #3 urinary retention DC Foley catheter today voiding trial.  #4 hypertension Resume home regimen of Norvasc.  #5 sinus tachycardia Likely secondary to problem #1 and 2. Improved. Norvasc has been resumed.  #6 prophylaxis PPI for GI prophylaxis. SCDs for DVT prophylaxis.  Code Status: DO NOT RESUSCITATE. Family Communication: Updated patient no family present. Disposition Plan: Back to [facility] when medically stable.   Consultants:  Gastroenterology: Dr. Dorena Cookey 06/30/2013  Procedures:  2 units packed red blood cells 07/01/13  Antibiotics:  None  HPI/Subjective: Patient sitting up in chair no complaints. No shortness of breath. No chest pain. Per nursing patient did have 400 cc bright red blood per rectum yesterday and 2 more bloody bowel movements overnight that were formed with some blood in it. No bloody bowel movements today.  Objective: Filed Vitals:   07/02/13 1416  BP: 143/61  Pulse: 88  Temp: 98.1 F (36.7 C)  Resp: 18    Intake/Output Summary (Last 24 hours) at 07/02/13 1428 Last data filed at 07/02/13 0600  Gross per 24 hour  Intake 3583.75 ml  Output    900 ml  Net 2683.75 ml   Filed  Weights   06/29/13 2015  Weight: 46.6 kg (102 lb 11.8 oz)    Exam:   General:  NAD  Cardiovascular: RRR  Respiratory: CTAB anterior lung fields  Abdomen: Soft, nontender, nondistended, positive bowel sounds.  Musculoskeletal: No clubbing cyanosis or edema.  Data Reviewed: Basic Metabolic Panel:  Recent Labs Lab 06/29/13 1724 06/30/13 0429 07/02/13 0430  NA 136 137 137  K 4.0 4.1 3.8  CL 98 104 105  CO2 29 27 26   GLUCOSE 129* 106* 90  BUN 30* 33* 14  CREATININE 1.07 0.96 0.81  CALCIUM 11.2* 10.1 9.0   Liver Function Tests:  Recent Labs Lab 06/29/13 1724  AST 21  ALT 10  ALKPHOS 70  BILITOT 0.2*  PROT 6.0  ALBUMIN 3.3*   No results found for this basename: LIPASE, AMYLASE,  in the last 168 hours No results found for this basename: AMMONIA,  in the last 168 hours CBC:  Recent Labs Lab 06/29/13 1724  06/30/13 0429 06/30/13 0948 06/30/13 1713 06/30/13 2330 07/01/13 0320 07/02/13 0430  WBC 12.3*  --  7.7 8.3  --   --   --  7.5  NEUTROABS 9.7*  --   --  5.4  --   --   --   --   HGB 11.8*  < > 9.3* 8.2* 8.0* 7.1* 7.3* 10.2*  HCT 35.4*  < > 28.4* 24.8* 23.5* 20.7* 22.4* 30.1*  MCV 94.4  --  95.0 95.0  --   --   --  88.5  PLT 255  --  248 222  --   --   --  174  < > =  values in this interval not displayed. Cardiac Enzymes: No results found for this basename: CKTOTAL, CKMB, CKMBINDEX, TROPONINI,  in the last 168 hours BNP (last 3 results) No results found for this basename: PROBNP,  in the last 8760 hours CBG: No results found for this basename: GLUCAP,  in the last 168 hours  No results found for this or any previous visit (from the past 240 hour(s)).   Studies: No results found.  Scheduled Meds: . amLODipine  2.5 mg Oral Daily  . beta carotene w/minerals  1 tablet Oral BID  . feeding supplement (ENSURE COMPLETE)  237 mL Oral TID BM  . loratadine  10 mg Oral Daily  . pantoprazole  40 mg Oral Daily  . vitamin B-12  1,000 mcg Oral q morning -  10a  . zolpidem  5 mg Oral QHS   Continuous Infusions:   Principal Problem:   GI bleed Active Problems:   Hypertension   Dementia   GERD (gastroesophageal reflux disease)   B12 deficiency   Syncope   Arrhythmia   Macular degeneration   Protein calorie malnutrition   Sinus tachycardia   Acute blood loss anemia    Time spent: 30 MINS    [facility]  [facility] Pager [phone]. If 7PM-7AM, please contact night-coverage at [email or web address], password [facility] 07/02/2013, 2:28 PM  LOS: 3 days

## 2013-07-02 NOTE — Progress Notes (Addendum)
INITIAL NUTRITION ASSESSMENT  DOCUMENTATION CODES Per approved criteria  -Not Applicable   INTERVENTION: Provide Ensure Complete TID Encourage PO intake, 3 meals daily Change diet order to include "with assist"  NUTRITION DIAGNOSIS: Predicted suboptimal energy intake related to dementia and poor appetite as evidenced by pt's chronic use of Ensure TID to maintain weight.  Goal: Pt to meet >/= 90% of their estimated nutrition needs   Monitor:  PO intake Weight Labs  Reason for Assessment: Per RN request  79 y.o. female  Admitting Dx: GI bleed  ASSESSMENT: 79 y.o. female with past medical history of constipation for which she takes miralax daily presents with bright red blood per rectum. Pt has history of dementia, hypertension, and malnutrition.  Per RN pt is requesting Ensure Complete TID, stating she was drinking it PTA. RD met with pt who reports usual body weight of 108 lbs. States she usually eats 3 meals daily and drinks Ensure at 10 AM and with lunch and dinner. Per chart, pt has had low PO intake for several months. Pt at nutrition risk due to age, dementia, and low BMI of 18.65.  Height: Ht Readings from Last 1 Encounters:  06/29/13 5\' 2"  (1.575 m)    Weight: Wt Readings from Last 1 Encounters:  06/29/13 102 lb 11.8 oz (46.6 kg)    Ideal Body Weight: 110 lbs  % Ideal Body Weight: 93%  Wt Readings from Last 10 Encounters:  06/29/13 102 lb 11.8 oz (46.6 kg)  04/23/12 103 lb (46.72 kg)  04/23/12 103 lb (46.72 kg)    Usual Body Weight: 108 lbs per pt  % Usual Body Weight: 94%  BMI:  Body mass index is 18.65 kg/(m^2).  Estimated Nutritional Needs: Kcal: 1300-1500 Protein: 55-65 grams Fluid: 1.3-1.5 L/day  Skin: dry, callus or fold near sacrum  Diet Order: Cardiac  EDUCATION NEEDS: -No education needs identified at this time   Intake/Output Summary (Last 24 hours) at 07/02/13 1155 Last data filed at 07/02/13 0600  Gross per 24 hour  Intake  3833.75 ml  Output   2400 ml  Net 1433.75 ml    Last BM: 11/4  Labs:   Recent Labs Lab 06/29/13 1724 06/30/13 0429 07/02/13 0430  NA 136 137 137  K 4.0 4.1 3.8  CL 98 104 105  CO2 29 27 26   BUN 30* 33* 14  CREATININE 1.07 0.96 0.81  CALCIUM 11.2* 10.1 9.0  GLUCOSE 129* 106* 90    CBG (last 3)  No results found for this basename: GLUCAP,  in the last 72 hours  Scheduled Meds: . amLODipine  2.5 mg Oral Daily  . beta carotene w/minerals  1 tablet Oral BID  . feeding supplement (ENSURE COMPLETE)  237 mL Oral TID BM  . loratadine  10 mg Oral Daily  . pantoprazole  40 mg Oral Daily  . vitamin B-12  1,000 mcg Oral q morning - 10a  . zolpidem  5 mg Oral QHS    Continuous Infusions:   Past Medical History  Diagnosis Date  . Arthritis   . Carpal tunnel syndrome on left   . DJD (degenerative joint disease)   . Diverticulosis   . Macular degeneration syndrome   . GERD (gastroesophageal reflux disease)   . Headache(784.0)   . Wears glasses   . Wears hearing aid     both  . Wears partial dentures     top and bottom  . High blood pressure     echo 09-normal  Past Surgical History  Procedure Laterality Date  . Appendectomy    . Hip surgery      left  . Knee surgery    . Tonsillectomy    . Thyroid cyst excision    . Eye surgery      cataractas  . Carpal tunnel release  2/12    rt  . Joint replacement  2006    rt total hip x2  . Carpal tunnel release  04/23/2012    Procedure: CARPAL TUNNEL RELEASE;  Surgeon: Nicki Reaper, MD;  Location: [facility];  Service: Orthopedics;  Laterality: Left;    Ian Malkin RD, LDN Inpatient Clinical Dietitian Pager: [phone] After Hours Pager: [phone]

## 2013-07-03 DIAGNOSIS — R339 Retention of urine, unspecified: Secondary | ICD-10-CM

## 2013-07-03 LAB — BASIC METABOLIC PANEL
BUN: 16 mg/dL (ref 6–23)
CO2: 27 mEq/L (ref 19–32)
Chloride: 102 mEq/L (ref 96–112)
GFR calc Af Amer: 81 mL/min — ABNORMAL LOW (ref >90)
GFR calc non Af Amer: 70 mL/min — ABNORMAL LOW (ref >90)
Glucose, Bld: 104 mg/dL — ABNORMAL HIGH (ref 70–99)
Potassium: 3.7 mEq/L (ref 3.5–5.1)

## 2013-07-03 LAB — CBC
HCT: 30.6 % — ABNORMAL LOW (ref 36.0–46.0)
Hemoglobin: 10.3 g/dL — ABNORMAL LOW (ref 12.0–15.0)
MCH: 30.1 pg (ref 26.0–34.0)
MCHC: 33.7 g/dL (ref 30.0–36.0)
Platelets: 194 10*3/uL (ref 150–400)
RBC: 3.42 MIL/uL — ABNORMAL LOW (ref 3.87–5.11)

## 2013-07-03 LAB — URINE MICROSCOPIC-ADD ON

## 2013-07-03 LAB — URINALYSIS, ROUTINE W REFLEX MICROSCOPIC
Bilirubin Urine: NEGATIVE
Glucose, UA: NEGATIVE mg/dL
Hgb urine dipstick: NEGATIVE
Specific Gravity, Urine: 1.018 (ref 1.005–1.030)
Urobilinogen, UA: 0.2 mg/dL (ref 0.0–1.0)
pH: 6.5 (ref 5.0–8.0)

## 2013-07-03 NOTE — Progress Notes (Signed)
[facility] PROGRESS NOTE  [name] ZOX:[id number] DOB: 10-13-1916 DOA: 06/29/2013 PCP: Ginette Otto, MD  Assessment/Plan: #1 GI bleed Likely lower GI bleed likely secondary to diverticular bleed. Patient with no bloody bowel movements today. Patient is status post 2 units of packed red blood cells. Appropriate response with hemoglobin. Patient's hemoglobin is 10.3 today. Continue Protonix. Continue supportive care. GI following and appreciate input and recommendations. Patient tolerating current regular diet.   #2 acute blood loss anemia Secondary to problem #1. Patient is status post 2 units packed red blood cells. Hemoglobin currently at 10.3 and stable. Transfusion threshold hemoglobin less than 7. Follow H&H.  #3 urinary retention Foley catheter has been discontinued. Patient is voiding.  #4 hypertension Resume home regimen of Norvasc.  #5 sinus tachycardia Likely secondary to problem #1 and 2. Improved. Norvasc has been resumed.  #6 prophylaxis PPI for GI prophylaxis. SCDs for DVT prophylaxis.  Code Status: DO NOT RESUSCITATE. Family Communication: Updated patient and granddaughter at bedside. Disposition Plan: SNF when medically stable.   Consultants:  Gastroenterology: Dr. Dorena Cookey 06/30/2013  Procedures:  2 units packed red blood cells 07/01/13  Antibiotics:  None  HPI/Subjective: Patient sitting up in chair no complaints. No shortness of breath. No chest pain. Patient states no further bleeding today. Patient is anxious to go home.  Objective: Filed Vitals:   07/03/13 1405  BP: 158/77  Pulse: 98  Temp: 98.5 F (36.9 C)  Resp: 18    Intake/Output Summary (Last 24 hours) at 07/03/13 1636 Last data filed at 07/03/13 1409  Gross per 24 hour  Intake    240 ml  Output    650 ml  Net   -410 ml   Filed Weights   06/29/13 2015  Weight: 46.6 kg (102 lb 11.8 oz)    Exam:   General:  NAD  Cardiovascular: RRR  Respiratory:  CTAB anterior lung fields  Abdomen: Soft, nontender, nondistended, positive bowel sounds.  Musculoskeletal: No clubbing cyanosis or edema.  Data Reviewed: Basic Metabolic Panel:  Recent Labs Lab 06/29/13 1724 06/30/13 0429 07/02/13 0430 07/03/13 0444  NA 136 137 137 135  K 4.0 4.1 3.8 3.7  CL 98 104 105 102  CO2 29 27 26 27   GLUCOSE 129* 106* 90 104*  BUN 30* 33* 14 16  CREATININE 1.07 0.96 0.81 0.77  CALCIUM 11.2* 10.1 9.0 9.5   Liver Function Tests:  Recent Labs Lab 06/29/13 1724  AST 21  ALT 10  ALKPHOS 70  BILITOT 0.2*  PROT 6.0  ALBUMIN 3.3*   No results found for this basename: LIPASE, AMYLASE,  in the last 168 hours No results found for this basename: AMMONIA,  in the last 168 hours CBC:  Recent Labs Lab 06/29/13 1724  06/30/13 0429 06/30/13 0948 06/30/13 1713 06/30/13 2330 07/01/13 0320 07/02/13 0430 07/03/13 0444  WBC 12.3*  --  7.7 8.3  --   --   --  7.5 11.2*  NEUTROABS 9.7*  --   --  5.4  --   --   --   --   --   HGB 11.8*  < > 9.3* 8.2* 8.0* 7.1* 7.3* 10.2* 10.3*  HCT 35.4*  < > 28.4* 24.8* 23.5* 20.7* 22.4* 30.1* 30.6*  MCV 94.4  --  95.0 95.0  --   --   --  88.5 89.5  PLT 255  --  248 222  --   --   --  174 194  < > =  values in this interval not displayed. Cardiac Enzymes: No results found for this basename: CKTOTAL, CKMB, CKMBINDEX, TROPONINI,  in the last 168 hours BNP (last 3 results) No results found for this basename: PROBNP,  in the last 8760 hours CBG: No results found for this basename: GLUCAP,  in the last 168 hours  No results found for this or any previous visit (from the past 240 hour(s)).   Studies: No results found.  Scheduled Meds: . amLODipine  2.5 mg Oral Daily  . beta carotene w/minerals  1 tablet Oral BID  . feeding supplement (ENSURE COMPLETE)  237 mL Oral TID BM  . loratadine  10 mg Oral Daily  . pantoprazole  40 mg Oral Daily  . vitamin B-12  1,000 mcg Oral q morning - 10a  . zolpidem  5 mg Oral QHS    Continuous Infusions:   Principal Problem:   GI bleed Active Problems:   Hypertension   Dementia   GERD (gastroesophageal reflux disease)   B12 deficiency   Syncope   Arrhythmia   Macular degeneration   Protein calorie malnutrition   Sinus tachycardia   Acute blood loss anemia    Time spent: 30 MINS    [facility]  [facility] Pager [phone]. If 7PM-7AM, please contact night-coverage at [email or web address], password [facility] 07/03/2013, 4:36 PM  LOS: 4 days

## 2013-07-03 NOTE — Progress Notes (Signed)
Clinical Social Work Department BRIEF PSYCHOSOCIAL ASSESSMENT 07/03/2013  Patient:  [name], [name]     Account Number:  [id number]     Admit date:  06/29/2013  Clinical Social Worker:  Orpah Greek  Date/Time:  07/03/2013 10:55 AM  Referred by:  Physician  Date Referred:  07/03/2013 Referred for  Other - See comment   Other Referral:   Admitted from: [facility]   Interview type:  Patient Other interview type:   and caregiver, Talbert Forest at bedside    PSYCHOSOCIAL DATA Living Status:  FACILITY Admitted from facility:  [facility] Level of care:  Independent Living Primary support name:  Tawanna Solo [facility]) c#: [phone] h#: [phone] Primary support relationship to patient:  NONE Degree of support available:   POA is stricktly financial and healthcare only, allows patient's family to make decisions as well -    Randye Lobo granddaughter lives in [place], [place]   [phone]     [phone]    CURRENT CONCERNS Current Concerns  Post-Acute Placement   Other Concerns:    SOCIAL WORK ASSESSMENT / PLAN CSW received call from [facility] @ [facility] (ph#: [phone]) that they recommend patient go to the SNF/Rehab before returning to her independent living apartment/cottage. Per Lupita Leash, patient's family is agreeable with plan.   Assessment/plan status:  No Further Intervention Required Other assessment/ plan:   Information/referral to community resources:    PATIENT'S/FAMILY'S RESPONSE TO PLAN OF CARE: CSW spoke with patient re: discharge plans - per PT evaluation, patient was ambulating min guard assist. Patient states that she wishes to return to her home (independent living) rather than go to SNF. Patient is alert & oriented x4 and has private duty sitters - Talbert Forest from 7:30a - 12:30p and Shirley's daughter that comes in from 5:30p - 9:30p. They have been working with her for the past 10 years.    CSW informed Lupita Leash @ [facility] of patient's  wishes to return home. CSW will fax information to [facility] for home health services to follow-up at discharge to [phone].       Unice Bailey, LCSW [facility] Clinical Social Worker cell #: [phone]

## 2013-07-03 NOTE — Progress Notes (Signed)
CSW received call from RN, Irving Burton that patient's grandaughter from [place], Harriett Sine is at bedside and would like to speak with CSW. CSW confirmed with patient (with grandaughter at bedside) her wishes to return to her independent living apartment - she is very adamant that she does not want to go to SNF/Rehab. CSW ensured grandaughter that home health services would be arranged at discharge.   Patient states that her private duty caregiver, Talbert Forest would be spending the night with her for a few nights once she is discharged to keep a close eye on her.   Unice Bailey, LCSW [facility] Clinical Social Worker cell #: [phone]

## 2013-07-03 NOTE — Progress Notes (Signed)
Tolerating regular diet, with no further bleeding and a stable hemoglobin.   No distress, sitting up in chair.   My understanding is that she will be sent to the [facility] health care unit tomorrow morning. I feel that that is appropriate.   I advised the patient and her  granddaughter at the bedside that it will sometimes occur that diverticular hemorrhage (which is our presumptive, but not confirmed, diagnosis) will recur shortly after discharge, in which case her attending physician would have to be notified and she might well have to return to the hospital.  We will sign off at this time. Please call if we can be of further assistance with this patient.  Florencia Reasons, M.D. [phone]

## 2013-07-03 NOTE — Progress Notes (Signed)
Pt plan to dc today back to [facility] who contract with [facility] for PT and make arrangements.

## 2013-07-04 ENCOUNTER — Non-Acute Institutional Stay (SKILLED_NURSING_FACILITY): Payer: Medicare Other | Admitting: Geriatric Medicine

## 2013-07-04 DIAGNOSIS — R5381 Other malaise: Secondary | ICD-10-CM | POA: Diagnosis not present

## 2013-07-04 DIAGNOSIS — K922 Gastrointestinal hemorrhage, unspecified: Secondary | ICD-10-CM

## 2013-07-04 DIAGNOSIS — E46 Unspecified protein-calorie malnutrition: Secondary | ICD-10-CM

## 2013-07-04 DIAGNOSIS — I10 Essential (primary) hypertension: Secondary | ICD-10-CM | POA: Diagnosis not present

## 2013-07-04 LAB — URINE CULTURE: Colony Count: 50000

## 2013-07-04 LAB — BASIC METABOLIC PANEL
Calcium: 9.4 mg/dL (ref 8.4–10.5)
Chloride: 104 mEq/L (ref 96–112)
GFR calc Af Amer: 81 mL/min — ABNORMAL LOW (ref >90)
GFR calc non Af Amer: 70 mL/min — ABNORMAL LOW (ref >90)
Glucose, Bld: 95 mg/dL (ref 70–99)
Potassium: 3.7 mEq/L (ref 3.5–5.1)
Sodium: 137 mEq/L (ref 135–145)

## 2013-07-04 LAB — CBC
Hemoglobin: 9.9 g/dL — ABNORMAL LOW (ref 12.0–15.0)
MCH: 30.9 pg (ref 26.0–34.0)
MCHC: 33.9 g/dL (ref 30.0–36.0)
Platelets: 217 10*3/uL (ref 150–400)
RDW: 15.5 % (ref 11.5–15.5)

## 2013-07-04 MED ORDER — ENSURE COMPLETE PO LIQD: 237.0000 mL | Freq: Three times a day (TID) | ORAL | Status: DC

## 2013-07-04 NOTE — Progress Notes (Signed)
Patient is set to discharge to [facility] SNF today. Patient is agreeable with plan now, sitter - Talbert Forest to transport to SNF. Discharge packet given to sitter & RN, Connye Burkitt made aware.   Unice Bailey, LCSW [facility] Clinical Social Worker cell #: [phone]

## 2013-07-04 NOTE — Progress Notes (Signed)
Patient ID: [name], female   DOB: September 06, 1916, 79 y.o.   MRN: [id number] [facility] [phone])  Code Status: DNR Contact Information   Name Relation Home Work [place] Other [phone]        Chief Complaint  Patient presents with  . Hospitalization Follow-up    GI Bleed    HPI:  This 79 year old female resident of [facility], independent living section was admitted to hospital 06/29/2013 with rectal bleeding. Caregivers report that patient had for several months very low p.o. intake, sensation of incomplete emptying of the bowels. Despite taking MiraLax daily she continued to strain to have bowel movements. On day of admission frank blood was noted in the toilet followed by continued bleeding from the rectum even after moving her bowels. Patient also had 2 possible syncopal events prior to hospitalization.  Patient was admitted for evaluation og lower GI bleed. Serial CBCs were followed, patient was treated with IV fluids and supportive care. GI consultation was obtained by Dr. Madilyn Fireman, he felt the patient likely had a diverticular bleed, conservative therapy was recommended. Patient's hemoglobin dropped as low as 7.1, she was transfused with 2 units of packed red blood cells. Her hemoglobin and hematocrit increased as expected. No further rectal bleeding was reported to  Discharge patient's hemoglobin was 10.3. During hospitalization patient developed urinary retention, treated with urinary catheterization. Foley catheter was discontinued after a few days, patient is voiding easily.    Patient was medically ready for discharge 07/04/2013. Recommendation was to discharge to rehabilitation section at [facility] for  PT and OT evaluation prior to returning to her independent living home. Patient has a private caregiver for several hours in the a.m. and p.m., she is alone overnight.     Allergies  Allergen Reactions  . Celebrex  [Celecoxib] Itching  . Morphine And Related Nausea Only  . Vicodin [Hydrocodone-Acetaminophen] Itching  . Sulfa Antibiotics Rash   Medications    Medication List       This list is accurate as of: 07/04/13 11:59 PM.  Always use your most recent med list.               acetaminophen 500 MG tablet  Commonly known as:  TYLENOL  Take 1,000 mg by mouth at bedtime.     amLODipine 2.5 MG tablet  Commonly known as:  NORVASC  Take 2.5 mg by mouth daily.     beta carotene w/minerals tablet  Take 1 tablet by mouth 2 (two) times daily.     CALCIUM 600 + D PO  Take 1 tablet by mouth 2 (two) times daily.     cetirizine 10 MG tablet  Commonly known as:  ZYRTEC  Take 10 mg by mouth every morning.     feeding supplement (ENSURE COMPLETE) Liqd  Take 237 mLs by mouth 3 (three) times daily between meals.     moexipril 15 MG tablet  Commonly known as:  UNIVASC  Take 15 mg by mouth every evening.     omeprazole 20 MG capsule  Commonly known as:  PRILOSEC  Take 20 mg by mouth daily with lunch.     polyethylene glycol packet  Commonly known as:  MIRALAX / GLYCOLAX  Take 17 g by mouth every evening.     vitamin B-12 1000 MCG tablet  Commonly known as:  CYANOCOBALAMIN  Take 1,000 mcg by mouth every morning.     zolpidem 10 MG tablet  Commonly known as:  AMBIEN  Take 10 mg by mouth at bedtime as needed for sleep.        DATA REVIEWED  Radiologic Exams:   Cardiovascular Exams:   Laboratory Studies  Hospital lab Lab Results  Component Value Date   WBC 6.5 07/04/2013   HGB 9.9* 07/04/2013   HCT 29.2* 07/04/2013   PLT 217 07/04/2013        GLUCOSE 95 07/04/2013   ALT 10 06/29/2013   AST 21 06/29/2013   NA 137 07/04/2013   K 3.7 07/04/2013   CL 104 07/04/2013   CREATININE 0.77 07/04/2013   BUN 13 07/04/2013   CO2 28 07/04/2013        Past Medical History  Diagnosis Date  . Arthritis   . Carpal tunnel syndrome on left   . DJD (degenerative joint disease)   .  Diverticulosis   . Macular degeneration syndrome   . GERD (gastroesophageal reflux disease)   . Headache(784.0)   . Wears glasses   . Wears hearing aid     both  . Wears partial dentures     top and bottom  . High blood pressure     echo 09-normal   Past Surgical History  Procedure Laterality Date  . Appendectomy    . Hip surgery      left  . Knee surgery    . Tonsillectomy    . Thyroid cyst excision    . Eye surgery      cataractas  . Carpal tunnel release  2/12    rt  . Joint replacement  2006    rt total hip x2  . Carpal tunnel release  04/23/2012    Procedure: CARPAL TUNNEL RELEASE;  Surgeon: Nicki Reaper, MD;  Location:  SURGERY CENTER;  Service: Orthopedics;  Laterality: Left;    Family Status  Relation Status Death Age  . Mother Deceased   . Father Deceased   . Daughter Deceased 84    MI   History   Social History Narrative   Patient is Widowed.  Occupation: [employer]  Lives in single level home ([facility]) at [facility] since 1993. Lives alone, has caregiver assistance &-12noon, 5-9pm daily [place]).    Stopped smoking 1970s. Minimal alcohol history   Patient has Advanced planning documents:  Living Will, DNR, HCPOA [place])                  Review of Systems  DATA OBTAINED: from patient, medical record GENERAL: Feels "OK"   No fevers, fatigue. Appetite is "fair... not hungry" SKIN: No itch, rash or open wounds EYES: No eye pain, dryness or itching  No change in vision EARS: No earache, tinnitus, poor hearing NOSE: No congestion, drainage or bleeding MOUTH/THROAT: No mouth or tooth pain  No sore throat   No difficulty chewing or swallowing RESPIRATORY: No cough, wheezing, SOB CARDIAC: No chest pain, palpitations  No edema. GI: No abdominal pain  No N/V/D "tend to constipation"  No heartburn or reflux  GU: No dysuria, frequency or urgency  o change in urine volume or character  voiding  easily MUSCULOSKELETAL: No joint pain, swelling or stiffness  No back pain  No muscle ache, pain, weakness  Uses a walker for ambulation, no recent falls  NEUROLOGIC: No dizziness, fainting, headache  No change in mental status.  PSYCHIATRIC: No feelings of anxiety, depression Sleeps well.  No behavior issue.    Physical Exam Filed Vitals:   07/04/13  1500  BP: 177/84  Pulse: 109  Temp: 97.3 F (36.3 C)  Resp: 19  Height: 5\' 3"  (1.6 m)  Weight: 98 lb (44.453 kg)  SpO2: 99%   Body mass index is 17.36 kg/(m^2).  GENERAL APPEARANCE: No acute distress, appropriately groomed, very thin body habitus. Alert, pleasant, conversant. SKIN: No diaphoresis, rash, bilateral lower legs with ecchymosis. (" Bruise easily") HEAD: Normocephalic, atraumatic EYES: Conjunctiva/lids clear. Pupils round, reactive. EOMs intact.  EARS: External exam WNL, canals clear. Hearing deficit is evident. NOSE: No deformity or discharge. MOUTH/THROAT: Lips w/o lesions. Oral mucosa, tongue moist, w/o lesion. Oropharynx w/o redness or lesions.  NECK: Supple, full ROM. No thyroid tenderness, enlargement or nodule LYMPHATICS: No head, neck or supraclavicular adenopathy RESPIRATORY: Breathing is even, unlabored. Lung sounds are clear and full.  CARDIOVASCULAR: Heart RRR. No murmur or extra heart sounds  ARTERIAL: No carotid bruit. Absent DP pulse   VENOUS: No varicosities. No venous stasis skin changes  EDEMA: No peripheral  edema.  GASTROINTESTINAL: Abdomen is soft, non-tender, not distended w/ hyperactive  bowel sounds. No hepatic or splenic enlargement. No mass, ventral or inguinal hernia. MUSCULOSKELETAL: Moves all extremities with full ROM, strength and tone. Back with mild kyphosis, no scoliosis or spinal process tenderness.  NEUROLOGIC: Oriented to time, place, person.  Recounted events prior to and during hospital admission Cranial nerves 2-12 grossly intact, speech clear, no tremor.  PSYCHIATRIC: Mood and affect  appropriate to situation.  Anxious to return to her independent living home  ASSESSMENT/PLAN  Hypertension Blood pressure with some elevation this afternoon, continue to monitor closely. Continue current medications  GI bleed The right lower GI bleed attributed to diverticular source. Patient was treated with 2 units packed red blood cells with improvement in hemoglobin. No further bleeding noted. Continue to monitor her BM closely. Repeat CBC 07/07/2013  Protein calorie malnutrition Patient is significantly underweight with BMI  a 17. She reports that she is just often not hungry. Will offer nutritional supplement if she consumes less than 50% of her meal.  Debility 79 year old female with limited activity status prior to acute illness, now further deconditioned after acute GI bleed and hospitalization. Will benefit from rehabilitation stay with physical and occupational therapy to maximize her functional status.  Patient is anxious to return to her independent living home stating that she would be more comfortable there. Have encouraged staying the rehabilitation section at least through the weekend so that functional status may be more closely assessed and any additional needs identified.   Follow up: As needed   Lab 1110 and 14: CBC   Lariza Cothron T.Hinata Diener, NP-C 07/04/2013

## 2013-07-04 NOTE — Discharge Summary (Signed)
Physician Discharge Summary  [name] GEX:[id number] DOB: 14-Oct-1916 DOA: 06/29/2013  PCP: Cheryl Otto, MD  Admit date: 06/29/2013 Discharge date: 07/04/2013  Time spent: 65 minutes  Recommendations for Outpatient Follow-up:  1. Followup with Cheryl Otto, MD in 1 week. On followup CBC will need to be obtained to followup on patient's hemoglobin.   Discharge Diagnoses:  Principal Problem:   GI bleed Active Problems:   Hypertension   Dementia   GERD (gastroesophageal reflux disease)   B12 deficiency   Syncope   Arrhythmia   Macular degeneration   Protein calorie malnutrition   Sinus tachycardia   Acute blood loss anemia   Discharge Condition: Stable and improved  Diet recommendation: Regular.  Filed Weights   06/29/13 2015  Weight: 46.6 kg (102 lb 11.8 oz)    History of present illness:  [name] is an 79 y.o. female with past medical history of constipation for which she takes miralax daily presents with bright red blood per rectum which started this afternoon. She has dementia, the history is given by her care givers. They state that [name] has for several months had very low PO intake, and has had the sensation of incomplete emptying of the bowels. Despite taking miralax daily she strains to have bowel movements. This afternoon she noted blood on the toilet paper, frank blood in the toilet and then continued to have bleeding from the rectum even after moving her bowels. She has not complained of pain in the abdomen or rectal area. She has been having daily small bowel movements. She has not had any nausea or vomiting. At this time there is dried blood around the rectal area, but no current bleeding.  Of note, she has also had two possible syncopal events today. The first was this afternoon while being assessed by a home health nurse she seemed to loose consciousness. The second occurred in the ED while nursing was checking orthostatic vital  signs she seemed to loose consciousness when standing from seated position. She denies any current dizziness, lightheadedness, headache.   Hospital Course:  #1 GI bleed  Likely lower GI bleed likely secondary to diverticular bleed. Patient was admitted with bright red blood per rectum. Patient was monitored on telemetry. Serial CBCs were obtained. Patient was placed on IV fluids and supportive care. GI consultation was obtained and patient was seen in consultation by Dr. Madilyn Fireman. It was felt patient likely had a diverticular bleed and to continue conservative therapy. Patient's hemoglobin dropped as low as 7.1 and patient was subsequently transfused  2 units of packed red blood cells. Appropriate response with hemoglobin. Patient is GI bleed improved resolved and patient had no further bleeding. Patient's hemoglobin improved and stabilized and on day of discharge was 9.9. Patient was maintained on Protonix and will followup with PCP as outpatient. is 10.3 today. Patient tolerating current regular diet.  #2 acute blood loss anemia  Secondary to problem #1. Patient is status post 2 units packed red blood cells. Hemoglobin currently at 9.9 and and stable.  #3 urinary retention  Patient noted an ossified patient was in urinary retention. Foley catheter was placed with good urine output. After a few days fully catheter was discontinued and patient was voiding appropriately. Patient will followup with PCP as outpatient.  #4 hypertension  Resumed on home regimen of Norvasc.  #5 sinus tachycardia  Likely secondary to problem #1 and 2. Improved. Norvasc has been resumed   Procedures: 2 units packed red blood cells  07/01/13   Consultations: Gastroenterology: Dr. Dorena Cookey 06/30/2013   Discharge Exam: Filed Vitals:   07/04/13 1009  BP: 134/72  Pulse:   Temp:   Resp:     General: NAD Cardiovascular: RRR Respiratory: CTAB  Discharge Instructions      Discharge Orders   Future Orders  Complete By Expires   Diet general  As directed    Discharge instructions  As directed    Comments:     Follow up with Cheryl Otto, MD in 1 week.   Increase activity slowly  As directed        Medication List         acetaminophen 500 MG tablet  Commonly known as:  TYLENOL  Take 1,000 mg by mouth at bedtime.     amLODipine 2.5 MG tablet  Commonly known as:  NORVASC  Take 2.5 mg by mouth daily.     beta carotene w/minerals tablet  Take 1 tablet by mouth 2 (two) times daily.     CALCIUM 600 + D PO  Take 1 tablet by mouth 2 (two) times daily.     cetirizine 10 MG tablet  Commonly known as:  ZYRTEC  Take 10 mg by mouth every morning.     feeding supplement (ENSURE COMPLETE) Liqd  Take 237 mLs by mouth 3 (three) times daily between meals.     moexipril 15 MG tablet  Commonly known as:  UNIVASC  Take 15 mg by mouth every evening.     omeprazole 20 MG capsule  Commonly known as:  PRILOSEC  Take 20 mg by mouth daily with lunch.     polyethylene glycol packet  Commonly known as:  MIRALAX / GLYCOLAX  Take 17 g by mouth every evening.     vitamin B-12 1000 MCG tablet  Commonly known as:  CYANOCOBALAMIN  Take 1,000 mcg by mouth every morning.     zolpidem 10 MG tablet  Commonly known as:  AMBIEN  Take 10 mg by mouth at bedtime as needed for sleep.       Allergies  Allergen Reactions  . Celebrex [Celecoxib] Itching  . Morphine And Related Nausea Only  . Vicodin [Hydrocodone-Acetaminophen] Itching  . Sulfa Antibiotics Rash   Follow-up Information   Follow up with Cheryl Otto, MD. Schedule an appointment as soon as possible for a visit in 1 week.   Specialty:  Internal Medicine   Contact information:   [address] [place] [address] [phone]        The results of significant diagnostics from this hospitalization (including imaging, microbiology, ancillary and laboratory) are listed below for reference.     Significant Diagnostic Studies: No results found.  Microbiology: No results found for this or any previous visit (from the past 240 hour(s)).   Labs: Basic Metabolic Panel:  Recent Labs Lab 06/29/13 1724 06/30/13 0429 07/02/13 0430 07/03/13 0444 07/04/13 0525  NA 136 137 137 135 137  K 4.0 4.1 3.8 3.7 3.7  CL 98 104 105 102 104  CO2 29 27 26 27 28   GLUCOSE 129* 106* 90 104* 95  BUN 30* 33* 14 16 13   CREATININE 1.07 0.96 0.81 0.77 0.77  CALCIUM 11.2* 10.1 9.0 9.5 9.4   Liver Function Tests:  Recent Labs Lab 06/29/13 1724  AST 21  ALT 10  ALKPHOS 70  BILITOT 0.2*  PROT 6.0  ALBUMIN 3.3*   No results found for this basename: LIPASE, AMYLASE,  in the  last 168 hours No results found for this basename: AMMONIA,  in the last 168 hours CBC:  Recent Labs Lab 06/29/13 1724  06/30/13 0429 06/30/13 0948  06/30/13 2330 07/01/13 0320 07/02/13 0430 07/03/13 0444 07/04/13 0525  WBC 12.3*  --  7.7 8.3  --   --   --  7.5 11.2* 6.5  NEUTROABS 9.7*  --   --  5.4  --   --   --   --   --   --   HGB 11.8*  < > 9.3* 8.2*  < > 7.1* 7.3* 10.2* 10.3* 9.9*  HCT 35.4*  < > 28.4* 24.8*  < > 20.7* 22.4* 30.1* 30.6* 29.2*  MCV 94.4  --  95.0 95.0  --   --   --  88.5 89.5 91.3  PLT 255  --  248 222  --   --   --  174 194 217  < > = values in this interval not displayed. Cardiac Enzymes: No results found for this basename: CKTOTAL, CKMB, CKMBINDEX, TROPONINI,  in the last 168 hours BNP: BNP (last 3 results) No results found for this basename: PROBNP,  in the last 8760 hours CBG: No results found for this basename: GLUCAP,  in the last 168 hours     Signed:  THOMPSON,DANIEL  [facility] 07/04/2013, 11:06 AM

## 2013-07-04 NOTE — Progress Notes (Signed)
Physical Therapy Treatment Patient Details Name: [name] MRN: [id number] DOB: [birth year] Today's Date: 07/04/2013 Time: 3086-5784 PT Time Calculation (min): 25 min  PT Assessment / Plan / Recommendation  History of Present Illness [name] is an 79 y.o. female with past medical history of constipation for which she takes miralax daily presents with bright red blood per rectum which started this afternoon.  She has dementia, the history is given by her care givers.  They state that [name] has for several months had very low PO intake, and has had the sensation of incomplete emptying of the bowels. Despite taking miralax daily she strains to have bowel movements.  Pt had several episodes of frank blood in the toilet and then continued to have bleeding from the rectum even after moving her bowels. She has not complained of pain in the abdomen or rectal area.   PT Comments   Pt demonstrating progress as she was able to ambulate longer distances today with less verbal cues for safety. Pt also able to perform bil LE strengthening exercises in seated and supine position. Pt states she is ready to go home. Pt would continue to benefit from skilled PT to improve functional mobility and safety.   Follow Up Recommendations  Home health PT;Supervision/Assistance - 24 hour     Does the patient have the potential to tolerate intense rehabilitation     Barriers to Discharge        Equipment Recommendations  None recommended by PT    Recommendations for Other Services    Frequency Min 3X/week   Progress towards PT Goals Progress towards PT goals: Progressing toward goals  Plan Current plan remains appropriate    Precautions / Restrictions Precautions Precautions: Fall Restrictions Weight Bearing Restrictions: No   Pertinent Vitals/Pain No c/o pain during session.    Mobility  Bed Mobility Bed Mobility: Not assessed Details for Bed Mobility Assistance: sitting EOB upon  arrival. Transfers Transfers: Sit to Stand;Stand to Sit Sit to Stand: 4: Min guard;With upper extremity assist;With armrests;From chair/3-in-1 Stand to Sit: 4: Min guard;To chair/3-in-1;With armrests;With upper extremity assist Details for Transfer Assistance: min guard to ensure safety and VC's for hand placement and to use bil UEs to push up into standing. Ambulation/Gait Ambulation/Gait Assistance: 4: Min guard Ambulation Distance (Feet): 50 Feet Assistive device: Rolling walker Ambulation/Gait Assistance Details: min guard to ensure safety. pt able to negotiate obstacles better today and required less VC's for safety. Gait Pattern: Decreased stride length;Step-through pattern;Trunk flexed Gait velocity: decreased    Exercises General Exercises - Lower Extremity Ankle Circles/Pumps: 20 reps;AROM;Seated;Both Quad Sets: AROM;20 reps;Supine;Both Short Arc Quad: 20 reps;AROM;Supine;Both (pt performed all LE exercises in 2 set of 10 with rest breaks between sets and exercises.) Long Arc Quad: AROM;Seated;Both;20 reps Heel Slides: AROM;20 reps;Supine;Both Hip ABduction/ADduction: AROM;20 reps;Supine;Both Straight Leg Raises: 20 reps;AROM;Supine;Both Hip Flexion/Marching: 20 reps;AROM;Seated;Both   PT Diagnosis:    PT Problem List:   PT Treatment Interventions:     PT Goals (current goals can now be found in the care plan section) Acute Rehab PT Goals Patient Stated Goal: to go home PT Goal Formulation: With patient Time For Goal Achievement: 07/16/13 Potential to Achieve Goals: Good  Visit Information  Last PT Received On: 07/04/13 Assistance Needed: +1 History of Present Illness: [name] is an 79 y.o. female with past medical history of constipation for which she takes miralax daily presents with bright red blood per rectum which started this afternoon.  She has dementia, the history is given by her care givers.  They state that [name] has for several months had very  low PO intake, and has had the sensation of incomplete emptying of the bowels. Despite taking miralax daily she strains to have bowel movements.  Pt had several episodes of frank blood in the toilet and then continued to have bleeding from the rectum even after moving her bowels. She has not complained of pain in the abdomen or rectal area.    Subjective Data  Patient Stated Goal: to go home   Cognition  Cognition Behavior During Therapy: WFL for tasks assessed/performed Overall Cognitive Status: History of cognitive impairments - at baseline    Balance     End of Session PT - End of Session Activity Tolerance: Patient tolerated treatment well Patient left: in chair;with call bell/phone within reach;with chair alarm set;with family/visitor present;with nursing/sitter in room Nurse Communication: Mobility status   GP     Sol Blazing 07/04/2013, 10:36 AM

## 2013-07-04 NOTE — Progress Notes (Signed)
I have reviewed this note and agree with all findings. Kati Abriana Saltos, PT, DPT Pager: [phone]   

## 2013-07-07 ENCOUNTER — Encounter: Payer: Self-pay | Admitting: Geriatric Medicine

## 2013-07-07 DIAGNOSIS — F82 Specific developmental disorder of motor function: Secondary | ICD-10-CM | POA: Diagnosis not present

## 2013-07-07 DIAGNOSIS — M6281 Muscle weakness (generalized): Secondary | ICD-10-CM | POA: Diagnosis not present

## 2013-07-07 DIAGNOSIS — K922 Gastrointestinal hemorrhage, unspecified: Secondary | ICD-10-CM | POA: Diagnosis not present

## 2013-07-07 DIAGNOSIS — R269 Unspecified abnormalities of gait and mobility: Secondary | ICD-10-CM | POA: Diagnosis not present

## 2013-07-07 DIAGNOSIS — D62 Acute posthemorrhagic anemia: Secondary | ICD-10-CM | POA: Diagnosis not present

## 2013-07-07 DIAGNOSIS — R5381 Other malaise: Secondary | ICD-10-CM | POA: Insufficient documentation

## 2013-07-07 LAB — CBC AND DIFFERENTIAL
HCT: 31 % — AB (ref 36–46)
Platelets: 223 10*3/uL (ref 150–399)

## 2013-07-07 NOTE — Assessment & Plan Note (Addendum)
79 year old female with limited activity status prior to acute illness, now further deconditioned after acute GI bleed and hospitalization. Will benefit from rehabilitation stay with physical and occupational therapy to maximize her functional status.  Patient is anxious to return to her independent living home stating that she would be more comfortable there. Have encouraged staying the rehabilitation section at least through the weekend so that functional status may be more closely assessed and any additional needs identified.

## 2013-07-07 NOTE — Assessment & Plan Note (Signed)
The right lower GI bleed attributed to diverticular source. Patient was treated with 2 units packed red blood cells with improvement in hemoglobin. No further bleeding noted. Continue to monitor her BM closely. Repeat CBC 07/07/2013

## 2013-07-07 NOTE — Assessment & Plan Note (Signed)
Patient is significantly underweight with BMI  a 17. She reports that she is just often not hungry. Will offer nutritional supplement if she consumes less than 50% of her meal.

## 2013-07-07 NOTE — Assessment & Plan Note (Signed)
Blood pressure with some elevation this afternoon, continue to monitor closely. Continue current medications

## 2013-07-08 DIAGNOSIS — F82 Specific developmental disorder of motor function: Secondary | ICD-10-CM | POA: Diagnosis not present

## 2013-07-08 DIAGNOSIS — R488 Other symbolic dysfunctions: Secondary | ICD-10-CM | POA: Diagnosis not present

## 2013-07-08 DIAGNOSIS — R55 Syncope and collapse: Secondary | ICD-10-CM | POA: Diagnosis not present

## 2013-07-08 DIAGNOSIS — R279 Unspecified lack of coordination: Secondary | ICD-10-CM | POA: Diagnosis not present

## 2013-07-08 DIAGNOSIS — M6281 Muscle weakness (generalized): Secondary | ICD-10-CM | POA: Diagnosis not present

## 2013-07-08 DIAGNOSIS — R269 Unspecified abnormalities of gait and mobility: Secondary | ICD-10-CM | POA: Diagnosis not present

## 2013-07-08 DIAGNOSIS — K922 Gastrointestinal hemorrhage, unspecified: Secondary | ICD-10-CM | POA: Diagnosis not present

## 2013-07-08 DIAGNOSIS — F0280 Dementia in other diseases classified elsewhere without behavioral disturbance: Secondary | ICD-10-CM | POA: Diagnosis not present

## 2013-07-09 DIAGNOSIS — F0280 Dementia in other diseases classified elsewhere without behavioral disturbance: Secondary | ICD-10-CM | POA: Diagnosis not present

## 2013-07-09 DIAGNOSIS — R279 Unspecified lack of coordination: Secondary | ICD-10-CM | POA: Diagnosis not present

## 2013-07-09 DIAGNOSIS — F82 Specific developmental disorder of motor function: Secondary | ICD-10-CM | POA: Diagnosis not present

## 2013-07-09 DIAGNOSIS — R488 Other symbolic dysfunctions: Secondary | ICD-10-CM | POA: Diagnosis not present

## 2013-07-09 DIAGNOSIS — M6281 Muscle weakness (generalized): Secondary | ICD-10-CM | POA: Diagnosis not present

## 2013-07-10 DIAGNOSIS — F0280 Dementia in other diseases classified elsewhere without behavioral disturbance: Secondary | ICD-10-CM | POA: Diagnosis not present

## 2013-07-10 DIAGNOSIS — R488 Other symbolic dysfunctions: Secondary | ICD-10-CM | POA: Diagnosis not present

## 2013-07-10 DIAGNOSIS — F82 Specific developmental disorder of motor function: Secondary | ICD-10-CM | POA: Diagnosis not present

## 2013-07-10 DIAGNOSIS — M6281 Muscle weakness (generalized): Secondary | ICD-10-CM | POA: Diagnosis not present

## 2013-07-10 DIAGNOSIS — R279 Unspecified lack of coordination: Secondary | ICD-10-CM | POA: Diagnosis not present

## 2013-07-11 ENCOUNTER — Encounter: Payer: Self-pay | Admitting: Geriatric Medicine

## 2013-07-11 ENCOUNTER — Non-Acute Institutional Stay (SKILLED_NURSING_FACILITY): Payer: Medicare Other | Admitting: Geriatric Medicine

## 2013-07-11 DIAGNOSIS — F82 Specific developmental disorder of motor function: Secondary | ICD-10-CM | POA: Diagnosis not present

## 2013-07-11 DIAGNOSIS — E46 Unspecified protein-calorie malnutrition: Secondary | ICD-10-CM | POA: Diagnosis not present

## 2013-07-11 DIAGNOSIS — I10 Essential (primary) hypertension: Secondary | ICD-10-CM | POA: Diagnosis not present

## 2013-07-11 DIAGNOSIS — R5381 Other malaise: Secondary | ICD-10-CM

## 2013-07-11 DIAGNOSIS — F0280 Dementia in other diseases classified elsewhere without behavioral disturbance: Secondary | ICD-10-CM | POA: Diagnosis not present

## 2013-07-11 DIAGNOSIS — D62 Acute posthemorrhagic anemia: Secondary | ICD-10-CM | POA: Diagnosis not present

## 2013-07-11 DIAGNOSIS — R488 Other symbolic dysfunctions: Secondary | ICD-10-CM | POA: Diagnosis not present

## 2013-07-11 DIAGNOSIS — M6281 Muscle weakness (generalized): Secondary | ICD-10-CM | POA: Diagnosis not present

## 2013-07-11 DIAGNOSIS — K922 Gastrointestinal hemorrhage, unspecified: Secondary | ICD-10-CM | POA: Diagnosis not present

## 2013-07-11 DIAGNOSIS — R279 Unspecified lack of coordination: Secondary | ICD-10-CM | POA: Diagnosis not present

## 2013-07-11 NOTE — Progress Notes (Signed)
Patient ID: [name], female   DOB: [birth year], 79 y.o.   MRN: [id number] [facility] [phone])  Code Status: DNR     Contact Information   Name Relation Home Work [place] Other [phone]     Powell,Kathryn Other [phone]  [phone]   Doran Clay Niece [phone]        Chief Complaint  Patient presents with  . Discharge Note    Rehab discharge  . GI Bleeding  . Debility    HPI:  This 79 year old female resident of [facility], Independent Living section was admitted to hospital 06/29/2013 with rectal bleeding. Caregivers report that patient had for several months very low p.o. intake, sensation of incomplete emptying of the bowels. Despite taking MiraLax daily she continued to strain to have bowel movements. On day of admission frank blood was noted in the toilet followed by continued bleeding from the rectum even after moving her bowels. Patient also had 2 possible syncopal events prior to hospitalization.  Patient was admitted for evaluation of lower GI bleed. Serial CBCs were followed, patient was treated with IV fluids and supportive care. GI consultation was obtained by Dr. Madilyn Fireman, he felt the patient likely had a diverticular bleed; conservative therapy was recommended. Patient's hemoglobin dropped as low as 7.1; she was transfused with 2 units of packed red blood cells. Her hemoglobin and hematocrit increased as expected. No further rectal bleeding was reported, at discharge patient's hemoglobin was 10.3.  Patient was discharged to rehabilitation section at [facility] 07/04/2013. She has been participating with  PT and OT. Home safety evaluation by OT revealed several modifications that would improve safety at home. These modifications have been completed.  Patient's caregiver Talbert Forest, has been involved in improving safety in the home, has demonstrated safe assistance for the patient. Patient's  family members have urged her to stay in the rehabilitation section until a Care Plan meeting can be held next week. However, patient is adamant she wants to return to her own home. She feels she will be much more comfortable there, has confidence Talbert Forest can provide all the care she needs.   Hospital discharge summary listed dementia as one of this patient's diagnoses. Speech therapy performed cognitive evaluation, notes short-term memory loss and difficulty with environmental cues due to her poor vision and hearing but no significant functional impairment beyond those expected in a  79 year old woman. Language skills are intact.   There has been no further rectal bleeding. Repeat CBC 07/07/2013 shows further improvement in hemoglobin and hematocrit. The patient's vital signs have been stable, her p.o. intake remains fair to poor. She continues to drink Ensure supplement 3 times a day.    Allergies  Allergen Reactions  . Celebrex [Celecoxib] Itching  . Morphine And Related Nausea Only  . Vicodin [Hydrocodone-Acetaminophen] Itching  . Sulfa Antibiotics Rash      Medication List       This list is accurate as of: 07/11/13  3:52 PM.  Always use your most recent med list.               acetaminophen 500 MG tablet  Commonly known as:  TYLENOL  Take 1,000 mg by mouth at bedtime.     amLODipine 2.5 MG tablet  Commonly known as:  NORVASC  Take 2.5 mg by mouth daily.     beta carotene w/minerals tablet  Take 1 tablet by mouth 2 (two) times daily.     CALCIUM 600 +  D PO  Take 1 tablet by mouth 2 (two) times daily.     cetirizine 10 MG tablet  Commonly known as:  ZYRTEC  Take 10 mg by mouth every morning.     feeding supplement (ENSURE COMPLETE) Liqd  Take 237 mLs by mouth 3 (three) times daily between meals.     moexipril 15 MG tablet  Commonly known as:  UNIVASC  Take 15 mg by mouth every evening.     omeprazole 20 MG capsule  Commonly known as:  PRILOSEC  Take 20 mg by  mouth daily with lunch.     polyethylene glycol packet  Commonly known as:  MIRALAX / GLYCOLAX  Take 17 g by mouth every evening.     vitamin B-12 1000 MCG tablet  Commonly known as:  CYANOCOBALAMIN  Take 1,000 mcg by mouth every morning.     zolpidem 10 MG tablet  Commonly known as:  AMBIEN  Take 10 mg by mouth at bedtime as needed for sleep.        DATA REVIEWED  Radiologic Exams:   Cardiovascular Exams:   Laboratory Studies  Hospital lab Lab Results  Component Value Date   WBC 6.5 07/04/2013   HGB 9.9* 07/04/2013   HCT 29.2* 07/04/2013   PLT 217 07/04/2013        GLUCOSE 95 07/04/2013   ALT 10 06/29/2013   AST 21 06/29/2013   NA 137 07/04/2013   K 3.7 07/04/2013   CL 104 07/04/2013   CREATININE 0.77 07/04/2013   BUN 13 07/04/2013   CO2 28 07/04/2013   Lab Results - [facility]  Component Value Date   WBC 6.7 07/07/2013   HGB 10.5* 07/07/2013   HCT 31* 07/07/2013   PLT 223 07/07/2013   GLUCOSE 95 07/04/2013   ALT 10 06/29/2013   AST 21 06/29/2013   NA 137 07/04/2013   K 3.7 07/04/2013   CL 104 07/04/2013   CREATININE 0.77 07/04/2013   BUN 13 07/04/2013   CO2 28 07/04/2013       Past Medical History  Diagnosis Date  . Arthritis   . Carpal tunnel syndrome on left   . DJD (degenerative joint disease)   . Diverticulosis   . Macular degeneration syndrome   . GERD (gastroesophageal reflux disease)   . Headache(784.0)   . Wears glasses   . Wears hearing aid     both  . Wears partial dentures     top and bottom  . High blood pressure     echo 09-normal  . Lower GI bleed 06/2013    required 2 units PRBC   Past Surgical History  Procedure Laterality Date  . Appendectomy    . Hip surgery      left  . Knee surgery    . Tonsillectomy    . Thyroid cyst excision    . Eye surgery      cataractas  . Carpal tunnel release  2/12    rt  . Joint replacement  2006    rt total hip x2  . Carpal tunnel release  04/23/2012    Procedure: CARPAL TUNNEL RELEASE;  Surgeon:  Nicki Reaper, MD;  Location: [facility];  Service: Orthopedics;  Laterality: Left;    Family Status  Relation Status Death Age  . Mother Deceased   . Father Deceased   . Daughter Deceased 43    MI   History   Social History Narrative   Patient  is Widowed.  Occupation: Housewife  Lives in single level home ([facility]) at [facility] since 1993. Lives alone, has caregiver assistance &-12noon, 5-9pm daily Talbert Forest [place]).    Stopped smoking 1970s. Minimal alcohol history   Patient has Advanced planning documents:  Living Will, DNR, HCPOA Laural Golden)                     Review of Systems  DATA OBTAINED: from patient, medical record GENERAL: Feels "very well   No fevers, fatigue. Appetite is "fair... not hungry"(not new) SKIN: No itch, rash or open wounds EYES: No eye pain, dryness or itching  Poor vision EARS: No earache, tinnitus, poor hearing NOSE: No congestion, drainage or bleeding MOUTH/THROAT: No mouth or tooth pain  No sore throat   No difficulty chewing or swallowing RESPIRATORY: No cough, wheezing, SOB CARDIAC: No chest pain, palpitations  No edema. GI: No abdominal pain  No N/V/D "tend to constipation"  No heartburn or reflux  GU: No dysuria, frequency or urgency  o change in urine volume or character  voiding easily MUSCULOSKELETAL: No joint pain, swelling or stiffness  No back pain  No muscle ache, pain, weakness  Uses a walker for ambulation, no recent falls  NEUROLOGIC: No dizziness, fainting, headache  No change in mental status. (STML) PSYCHIATRIC: No feelings of anxiety, depression Sleeps well.  No behavior issue.    Physical Exam Filed Vitals:   07/11/13 1549  BP: 148/84  Pulse: 106  Temp: 97.8 F (36.6 C)  Resp: 16  Weight: 97 lb 6.4 oz (44.18 kg)  SpO2: 100%   Body mass index is 17.26 kg/(m^2).  GENERAL APPEARANCE: No acute distress, appropriately groomed, very thin body habitus. Alert, pleasant,  conversant. SKIN: No diaphoresis, rash, bilateral lower legs with resolving ecchymosis. (" Bruise easily") HEAD: Normocephalic, atraumatic EYES: Conjunctiva/lids clear. Pupils round, reactive. EOMs intact.  EARS: External exam WNL, canals clear. Hearing deficit is evident. NOSE: No deformity or discharge. MOUTH/THROAT: Lips w/o lesions. Oral mucosa, tongue moist, w/o lesion. Oropharynx w/o redness or lesions.  NECK: Supple, full ROM. No thyroid tenderness, enlargement or nodule LYMPHATICS: No head, neck or supraclavicular adenopathy RESPIRATORY: Breathing is even, unlabored. Lung sounds are clear and full.  CARDIOVASCULAR: Heart RRR. No murmur or extra heart sounds  ARTERIAL: No carotid bruit. Absent DP pulse   VENOUS: No varicosities. No venous stasis skin changes  EDEMA: No peripheral  edema.  GASTROINTESTINAL: Abdomen is soft, non-tender, not distended w/ hyperactive  bowel sounds. No hepatic or splenic enlargement. No mass, ventral or inguinal hernia. MUSCULOSKELETAL: Moves all extremities with full ROM, strength and tone. Back with mild kyphosis, no scoliosis or spinal process tenderness.  NEUROLOGIC: Oriented to time, place, person.  Recounted events prior to and during hospital admission Cranial nerves 2-12 grossly intact, speech clear, no tremor.  PSYCHIATRIC: Mood and affect appropriate to situation.  Anxious to return to her independent living home  ASSESSMENT/PLAN  Hypertension Blood pressure is well controlled on current medication  GI bleed No further GI bleeding, improvement in hemoglobin and hematocrit at last measure.  Protein calorie malnutrition Poor p.o. intake due to lack of appetite. Continue nutritional supplement with Ensure 3 times daily  Acute blood loss anemia Improved  Debility 79 year old female with limited activity status prior to acute illness, was further deconditioned after acute GI bleed and hospitalization. Has benefitted from rehabilitation stay;  physical and occupational therapy have maximized her functional status and home safety.  Patient is  anxious to return to her independent living home stating that she would be more comfortable there. Discharge home today with caregiver    Follow up: Dr.Stoneking 1-2 weeks   Victoriah Wilds T.Aariel Ems, NP-C 07/11/2013

## 2013-07-11 NOTE — Assessment & Plan Note (Signed)
No further GI bleeding, improvement in hemoglobin and hematocrit at last measure.

## 2013-07-11 NOTE — Assessment & Plan Note (Signed)
Improved

## 2013-07-11 NOTE — Assessment & Plan Note (Signed)
79 year old female with limited activity status prior to acute illness, was further deconditioned after acute GI bleed and hospitalization. Has benefitted from rehabilitation stay; physical and occupational therapy have maximized her functional status and home safety.  Patient is anxious to return to her independent living home stating that she would be more comfortable there. Discharge home today with caregiver

## 2013-07-11 NOTE — Assessment & Plan Note (Signed)
Poor p.o. intake due to lack of appetite. Continue nutritional supplement with Ensure 3 times daily

## 2013-07-11 NOTE — Assessment & Plan Note (Signed)
Blood pressure is well controlled on current medication.

## 2013-07-14 DIAGNOSIS — R488 Other symbolic dysfunctions: Secondary | ICD-10-CM | POA: Diagnosis not present

## 2013-07-14 DIAGNOSIS — F0280 Dementia in other diseases classified elsewhere without behavioral disturbance: Secondary | ICD-10-CM | POA: Diagnosis not present

## 2013-07-14 DIAGNOSIS — R279 Unspecified lack of coordination: Secondary | ICD-10-CM | POA: Diagnosis not present

## 2013-07-14 DIAGNOSIS — M6281 Muscle weakness (generalized): Secondary | ICD-10-CM | POA: Diagnosis not present

## 2013-07-14 DIAGNOSIS — F82 Specific developmental disorder of motor function: Secondary | ICD-10-CM | POA: Diagnosis not present

## 2013-07-15 DIAGNOSIS — R279 Unspecified lack of coordination: Secondary | ICD-10-CM | POA: Diagnosis not present

## 2013-07-15 DIAGNOSIS — F82 Specific developmental disorder of motor function: Secondary | ICD-10-CM | POA: Diagnosis not present

## 2013-07-15 DIAGNOSIS — F0280 Dementia in other diseases classified elsewhere without behavioral disturbance: Secondary | ICD-10-CM | POA: Diagnosis not present

## 2013-07-15 DIAGNOSIS — M6281 Muscle weakness (generalized): Secondary | ICD-10-CM | POA: Diagnosis not present

## 2013-07-15 DIAGNOSIS — R488 Other symbolic dysfunctions: Secondary | ICD-10-CM | POA: Diagnosis not present

## 2013-07-16 DIAGNOSIS — F82 Specific developmental disorder of motor function: Secondary | ICD-10-CM | POA: Diagnosis not present

## 2013-07-16 DIAGNOSIS — M6281 Muscle weakness (generalized): Secondary | ICD-10-CM | POA: Diagnosis not present

## 2013-07-16 DIAGNOSIS — F0280 Dementia in other diseases classified elsewhere without behavioral disturbance: Secondary | ICD-10-CM | POA: Diagnosis not present

## 2013-07-16 DIAGNOSIS — R488 Other symbolic dysfunctions: Secondary | ICD-10-CM | POA: Diagnosis not present

## 2013-07-16 DIAGNOSIS — R279 Unspecified lack of coordination: Secondary | ICD-10-CM | POA: Diagnosis not present

## 2013-07-17 DIAGNOSIS — M6281 Muscle weakness (generalized): Secondary | ICD-10-CM | POA: Diagnosis not present

## 2013-07-17 DIAGNOSIS — R279 Unspecified lack of coordination: Secondary | ICD-10-CM | POA: Diagnosis not present

## 2013-07-17 DIAGNOSIS — R488 Other symbolic dysfunctions: Secondary | ICD-10-CM | POA: Diagnosis not present

## 2013-07-17 DIAGNOSIS — F0280 Dementia in other diseases classified elsewhere without behavioral disturbance: Secondary | ICD-10-CM | POA: Diagnosis not present

## 2013-07-17 DIAGNOSIS — F82 Specific developmental disorder of motor function: Secondary | ICD-10-CM | POA: Diagnosis not present

## 2013-07-21 DIAGNOSIS — F0280 Dementia in other diseases classified elsewhere without behavioral disturbance: Secondary | ICD-10-CM | POA: Diagnosis not present

## 2013-07-21 DIAGNOSIS — F82 Specific developmental disorder of motor function: Secondary | ICD-10-CM | POA: Diagnosis not present

## 2013-07-21 DIAGNOSIS — M6281 Muscle weakness (generalized): Secondary | ICD-10-CM | POA: Diagnosis not present

## 2013-07-21 DIAGNOSIS — R488 Other symbolic dysfunctions: Secondary | ICD-10-CM | POA: Diagnosis not present

## 2013-07-21 DIAGNOSIS — R279 Unspecified lack of coordination: Secondary | ICD-10-CM | POA: Diagnosis not present

## 2013-07-23 DIAGNOSIS — I10 Essential (primary) hypertension: Secondary | ICD-10-CM | POA: Diagnosis not present

## 2013-07-23 DIAGNOSIS — E441 Mild protein-calorie malnutrition: Secondary | ICD-10-CM | POA: Diagnosis not present

## 2013-07-23 DIAGNOSIS — K5731 Diverticulosis of large intestine without perforation or abscess with bleeding: Secondary | ICD-10-CM | POA: Diagnosis not present

## 2013-07-28 DIAGNOSIS — R55 Syncope and collapse: Secondary | ICD-10-CM | POA: Diagnosis not present

## 2013-07-28 DIAGNOSIS — F0280 Dementia in other diseases classified elsewhere without behavioral disturbance: Secondary | ICD-10-CM | POA: Diagnosis not present

## 2013-07-28 DIAGNOSIS — M6281 Muscle weakness (generalized): Secondary | ICD-10-CM | POA: Diagnosis not present

## 2013-07-28 DIAGNOSIS — R279 Unspecified lack of coordination: Secondary | ICD-10-CM | POA: Diagnosis not present

## 2013-08-15 DIAGNOSIS — R413 Other amnesia: Secondary | ICD-10-CM | POA: Diagnosis not present

## 2013-08-15 DIAGNOSIS — R5381 Other malaise: Secondary | ICD-10-CM | POA: Diagnosis not present

## 2013-10-08 ENCOUNTER — Encounter (HOSPITAL_COMMUNITY): Payer: Self-pay | Admitting: Emergency Medicine

## 2013-10-08 ENCOUNTER — Inpatient Hospital Stay (HOSPITAL_COMMUNITY)
Admission: EM | Admit: 2013-10-08 | Discharge: 2013-10-11 | DRG: 378 | Disposition: A | Discharge status: Skilled Nursing Facility | Payer: Medicare Other | Attending: Internal Medicine | Admitting: Internal Medicine

## 2013-10-08 DIAGNOSIS — R55 Syncope and collapse: Secondary | ICD-10-CM | POA: Diagnosis not present

## 2013-10-08 DIAGNOSIS — I10 Essential (primary) hypertension: Secondary | ICD-10-CM | POA: Diagnosis present

## 2013-10-08 DIAGNOSIS — E46 Unspecified protein-calorie malnutrition: Secondary | ICD-10-CM

## 2013-10-08 DIAGNOSIS — H3530 Unspecified macular degeneration: Secondary | ICD-10-CM | POA: Diagnosis present

## 2013-10-08 DIAGNOSIS — K5731 Diverticulosis of large intestine without perforation or abscess with bleeding: Principal | ICD-10-CM | POA: Diagnosis present

## 2013-10-08 DIAGNOSIS — Z681 Body mass index (BMI) 19 or less, adult: Secondary | ICD-10-CM | POA: Diagnosis not present

## 2013-10-08 DIAGNOSIS — F0390 Unspecified dementia without behavioral disturbance: Secondary | ICD-10-CM | POA: Diagnosis present

## 2013-10-08 DIAGNOSIS — K219 Gastro-esophageal reflux disease without esophagitis: Secondary | ICD-10-CM | POA: Diagnosis not present

## 2013-10-08 DIAGNOSIS — K625 Hemorrhage of anus and rectum: Secondary | ICD-10-CM | POA: Diagnosis not present

## 2013-10-08 DIAGNOSIS — Z79899 Other long term (current) drug therapy: Secondary | ICD-10-CM | POA: Diagnosis not present

## 2013-10-08 DIAGNOSIS — R5381 Other malaise: Secondary | ICD-10-CM | POA: Diagnosis not present

## 2013-10-08 DIAGNOSIS — Z87891 Personal history of nicotine dependence: Secondary | ICD-10-CM

## 2013-10-08 DIAGNOSIS — K922 Gastrointestinal hemorrhage, unspecified: Secondary | ICD-10-CM | POA: Diagnosis present

## 2013-10-08 DIAGNOSIS — D62 Acute posthemorrhagic anemia: Secondary | ICD-10-CM | POA: Diagnosis not present

## 2013-10-08 DIAGNOSIS — E876 Hypokalemia: Secondary | ICD-10-CM | POA: Diagnosis present

## 2013-10-08 DIAGNOSIS — Z66 Do not resuscitate: Secondary | ICD-10-CM | POA: Diagnosis present

## 2013-10-08 DIAGNOSIS — Z96649 Presence of unspecified artificial hip joint: Secondary | ICD-10-CM

## 2013-10-08 DIAGNOSIS — K921 Melena: Secondary | ICD-10-CM | POA: Diagnosis not present

## 2013-10-08 LAB — CBC
HCT: 36.7 % (ref 36.0–46.0)
Hemoglobin: 11.9 g/dL — ABNORMAL LOW (ref 12.0–15.0)
MCH: 29 pg (ref 26.0–34.0)
MCHC: 32.4 g/dL (ref 30.0–36.0)
MCV: 89.5 fL (ref 78.0–100.0)
Platelets: 336 K/uL (ref 150–400)
RBC: 4.1 MIL/uL (ref 3.87–5.11)
RDW: 14.1 % (ref 11.5–15.5)
WBC: 7.4 K/uL (ref 4.0–10.5)

## 2013-10-08 LAB — PROTIME-INR
INR: 0.92 (ref 0.00–1.49)
PROTHROMBIN TIME: 12.2 s (ref 11.6–15.2)

## 2013-10-08 LAB — BASIC METABOLIC PANEL
BUN: 22 mg/dL (ref 6–23)
CO2: 32 mEq/L (ref 19–32)
Calcium: 10.1 mg/dL (ref 8.4–10.5)
Chloride: 95 mEq/L — ABNORMAL LOW (ref 96–112)
Creatinine, Ser: 0.78 mg/dL (ref 0.50–1.10)
GFR, EST AFRICAN AMERICAN: 80 mL/min — AB (ref >90)
GFR, EST NON AFRICAN AMERICAN: 69 mL/min — AB (ref >90)
Glucose, Bld: 109 mg/dL — ABNORMAL HIGH (ref 70–99)
POTASSIUM: 3.8 meq/L (ref 3.7–5.3)
Sodium: 137 mEq/L (ref 137–147)

## 2013-10-08 LAB — OCCULT BLOOD, POC DEVICE: Fecal Occult Bld: POSITIVE — AB

## 2013-10-08 LAB — POCT I-STAT TROPONIN I: TROPONIN I, POC: 0.01 ng/mL (ref 0.00–0.08)

## 2013-10-08 MED ORDER — PANTOPRAZOLE SODIUM 40 MG PO TBEC
40.0000 mg | DELAYED_RELEASE_TABLET | Freq: Every day | ORAL | Status: DC
  Administered 2013-10-08 – 2013-10-11 (×4): 40 mg via ORAL
  Filled 2013-10-08 (×4): qty 1

## 2013-10-08 MED ORDER — ENSURE COMPLETE PO LIQD
237.0000 mL | Freq: Three times a day (TID) | ORAL | Status: DC
  Administered 2013-10-08 – 2013-10-10 (×6): 237 mL via ORAL
  Filled 2013-10-08 (×2): qty 237

## 2013-10-08 MED ORDER — SODIUM CHLORIDE 0.9 % IV SOLN
INTRAVENOUS | Status: DC
  Administered 2013-10-08 (×2): via INTRAVENOUS

## 2013-10-08 MED ORDER — ACETAMINOPHEN 325 MG PO TABS
650.0000 mg | ORAL_TABLET | Freq: Three times a day (TID) | ORAL | Status: DC | PRN
  Administered 2013-10-09 – 2013-10-10 (×2): 650 mg via ORAL
  Filled 2013-10-08 (×2): qty 2

## 2013-10-08 MED ORDER — ZOLPIDEM TARTRATE 5 MG PO TABS
5.0000 mg | ORAL_TABLET | Freq: Once | ORAL | Status: AC
  Administered 2013-10-08: 5 mg via ORAL
  Filled 2013-10-08: qty 1

## 2013-10-08 MED ORDER — SODIUM CHLORIDE 0.9 % IJ SOLN
3.0000 mL | Freq: Two times a day (BID) | INTRAMUSCULAR | Status: DC
  Administered 2013-10-08: 3 mL via INTRAVENOUS

## 2013-10-08 NOTE — Consult Note (Signed)
Referring Provider: Dr. Cruzita Lederer Primary Care Physician:  Cheryl Argyle, MD Primary Gastroenterologist:  [name]  Reason for Consultation:  Rectal bleeding  HPI: [name] is a 80 y.o. female who had 2 episodes of maroon-colored blood at home today that the caretaker saw. Her caretaker who is at the bedside said the second time she had a clot with wiping and she is unsure if any stool was with it. No complaints of abdominal pain/N/V and eating ok. No further rectal bleeding since then. Had a similar episode in November 2014 and seen at that time by Dr. Amedeo Bennett and episode then was thought to be diverticular and conservative management was done. Her friend at the bedside is unaware if she has ever had a colonoscopy. Hgb 11.9 (10.5 on 07/07/13). No melena or hematemesis. [name] is unable to give me any history of the bleeding but is oriented to person and place and says it is "2014."   Past Medical History  Diagnosis Date  . Arthritis   . Carpal tunnel syndrome on left   . DJD (degenerative joint disease)   . Diverticulosis   . Macular degeneration syndrome   . GERD (gastroesophageal reflux disease)   . Headache(784.0)   . Wears glasses   . Wears hearing aid     both  . Wears partial dentures     top and bottom  . High blood pressure     echo 09-normal  . Lower GI bleed 06/2013    required 2 units PRBC    Past Surgical History  Procedure Laterality Date  . Appendectomy    . Hip surgery      left  . Knee surgery    . Tonsillectomy    . Thyroid cyst excision    . Eye surgery      cataractas  . Carpal tunnel release  2/12    rt  . Joint replacement  2006    rt total hip x2  . Carpal tunnel release  04/23/2012    Procedure: CARPAL TUNNEL RELEASE;  Surgeon: Cheryl Sours, MD;  Location: [place];  Service: Orthopedics;  Laterality: Left;    Prior to Admission medications   Medication Sig Start Date End Date Taking? Authorizing Provider   acetaminophen (TYLENOL) 500 MG tablet Take 1,000 mg by mouth at bedtime.    Yes Historical Provider, MD  amLODipine (NORVASC) 2.5 MG tablet Take 2.5 mg by mouth daily.   Yes Historical Provider, MD  beta carotene w/minerals (OCUVITE) tablet Take 1 tablet by mouth 2 (two) times daily.   Yes Historical Provider, MD  Calcium Carb-Cholecalciferol (CALCIUM 600 + D PO) Take 1 tablet by mouth 2 (two) times daily.   Yes Historical Provider, MD  cetirizine (ZYRTEC) 10 MG tablet Take 10 mg by mouth every morning.    Yes Historical Provider, MD  feeding supplement, ENSURE COMPLETE, (ENSURE COMPLETE) LIQD Take 237 mLs by mouth 3 (three) times daily between meals. 07/04/13  Yes Cheryl Filler, MD  moexipril (UNIVASC) 15 MG tablet Take 15 mg by mouth every evening.   Yes Historical Provider, MD  omeprazole (PRILOSEC) 20 MG capsule Take 20 mg by mouth daily with lunch.    Yes Historical Provider, MD  polyethylene glycol (MIRALAX / GLYCOLAX) packet Take 17 g by mouth every evening.    Yes Historical Provider, MD  vitamin B-12 (CYANOCOBALAMIN) 1000 MCG tablet Take 1,000 mcg by mouth daily.    Yes Historical Provider, MD  zolpidem (  AMBIEN) 10 MG tablet Take 10 mg by mouth at bedtime as needed for sleep.    Yes Historical Provider, MD    Scheduled Meds: . feeding supplement (ENSURE COMPLETE)  237 mL Oral TID BM  . pantoprazole  40 mg Oral Daily  . sodium chloride  3 mL Intravenous Q12H   Continuous Infusions: . sodium chloride 50 mL/hr at 10/08/13 1915   PRN Meds:.acetaminophen  Allergies as of 10/08/2013 - Review Complete 10/08/2013  Allergen Reaction Noted  . Celebrex [celecoxib] Itching 03/30/2013  . Morphine and related Nausea Only 04/18/2012  . Vicodin [hydrocodone-acetaminophen] Itching 03/30/2013  . Sulfa antibiotics Rash 04/18/2012    History reviewed. No pertinent family history.  History   Social History  . Marital Status: Single    Spouse Name: N/A    Number of Children: N/A  .  Years of Education: N/A   Occupational History  . Not on file.   Social History Main Topics  . Smoking status: Former Smoker    Quit date: 07/07/1973  . Smokeless tobacco: Never Used  . Alcohol Use: Yes  . Drug Use: Not on file  . Sexual Activity: No   Other Topics Concern  . Not on file   Social History Narrative   Patient is Widowed.  Occupation: Housewife  Lives in single level home ([facility]) at [place] since 1993. Lives alone, has caregiver assistance &-12noon, 5-9pm daily [name]).    Stopped smoking 1970s. Minimal alcohol history   Patient has Advanced planning documents:  Living Will, DNR, HCPOA [name])                      Review of Systems: Unable to obtain from patient  Physical Exam: Vital signs: Filed Vitals:   10/08/13 1900  BP: 168/92  Pulse: 87  Resp: 18     General:   Elderly, frail, hard of healing, cachetic, awake, demented Lungs:  Clear throughout to auscultation.   No wheezes, crackles, or rhonchi. No acute distress. Heart:  Regular rate and rhythm; no murmurs, clicks, rubs,  or gallops. Abdomen: right-sided distention and tenderness with guarding, soft, nondistended, +BS  Rectal:  Deferred Ext: no edema  GI:  Lab Results:  Recent Labs  10/08/13 1510  WBC 7.4  HGB 11.9*  HCT 36.7  PLT 336   BMET  Recent Labs  10/08/13 1510  NA 137  K 3.8  CL 95*  CO2 32  GLUCOSE 109*  BUN 22  CREATININE 0.78  CALCIUM 10.1   LFT No results found for this basename: PROT, ALBUMIN, AST, ALT, ALKPHOS, BILITOT, BILIDIR, IBILI,  in the last 72 hours PT/INR  Recent Labs  10/08/13 1510  LABPROT 12.2  INR 0.92     Studies/Results: No results found.  Impression/Plan: 80 yo with painless hematochezia likely due to a diverticular source. Malignancy also possible. However, due to her advanced age would recommend conservative management unless bleeding continues. If abdominal pain persisting, then  may need an abdominal/pelvic CT for prognostic reasons. Dr. Cristina Gong available to see this week if bleeding recurs but will see as needed otherwise. Keep on clears and bleeding resolves ok to advance tomorrow. Please call us tomorrow if bleeding returns for any additional recs. F/U in office prn.    LOS: 0 days   [place] C.  10/08/2013, 9:39 PM

## 2013-10-08 NOTE — H&P (Signed)
History and Physical    [name] YWV:[id number] DOB: 1916-12-09 DOA: 10/08/2013  Referring physician: Dr. Mingo Amber PCP: Mathews Argyle, MD  Specialists: [facility] gastroenterology  Chief Complaint: GI bleed  This note has been created with Dragon speech recognition software and smart phrase technology. Any transcriptional errors are unintentional.    HPI: [name] is a 80 y.o. female has a past medical history significant for hypertension, recent hospitalization in November of last year for lower GI bleeds requiring 2 units of packed RBCs, presents to the emergency room with the chief complaint of bright red blood per rectum mixed with stool this morning. Patient has  a mild degree of underlying dementia and is not the entirely sure why she is in the hospital. For me, patient has no complaints. She denies any fever or chills. She denies any chest pain or breathing difficulties. She has no abdominal pain nausea vomiting or diarrhea. She denies any lightheadedness or dizziness. Per her caregiver, this has happened last November. At that time she was hospitalized. Gastroenterology has been consulted, however patient stopped bleeding and it was decided for conservative management. She currently takes no aspirin or other NSAIDs. Occasional Tylenol for headache.  Review of Systems: As per history of present illness, otherwise negative  Past Medical History  Diagnosis Date  . Arthritis   . Carpal tunnel syndrome on left   . DJD (degenerative joint disease)   . Diverticulosis   . Macular degeneration syndrome   . GERD (gastroesophageal reflux disease)   . Headache(784.0)   . Wears glasses   . Wears hearing aid     both  . Wears partial dentures     top and bottom  . High blood pressure     echo 09-normal  . Lower GI bleed 06/2013    required 2 units PRBC   Past Surgical History  Procedure Laterality Date  . Appendectomy    . Hip surgery      left  . Knee surgery      . Tonsillectomy    . Thyroid cyst excision    . Eye surgery      cataractas  . Carpal tunnel release  2/12    rt  . Joint replacement  2006    rt total hip x2  . Carpal tunnel release  04/23/2012    Procedure: CARPAL TUNNEL RELEASE;  Surgeon: Wynonia Sours, MD;  Location: [place];  Service: Orthopedics;  Laterality: Left;   Social History:  reports that she quit smoking about 40 years ago. She has never used smokeless tobacco. She reports that she drinks alcohol. Her drug history is not on file.  Allergies  Allergen Reactions  . Celebrex [Celecoxib] Itching  . Morphine And Related Nausea Only  . Vicodin [Hydrocodone-Acetaminophen] Itching  . Sulfa Antibiotics Rash    History reviewed. No pertinent family history.  Prior to Admission medications   Medication Sig Start Date End Date Taking? Authorizing Provider  acetaminophen (TYLENOL) 500 MG tablet Take 1,000 mg by mouth at bedtime.    Yes Historical Provider, MD  amLODipine (NORVASC) 2.5 MG tablet Take 2.5 mg by mouth daily.   Yes Historical Provider, MD  beta carotene w/minerals (OCUVITE) tablet Take 1 tablet by mouth 2 (two) times daily.   Yes Historical Provider, MD  Calcium Carb-Cholecalciferol (CALCIUM 600 + D PO) Take 1 tablet by mouth 2 (two) times daily.   Yes Historical Provider, MD  cetirizine (ZYRTEC) 10 MG tablet  Take 10 mg by mouth every morning.    Yes Historical Provider, MD  feeding supplement, ENSURE COMPLETE, (ENSURE COMPLETE) LIQD Take 237 mLs by mouth 3 (three) times daily between meals. 07/04/13  Yes Eugenie Filler, MD  moexipril (UNIVASC) 15 MG tablet Take 15 mg by mouth every evening.   Yes Historical Provider, MD  omeprazole (PRILOSEC) 20 MG capsule Take 20 mg by mouth daily with lunch.    Yes Historical Provider, MD  polyethylene glycol (MIRALAX / GLYCOLAX) packet Take 17 g by mouth every evening.    Yes Historical Provider, MD  vitamin B-12 (CYANOCOBALAMIN) 1000 MCG tablet Take 1,000 mcg  by mouth daily.    Yes Historical Provider, MD  zolpidem (AMBIEN) 10 MG tablet Take 10 mg by mouth at bedtime as needed for sleep.    Yes Historical Provider, MD   Physical Exam: Filed Vitals:   10/08/13 1630 10/08/13 1700 10/08/13 1730 10/08/13 1800  BP: 155/82 165/95 166/89 164/78  Pulse: 95 93 87 89  Resp:      SpO2: 97% 97% 96% 96%    General:  No apparent distress  Eyes: PERRL, EOMI, no scleral icterus  ENT: moist oropharynx  Neck: supple, no JVD  Cardiovascular: regular rate without MRG; 2+ peripheral pulses  Respiratory: CTA biL, good air movement without wheezing, rhonchi or crackled  Abdomen: soft, non tender to palpation, positive bowel sounds, no guarding, no rebound  Skin: no rashes  Musculoskeletal: no peripheral edema  Psychiatric: normal mood and affect  Neurologic: CN 2-12 grossly intact, MS 5/5 in all 4  Labs on Admission:  Basic Metabolic Panel:  Recent Labs Lab 10/08/13 1510  NA 137  K 3.8  CL 95*  CO2 32  GLUCOSE 109*  BUN 22  CREATININE 0.78  CALCIUM 10.1   CBC:  Recent Labs Lab 10/08/13 1510  WBC 7.4  HGB 11.9*  HCT 36.7  MCV 89.5  PLT 336   EKG: Independently reviewed.  Assessment/Plan Principal Problem:   GI bleed Active Problems:   Hypertension   Dementia   GERD (gastroesophageal reflux disease)   Protein calorie malnutrition   Debility   GI bleed - status post one episode of GI bleed prior to coming to the emergency room. No further episodes per her caregiver since. Hb was 11.9 the ED, which showed stability since last time she was hospitalized. Gastroenterology has been consulted, appreciate input. Hypertension - hold all home medications Dementia GERD Protein calorie malnutrition - restart Ensure   Diet: Liquids tonight, n.p.o. after midnight  Fluids:NS DVT Prophylaxis: SCD  Code Status: DO NOT RESUSCITATE  Family Communication: Family in the room  Disposition Plan: Inpatient  Time spent: 49  [place]  M. Cruzita Lederer, MD [facility] Pager [phone]  If 7PM-7AM, please contact night-coverage [email or web address] Password [facility] 10/08/2013, 6:48 PM

## 2013-10-08 NOTE — ED Notes (Signed)
Report called to floor waiting on pt room to be zapped and then will transport pt to floor.

## 2013-10-08 NOTE — ED Notes (Signed)
Per EMS- Patient is a resident at [facility]. Patient's caregiver noted right red blood mixed with loose brown stool today. Patient was diagnosed with diverticulitis in Nov/2014.

## 2013-10-08 NOTE — ED Notes (Signed)
Bed: RW43 Expected date:  Expected time:  Means of arrival:  Comments: Rectal bleed

## 2013-10-09 DIAGNOSIS — D62 Acute posthemorrhagic anemia: Secondary | ICD-10-CM | POA: Diagnosis not present

## 2013-10-09 DIAGNOSIS — I10 Essential (primary) hypertension: Secondary | ICD-10-CM | POA: Diagnosis not present

## 2013-10-09 DIAGNOSIS — E876 Hypokalemia: Secondary | ICD-10-CM | POA: Diagnosis present

## 2013-10-09 DIAGNOSIS — K625 Hemorrhage of anus and rectum: Secondary | ICD-10-CM

## 2013-10-09 DIAGNOSIS — E46 Unspecified protein-calorie malnutrition: Secondary | ICD-10-CM

## 2013-10-09 DIAGNOSIS — R55 Syncope and collapse: Secondary | ICD-10-CM

## 2013-10-09 LAB — BASIC METABOLIC PANEL
BUN: 15 mg/dL (ref 6–23)
CO2: 27 meq/L (ref 19–32)
CREATININE: 0.86 mg/dL (ref 0.50–1.10)
Calcium: 9.3 mg/dL (ref 8.4–10.5)
Chloride: 101 mEq/L (ref 96–112)
GFR calc Af Amer: 65 mL/min — ABNORMAL LOW (ref >90)
GFR, EST NON AFRICAN AMERICAN: 56 mL/min — AB (ref >90)
GLUCOSE: 101 mg/dL — AB (ref 70–99)
Potassium: 3.4 mEq/L — ABNORMAL LOW (ref 3.7–5.3)
SODIUM: 140 meq/L (ref 137–147)

## 2013-10-09 LAB — TYPE AND SCREEN
ABO/RH(D): O POS
Antibody Screen: POSITIVE
DAT, IgG: NEGATIVE
PT AG Type: NEGATIVE

## 2013-10-09 LAB — CBC
HCT: 35.2 % — ABNORMAL LOW (ref 36.0–46.0)
Hemoglobin: 11.1 g/dL — ABNORMAL LOW (ref 12.0–15.0)
MCH: 28.8 pg (ref 26.0–34.0)
MCHC: 31.5 g/dL (ref 30.0–36.0)
MCV: 91.2 fL (ref 78.0–100.0)
Platelets: 299 10*3/uL (ref 150–400)
RBC: 3.86 MIL/uL — AB (ref 3.87–5.11)
RDW: 14.3 % (ref 11.5–15.5)
WBC: 6 10*3/uL (ref 4.0–10.5)

## 2013-10-09 LAB — HEMOGLOBIN AND HEMATOCRIT, BLOOD
HCT: 38 % (ref 36.0–46.0)
HEMOGLOBIN: 12.1 g/dL (ref 12.0–15.0)

## 2013-10-09 LAB — MRSA PCR SCREENING: MRSA BY PCR: NEGATIVE

## 2013-10-09 MED ORDER — ZOLPIDEM TARTRATE 5 MG PO TABS
5.0000 mg | ORAL_TABLET | Freq: Once | ORAL | Status: AC
  Administered 2013-10-09: 5 mg via ORAL
  Filled 2013-10-09: qty 1

## 2013-10-09 MED ORDER — POTASSIUM CHLORIDE IN NACL 40-0.9 MEQ/L-% IV SOLN
INTRAVENOUS | Status: DC
  Administered 2013-10-09: 50 mL/h via INTRAVENOUS
  Administered 2013-10-10 – 2013-10-11 (×2): via INTRAVENOUS
  Filled 2013-10-09 (×3): qty 1000

## 2013-10-09 NOTE — Progress Notes (Signed)
Clinical Social Work Department BRIEF PSYCHOSOCIAL ASSESSMENT 10/09/2013 Late entry for 10/08/2013  Patient:  [name], [name]     Account Number:  [id number]     Admit date:  10/08/2013  Clinical Social Worker:  [employer]  Date/Time:  10/08/2013 03:30 PM  Referred by:  CSW  Date Referred:  10/08/2013 Referred for  SNF Placement   Other Referral:   Interview type:  Patient Other interview type:   patient    PSYCHOSOCIAL DATA Living Status:  ALONE Admitted from facility:  [facility] Level of care:  Independent Living Primary support name:  Luetta Nutting Primary support relationship to patient:  FAMILY Degree of support available:   Pt niece    CURRENT CONCERNS Current Concerns  Post-Acute Placement   Other Concerns:    SOCIAL WORK ASSESSMENT / PLAN CSW met with pt at bedside to complete psychosocial assessment. Patient niece and caregiver present. Patient is hard of hearing however, with CSW speaking closer to patient she was able to engage in assessment. Pt and pt family shared that patient currently lives at [place]. Per family, patient is in the process of moving to skilled nursing at [facility] for short term rehab. CSW was awaiting further medical evaluation to determine patient disposition needs.    CSW spoke with admissions Rosendo Gros, at [facility] who stated that patient was planning to move to skilled nursing and will assist wtih patient transistion. As patient is a community member, regardless of patient required 3 night inpatient stay from medicare, patient would be covered to move to skilled nursing.    Patient plans to return to [facility] when medically stable. CSW will complete FL2 for MD signature.   Assessment/plan status:  Psychosocial Support/Ongoing Assessment of Needs Other assessment/ plan:   Information/referral to community resources:   none identified at this time.    PATIENT'S/FAMILY'S RESPONSE TO PLAN OF  CARE: Patient and patient family thanked csw for concern and support. Pt plans to dc to [facility]  in SNF.       Noreene Larsson [phone]  ED CSW 10/09/2013 1530

## 2013-10-09 NOTE — Progress Notes (Signed)
PROGRESS NOTE   [name] YDX:[id number] DOB: [birth year] DOA: 10/08/2013 PCP: Mathews Argyle, MD  Brief narrative: [name] is an 80 y.o. female with a PMH of GERD, recent hospitalization 11/14 for lower GI bleeding requiring transfusion support, who was admitted on 10/08/13 with a chief complaint of maroon-colored rectal bleeding.  Assessment/Plan: Principal Problem:   GI bleed Patient was admitted and placed on bowel rest with a clear liquid diet. She was seen by Dr. Michail Sermon of gastroenterology who recommended conservative management. Bleeding most likely diverticular, but if persistent, will obtain CT scan of the abdomen and pelvis. Mild drop in hemoglobin from 11.9---> 11.1 overnight. No current indication for transfusion. Active Problems:   Hypokalemia We'll add potassium to IV fluids.   Hypertension Antihypertensives currently on hold.   Dementia / debility PT/OT evaluations.   GERD (gastroesophageal reflux disease) Continue Protonix.   Protein calorie malnutrition Advance diet when able.   DVT Prophylaxis SCDs.  Code Status: DNR Family Communication: Caregiver at bedside. Disposition Plan: Home vs. SNF depending on progress.   IV access:  Peripheral IV  Medical Consultants:  Dr. Wilford Corner, Gastroenterology  Other Consultants:  Physical therapy  Occupational therapy  Anti-infectives:  None  HPI/Subjective: [place] had a maroon-colored stool this afternoon. No complaints of abdominal pain, nausea or vomiting. No presyncope or dizziness.  Objective: Filed Vitals:   10/08/13 1900 10/08/13 2221 10/09/13 0100 10/09/13 0528  BP: 168/92 178/96 150/70 148/76  Pulse: 87 89  93  Temp:  98 F (36.7 C)  98.2 F (36.8 C)  TempSrc:  Oral  Oral  Resp:  20  18  Height:  5\' 1"  (1.549 m)    Weight:  43.636 kg (96 lb 3.2 oz)    SpO2: 97% 100%  99%    Intake/Output Summary (Last 24 hours) at 10/09/13 1114 Last data filed at  10/09/13 0830  Gross per 24 hour  Intake  537.5 ml  Output   1550 ml  Net -1012.5 ml    Exam: Gen:  NAD Cardiovascular:  RRR, No M/R/G Respiratory:  Lungs CTAB Gastrointestinal:  Abdomen soft, NT/ND, + BS Extremities:  No C/E/C  Data Reviewed: Basic Metabolic Panel:  Recent Labs Lab 10/08/13 1510 10/09/13 0502  NA 137 140  K 3.8 3.4*  CL 95* 101  CO2 32 27  GLUCOSE 109* 101*  BUN 22 15  CREATININE 0.78 0.86  CALCIUM 10.1 9.3   GFR Estimated Creatinine Clearance: 27.5 ml/min (by C-G formula based on Cr of 0.86).  Coagulation profile  Recent Labs Lab 10/08/13 1510  INR 0.92    CBC:  Recent Labs Lab 10/08/13 1510 10/09/13 0502  WBC 7.4 6.0  HGB 11.9* 11.1*  HCT 36.7 35.2*  MCV 89.5 91.2  PLT 336 299   Microbiology Recent Results (from the past 240 hour(s))  MRSA PCR SCREENING     Status: None   Collection Time    10/08/13 10:37 PM      Result Value Ref Range Status   MRSA by PCR NEGATIVE  NEGATIVE Final   Comment:            The GeneXpert MRSA Assay (FDA     approved for NASAL specimens     only), is one component of a     comprehensive MRSA colonization     surveillance program. It is not     intended to diagnose MRSA     infection nor to guide or  monitor treatment for     MRSA infections.     Procedures and Diagnostic Studies: No results found.  Scheduled Meds: . feeding supplement (ENSURE COMPLETE)  237 mL Oral TID BM  . pantoprazole  40 mg Oral Daily  . sodium chloride  3 mL Intravenous Q12H   Continuous Infusions: . sodium chloride 50 mL/hr at 10/08/13 2229    Time spent: 35 minutes with > 50% of time discussing current diagnostic test results, clinical impression and plan of care.    LOS: 1 day   RAMA,CHRISTINA  [facility] Pager [phone]. If unable to reach me by pager, please call my cell phone at [phone].  *Please note that the hospitalists switch teams on Wednesdays. Please call the flow manager at  [phone] if you are having difficulty reaching the hospitalist taking care of this patient as she can update you and provide the most up-to-date pager number of provider caring for the patient. If 8PM-8AM, please contact night-coverage at [email or web address], password [email or web address]  10/09/2013, 11:14 AM    **Disclaimer: This note was dictated with voice recognition software. Similar sounding words can inadvertently be transcribed and this note may contain transcription errors which may not have been corrected upon publication of note.**

## 2013-10-09 NOTE — Care Management Note (Signed)
    Page 1 of 1   10/09/2013     1:13:39 PM   CARE MANAGEMENT NOTE 10/09/2013  Patient:  [name], [name]   Account Number:  [id number]  Date Initiated:  10/09/2013  Documentation initiated by:  Dessa Phi  Subjective/Objective Assessment:   80 Y/O F ADMITTED W/GIB.     Action/Plan:   FROM [facility]INDEP [facility].   Anticipated DC Date:  10/11/2013   Anticipated DC Plan:  [place]  CM consult      Choice offered to / List presented to:             Status of service:  In process, will continue to follow Medicare Important Message given?   (If response is "NO", the following Medicare IM given date fields will be blank) Date Medicare IM given:   Date Additional Medicare IM given:    Discharge Disposition:    Per UR Regulation:  Reviewed for med. necessity/level of care/duration of stay  If discussed at Long Length of Stay Meetings, dates discussed:    Comments:  10/09/13 Raden Byington RN,BSN NCM [phone] GI CONS.AWAIT PT/OT RECOMMENDATIONS.

## 2013-10-09 NOTE — Progress Notes (Signed)
Clinical Social Work Department CLINICAL SOCIAL WORK PLACEMENT NOTE 10/09/2013  Patient:  [name], [name]  Account Number:  [id number] Admit date:  10/08/2013  Clinical Social Worker:  [employer]  Date/time:  10/09/2013 03:49 PM  Clinical Social Work is seeking post-discharge placement for this patient at the following level of care:   SKILLED NURSING   (*CSW will update this form in Epic as items are completed)   10/09/2013  Patient/family provided with [place] Department of Clinical Social Work's list of facilities offering this level of care within the geographic area requested by the patient (or if unable, by the patient's family).  10/09/2013  Patient/family informed of their freedom to choose among providers that offer the needed level of care, that participate in Medicare, Medicaid or managed care program needed by the patient, have an available bed and are willing to accept the patient.  10/09/2013  Patient/family informed of [facility]' ownership interest in [facility], as well as of the fact that they are under no obligation to receive care at this facility.  PASARR submitted to [facility] on  PASARR number received from [place] on   FL2 transmitted to all facilities in geographic area requested by pt/family on  10/09/2013 FL2 transmitted to all facilities within larger geographic area on   Patient informed that his/her managed care company has contracts with or will negotiate with  certain facilities, including the following:     Patient/family informed of bed offers received:  10/09/2013 Patient chooses bed at [facility] Physician recommends and patient chooses bed at    Patient to be transferred to  on   Patient to be transferred to facility by   The following physician request were entered in Epic:   Additional Comments: Patient from [facility] plan to go to snf. No pasarr needed.  Noreene Larsson [phone]  ED CSW 10/09/2013 1550pm

## 2013-10-09 NOTE — Progress Notes (Signed)
INITIAL NUTRITION ASSESSMENT  DOCUMENTATION CODES Per approved criteria  -Underweight   INTERVENTION: Recommend to continue with Ensure Complete TID Diet advancement per MD  NUTRITION DIAGNOSIS: Increased nutrient needs (protein/kcal) related to increased demand for nutrients as evidenced by underweight.   Goal: Pt to meet >/= 90% of their estimated nutrition needs    Monitor:  Total protein/energy intake, labs, GI profile, weights  Reason for Assessment: Underweight BMI  80 y.o. female  Admitting Dx: GI bleed  ASSESSMENT: [name] is a 80 y.o. female has a past medical history significant for hypertension, recent hospitalization in November of last year for lower GI bleeds requiring 2 units of packed RBCs, presents to the emergency room with the chief complaint of bright red blood per rectum mixed with stool this morning. Patient has a mild degree of underlying dementia and is not the entirely sure why she is in the hospital. For me, patient has no complaints. She denies any fever or chills. She denies any chest pain or breathing difficulties. She has no abdominal pain nausea vomiting or diarrhea. She denies any lightheadedness or dizziness. Per her caregiver, this has happened last November. At that time she was hospitalized  -Pt reported eating 3 meals/day -Diet recall indicates breakfast of eggs/toast/coffe, lunch of half of sandwich/fruit and lunch of salad/entree and dessert -Drinks 3 Ensure/day. Prefers vanilla flavors -Usual body weight around 100 lbs. Denied any changes in weight or appetite pta. Feels that she has lost a 1-2 lbs d/t liquid diet -Consuming 100% of full liquid diet. Received one Ensure yesterday and drank 100%   Height: Ht Readings from Last 1 Encounters:  10/08/13 5\' 1"  (1.549 m)    Weight: Wt Readings from Last 1 Encounters:  10/08/13 96 lb 3.2 oz (43.636 kg)    Ideal Body Weight: 105 lbs  % Ideal Body Weight: 91%  Wt Readings from  Last 10 Encounters:  10/08/13 96 lb 3.2 oz (43.636 kg)  07/11/13 97 lb 6.4 oz (44.18 kg)  07/04/13 98 lb (44.453 kg)  06/29/13 102 lb 11.8 oz (46.6 kg)  04/23/12 103 lb (46.72 kg)  04/23/12 103 lb (46.72 kg)    Usual Body Weight: 100 lbs  % Usual Body Weight: 96%  BMI:  Body mass index is 18.19 kg/(m^2).  Estimated Nutritional Needs: Kcal: 1300-1500 Protein: 55-65 Fluid: 1500 ml/daily  Skin: WDL  Diet Order: Full Liquid  EDUCATION NEEDS: -No education needs identified at this time   Intake/Output Summary (Last 24 hours) at 10/09/13 1555 Last data filed at 10/09/13 1215  Gross per 24 hour  Intake  777.5 ml  Output   2000 ml  Net -1222.5 ml    Last BM: 2/11   Labs:   Recent Labs Lab 10/08/13 1510 10/09/13 0502  NA 137 140  K 3.8 3.4*  CL 95* 101  CO2 32 27  BUN 22 15  CREATININE 0.78 0.86  CALCIUM 10.1 9.3  GLUCOSE 109* 101*    CBG (last 3)  No results found for this basename: GLUCAP,  in the last 72 hours  Scheduled Meds: . feeding supplement (ENSURE COMPLETE)  237 mL Oral TID BM  . pantoprazole  40 mg Oral Daily  . sodium chloride  3 mL Intravenous Q12H    Continuous Infusions: . 0.9 % NaCl with KCl 40 mEq / L 50 mL/hr (10/09/13 1252)    Past Medical History  Diagnosis Date  . Arthritis   . Carpal tunnel syndrome on left   .  DJD (degenerative joint disease)   . Diverticulosis   . Macular degeneration syndrome   . GERD (gastroesophageal reflux disease)   . Headache(784.0)   . Wears glasses   . Wears hearing aid     both  . Wears partial dentures     top and bottom  . High blood pressure     echo 09-normal  . Lower GI bleed 06/2013    required 2 units PRBC    Past Surgical History  Procedure Laterality Date  . Appendectomy    . Hip surgery      left  . Knee surgery    . Tonsillectomy    . Thyroid cyst excision    . Eye surgery      cataractas  . Carpal tunnel release  2/12    rt  . Joint replacement  2006    rt total  hip x2  . Carpal tunnel release  04/23/2012    Procedure: CARPAL TUNNEL RELEASE;  Surgeon: Wynonia Sours, MD;  Location: [facility];  Service: Orthopedics;  Laterality: Left;     Atlee Abide MS RD [place] Clinical Dietitian [phone]

## 2013-10-09 NOTE — ED Provider Notes (Signed)
CSN: [id number]     Arrival date & time 10/08/13  1340 History   First MD Initiated Contact with Patient 10/08/13 1344     Chief Complaint  Patient presents with  . Rectal Bleeding     (Consider location/radiation/quality/duration/timing/severity/associated sxs/prior Treatment) Patient is a 80 y.o. female presenting with hematochezia. The history is provided by the patient.  Rectal Bleeding Quality:  Bright red Amount:  Moderate Timing:  Intermittent Progression:  Unchanged Chronicity:  New Context: spontaneously   Context: not anal penetration, not defecation, not hemorrhoids and not rectal pain   Similar prior episodes: yes (had diverticulitis afew month ago)   Relieved by:  Nothing Worsened by:  Nothing tried Ineffective treatments:  None tried Associated symptoms: no abdominal pain, no dizziness, no epistaxis, no fever, no light-headedness and no vomiting     Past Medical History  Diagnosis Date  . Arthritis   . Carpal tunnel syndrome on left   . DJD (degenerative joint disease)   . Diverticulosis   . Macular degeneration syndrome   . GERD (gastroesophageal reflux disease)   . Headache(784.0)   . Wears glasses   . Wears hearing aid     both  . Wears partial dentures     top and bottom  . High blood pressure     echo 09-normal  . Lower GI bleed 06/2013    required 2 units PRBC   Past Surgical History  Procedure Laterality Date  . Appendectomy    . Hip surgery      left  . Knee surgery    . Tonsillectomy    . Thyroid cyst excision    . Eye surgery      cataractas  . Carpal tunnel release  2/12    rt  . Joint replacement  2006    rt total hip x2  . Carpal tunnel release  04/23/2012    Procedure: CARPAL TUNNEL RELEASE;  Surgeon: Wynonia Sours, MD;  Location: [place];  Service: Orthopedics;  Laterality: Left;   History reviewed. No pertinent family history. History  Substance Use Topics  . Smoking status: Former Smoker    Quit date:  07/07/1973  . Smokeless tobacco: Never Used  . Alcohol Use: Yes   OB History   Grav Para Term Preterm Abortions TAB SAB Ect Mult Living                 Review of Systems  Constitutional: Negative for fever.  HENT: Negative for nosebleeds.   Respiratory: Negative for cough and shortness of breath.   Gastrointestinal: Positive for hematochezia. Negative for vomiting and abdominal pain.  Neurological: Negative for dizziness and light-headedness.  All other systems reviewed and are negative.      Allergies  Celebrex; Morphine and related; Vicodin; and Sulfa antibiotics  Home Medications  No current outpatient prescriptions on file. BP 179/82  Pulse 92  Temp(Src) 98.3 F (36.8 C) (Oral)  Resp 16  Ht 5\' 1"  (1.549 m)  Wt 96 lb 3.2 oz (43.636 kg)  BMI 18.19 kg/m2  SpO2 98% Physical Exam  Nursing note and vitals reviewed. Constitutional: She is oriented to person, place, and time. She appears well-developed and well-nourished. No distress.  HENT:  Head: Normocephalic and atraumatic.  Eyes: EOM are normal. Pupils are equal, round, and reactive to light.  Neck: Normal range of motion. Neck supple.  Cardiovascular: Normal rate and regular rhythm.  Exam reveals no friction rub.   No murmur heard. Pulmonary/Chest:  Effort normal and breath sounds normal. No respiratory distress. She has no wheezes. She has no rales.  Abdominal: Soft. She exhibits no distension. There is no tenderness. There is no rebound.  Genitourinary: Guaiac positive stool (gross blood on rectal exam).  Musculoskeletal: Normal range of motion. She exhibits no edema.  Neurological: She is alert and oriented to person, place, and time.  Skin: She is not diaphoretic.    ED Course  Procedures (including critical care time) Labs Review Labs Reviewed  CBC - Abnormal; Notable for the following:    Hemoglobin 11.9 (*)    All other components within normal limits  BASIC METABOLIC PANEL - Abnormal; Notable for  the following:    Chloride 95 (*)    Glucose, Bld 109 (*)    GFR calc non Af Amer 69 (*)    GFR calc Af Amer 80 (*)    All other components within normal limits  BASIC METABOLIC PANEL - Abnormal; Notable for the following:    Potassium 3.4 (*)    Glucose, Bld 101 (*)    GFR calc non Af Amer 56 (*)    GFR calc Af Amer 65 (*)    All other components within normal limits  CBC - Abnormal; Notable for the following:    RBC 3.86 (*)    Hemoglobin 11.1 (*)    HCT 35.2 (*)    All other components within normal limits  OCCULT BLOOD, POC DEVICE - Abnormal; Notable for the following:    Fecal Occult Bld POSITIVE (*)    All other components within normal limits  MRSA PCR SCREENING  PROTIME-INR  POCT I-STAT TROPONIN I  TYPE AND SCREEN   Imaging Review No results found.    MDM   Final diagnoses:  Rectal bleeding    40F here with bloody stools. 2 episodes over past day. No abdominal pain, vomiting, diarrhea. No fevers. Similar to prior episode of diverticulitis, had scope a few months ago.  Gross blood on rectal exam, otherwise exam normal. I spoke with GI, who will be available for medicine. Patient admitted to medicine.     Osvaldo Shipper, MD 10/09/13 [phone]

## 2013-10-09 NOTE — Evaluation (Signed)
Physical Therapy Evaluation Patient Details Name: [name] MRN: [id number] DOB: March 14, 1917 Today's Date: 10/09/2013 Time: 5093-2671 PT Time Calculation (min): 21 min  PT Assessment / Plan / Recommendation History of Present Illness  [name] is a 80 y.o. female has a past medical history significant for hypertension, recent hospitalization in November of last year for lower GI bleeds requiring 2 units of packed RBCs, presents to the emergency room with the chief complaint of bright red blood per rectum mixed with stool this morning. Patient has  a mild degree of underlying dementia and is not the entirely sure why she is in the hospital. For me, patient has no complaints. She denies any fever or chills. She denies any chest pain or breathing difficulties. She has no abdominal pain nausea vomiting or diarrhea. She denies any lightheadedness or dizziness. Per her caregiver, this has happened last November. At that time she was hospitalized. Gastroenterology has been consulted, however patient stopped bleeding and it was decided for conservative management  Clinical Impression  Pt  Ambulated  With RW in room. Noted pt did have  Some dark drainage after toileting.Per chart, plan is for pt to go to skilled  Bed at [facility]. Pt will benefit from PT to address problems.     PT Assessment  Patient needs continued PT services    Follow Up Recommendations  SNF    Does the patient have the potential to tolerate intense rehabilitation      Barriers to Discharge        Equipment Recommendations  None recommended by PT    Recommendations for Other Services     Frequency Min 3X/week    Precautions / Restrictions Precautions Precautions: Fall   Pertinent Vitals/Pain No problems.      Mobility  Transfers Equipment used: Rolling walker (2 wheeled) Ambulation/Gait Ambulation/Gait assistance: Min assist Ambulation Distance (Feet): 40 Feet Assistive device: Rolling walker (2  wheeled) Gait Pattern/deviations: Step-through pattern;Trunk flexed Gait velocity: WFL General Gait Details: pt able to [facility] the RW around objects and turns.    Exercises     PT Diagnosis: Difficulty walking;Generalized weakness  PT Problem List: Decreased strength;Decreased activity tolerance;Decreased knowledge of use of DME;Decreased safety awareness;Decreased knowledge of precautions;Decreased balance;Decreased mobility PT Treatment Interventions: DME instruction;Gait training;Functional mobility training;Therapeutic activities;Therapeutic exercise;Patient/family education     PT Goals(Current goals can be found in the care plan section) Acute Rehab PT Goals Patient Stated Goal: i wanted to take a nap. PT Goal Formulation: With patient Time For Goal Achievement: 10/23/13 Potential to Achieve Goals: Good  Visit Information  Last PT Received On: 10/09/13 Assistance Needed: +1 History of Present Illness: [name] is a 80 y.o. female has a past medical history significant for hypertension, recent hospitalization in November of last year for lower GI bleeds requiring 2 units of packed RBCs, presents to the emergency room with the chief complaint of bright red blood per rectum mixed with stool this morning. Patient has  a mild degree of underlying dementia and is not the entirely sure why she is in the hospital. For me, patient has no complaints. She denies any fever or chills. She denies any chest pain or breathing difficulties. She has no abdominal pain nausea vomiting or diarrhea. She denies any lightheadedness or dizziness. Per her caregiver, this has happened last November. At that time she was hospitalized. Gastroenterology has been consulted, however patient stopped bleeding and it was decided for conservative managemen  Prior [place] expects to be discharged to:: Skilled nursing facility Additional Comments: pt reports she has a  caregivers from 7:30a-12:30p and 5:30p-7:30a.    Cognition  Cognition Arousal/Alertness: Awake/alert Behavior During Therapy: WFL for tasks assessed/performed Overall Cognitive Status: History of cognitive impairments - at baseline    Extremity/Trunk Assessment Upper Extremity Assessment Upper Extremity Assessment: Generalized weakness Lower Extremity Assessment Lower Extremity Assessment: Generalized weakness Cervical / Trunk Assessment Cervical / Trunk Assessment: Kyphotic   Balance Balance Overall balance assessment: Needs assistance Standing balance support: Bilateral upper extremity supported Standing balance-Leahy Scale: Fair Standing balance comment: at [facility].  End of Session PT - End of Session Activity Tolerance: Patient tolerated treatment well Patient left: in chair;with call bell/phone within reach;with chair alarm set Nurse Communication: Mobility status  GP     Claretha Cooper 10/09/2013, 4:28 PM  Tresa Endo PT [phone]

## 2013-10-10 DIAGNOSIS — D62 Acute posthemorrhagic anemia: Secondary | ICD-10-CM | POA: Diagnosis not present

## 2013-10-10 DIAGNOSIS — K625 Hemorrhage of anus and rectum: Secondary | ICD-10-CM | POA: Diagnosis not present

## 2013-10-10 DIAGNOSIS — R55 Syncope and collapse: Secondary | ICD-10-CM | POA: Diagnosis not present

## 2013-10-10 LAB — CBC
HCT: 34.7 % — ABNORMAL LOW (ref 36.0–46.0)
Hemoglobin: 11 g/dL — ABNORMAL LOW (ref 12.0–15.0)
MCH: 28.9 pg (ref 26.0–34.0)
MCHC: 31.7 g/dL (ref 30.0–36.0)
MCV: 91.1 fL (ref 78.0–100.0)
Platelets: 306 10*3/uL (ref 150–400)
RBC: 3.81 MIL/uL — ABNORMAL LOW (ref 3.87–5.11)
RDW: 14.3 % (ref 11.5–15.5)
WBC: 6.2 10*3/uL (ref 4.0–10.5)

## 2013-10-10 LAB — BASIC METABOLIC PANEL
BUN: 15 mg/dL (ref 6–23)
CALCIUM: 9.7 mg/dL (ref 8.4–10.5)
CO2: 25 mEq/L (ref 19–32)
CREATININE: 0.79 mg/dL (ref 0.50–1.10)
Chloride: 103 mEq/L (ref 96–112)
GFR calc Af Amer: 80 mL/min — ABNORMAL LOW (ref >90)
GFR calc non Af Amer: 69 mL/min — ABNORMAL LOW (ref >90)
Glucose, Bld: 100 mg/dL — ABNORMAL HIGH (ref 70–99)
Potassium: 4.1 mEq/L (ref 3.7–5.3)
SODIUM: 140 meq/L (ref 137–147)

## 2013-10-10 MED ORDER — ZOLPIDEM TARTRATE 5 MG PO TABS
5.0000 mg | ORAL_TABLET | Freq: Every day | ORAL | Status: DC
  Administered 2013-10-10: 5 mg via ORAL
  Filled 2013-10-10: qty 1

## 2013-10-10 MED ORDER — TRANDOLAPRIL 2 MG PO TABS
2.0000 mg | ORAL_TABLET | Freq: Every day | ORAL | Status: DC
  Administered 2013-10-10 – 2013-10-11 (×2): 2 mg via ORAL
  Filled 2013-10-10 (×2): qty 1

## 2013-10-10 MED ORDER — AMLODIPINE BESYLATE 2.5 MG PO TABS
2.5000 mg | ORAL_TABLET | Freq: Every day | ORAL | Status: DC
  Administered 2013-10-10 – 2013-10-11 (×2): 2.5 mg via ORAL
  Filled 2013-10-10 (×2): qty 1

## 2013-10-10 MED ORDER — ZOLPIDEM TARTRATE 5 MG PO TABS: 5.0000 mg | ORAL_TABLET | Freq: Every day | ORAL | Status: DC

## 2013-10-10 NOTE — Progress Notes (Signed)
PROGRESS NOTE   [name] MEQ:[id number] DOB: 1916-09-21 DOA: 10/08/2013 PCP: Mathews Argyle, MD  Brief narrative: [name] is an 80 y.o. female with a PMH of GERD, recent hospitalization 11/14 for lower GI bleeding requiring transfusion support, who was admitted on 10/08/13 with a chief complaint of maroon-colored rectal bleeding.  Assessment/Plan: Principal Problem:   GI bleed Patient was admitted and placed on bowel rest with a clear liquid diet. She was seen by Dr. Michail Sermon of gastroenterology who recommended conservative management. Bleeding most likely diverticular, but if persistent, will obtain CT scan of the abdomen and pelvis. Mild drop in hemoglobin from 11.9---> 11.1--->12.1--->11.0  overnight. No current indication for transfusion.  Advance diet to regular.  D/C home 10/11/13 if no further melena/stable hemoglobin. Active Problems:   Hypokalemia Resolved with potassium added to IV fluids.   Hypertension Antihypertensives currently on hold.  Resume.   Dementia / debility PT/OT evaluations.   GERD (gastroesophageal reflux disease) Continue Protonix.   Protein calorie malnutrition Advance diet when able.   DVT Prophylaxis SCDs.  Code Status: DNR Family Communication: Caregiver at bedside. [place], Montez Hageman,  [phone] updated by telephone.   Disposition Plan: Home when stable.   IV access:  Peripheral IV  Medical Consultants:  Dr. Wilford Corner, Gastroenterology  Other Consultants:  Physical therapy  Occupational therapy  Anti-infectives:  None  HPI/Subjective: [name] has not had any further maroon-colored stools since that episode yesterday afternoon. She is sitting up and feels well otherwise. No complaints of abdominal pain, nausea or vomiting. No presyncope or dizziness.  Objective Filed Vitals:   10/09/13 1141 10/09/13 1455 10/09/13 2111 10/10/13 0453  BP: 179/82 150/86 154/83 153/74  Pulse:  90 94 98    Temp:  97.7 F (36.5 C) 97.9 F (36.6 C) 98.5 F (36.9 C)  TempSrc:  Oral Oral Oral  Resp:  16 16 18   Height:      Weight:      SpO2:  100% 99% 99%    Intake/Output Summary (Last 24 hours) at 10/10/13 0823 Last data filed at 10/10/13 0815  Gross per 24 hour  Intake   1150 ml  Output   2001 ml  Net   -851 ml    Exam: Gen:  NAD Cardiovascular:  RRR, No M/R/G Respiratory:  Lungs CTAB Gastrointestinal:  Abdomen soft, NT/ND, + BS Extremities:  No C/E/C  Data Reviewed: Basic Metabolic Panel:  Recent Labs Lab 10/08/13 1510 10/09/13 0502 10/10/13 0402  NA 137 140 140  K 3.8 3.4* 4.1  CL 95* 101 103  CO2 32 27 25  GLUCOSE 109* 101* 100*  BUN 22 15 15   CREATININE 0.78 0.86 0.79  CALCIUM 10.1 9.3 9.7   GFR Estimated Creatinine Clearance: 29.6 ml/min (by C-G formula based on Cr of 0.79).  Coagulation profile  Recent Labs Lab 10/08/13 1510  INR 0.92    CBC:  Recent Labs Lab 10/08/13 1510 10/09/13 0502 10/09/13 1845 10/10/13 0402  WBC 7.4 6.0  --  6.2  HGB 11.9* 11.1* 12.1 11.0*  HCT 36.7 35.2* 38.0 34.7*  MCV 89.5 91.2  --  91.1  PLT 336 299  --  306   Microbiology Recent Results (from the past 240 hour(s))  MRSA PCR SCREENING     Status: None   Collection Time    10/08/13 10:37 PM      Result Value Ref Range Status   MRSA by PCR NEGATIVE  NEGATIVE Final  Comment:            The GeneXpert MRSA Assay (FDA     approved for NASAL specimens     only), is one component of a     comprehensive MRSA colonization     surveillance program. It is not     intended to diagnose MRSA     infection nor to guide or     monitor treatment for     MRSA infections.     Procedures and Diagnostic Studies: No results found.  Scheduled Meds: . feeding supplement (ENSURE COMPLETE)  237 mL Oral TID BM  . pantoprazole  40 mg Oral Daily  . sodium chloride  3 mL Intravenous Q12H   Continuous Infusions: . 0.9 % NaCl with KCl 40 mEq / L 50 mL/hr at 10/10/13 0545     Time spent: 25 minutes.    LOS: 2 days   Mihran Lebarron  [facility] Pager [phone]. If unable to reach me by pager, please call my cell phone at [phone].  *Please note that the hospitalists switch teams on Wednesdays. Please call the flow manager at [phone] if you are having difficulty reaching the hospitalist taking care of this patient as she can update you and provide the most up-to-date pager number of provider caring for the patient. If 8PM-8AM, please contact night-coverage at [email or web address], password [facility]  10/10/2013, 8:23 AM    **Disclaimer: This note was dictated with voice recognition software. Similar sounding words can inadvertently be transcribed and this note may contain transcription errors which may not have been corrected upon publication of note.**

## 2013-10-10 NOTE — Progress Notes (Signed)
OT Cancellation Note  Patient Details Name: [name] MRN: [id number] DOB: [birth year]   Cancelled Treatment:    Reason Eval/Treat Not Completed: Other (comment).  Spoke to caregiver.  She helps with all adls and feels pt is near baseline.  Plan is for snf, and OT eval is likely not needed for admission.  Will defer OT eval to that venue.  Dustyn Dansereau 10/10/2013, 11:51 AM Lesle Chris, OTR/L [phone] 10/10/2013

## 2013-10-11 DIAGNOSIS — K219 Gastro-esophageal reflux disease without esophagitis: Secondary | ICD-10-CM | POA: Diagnosis not present

## 2013-10-11 DIAGNOSIS — R5381 Other malaise: Secondary | ICD-10-CM | POA: Diagnosis not present

## 2013-10-11 DIAGNOSIS — K625 Hemorrhage of anus and rectum: Secondary | ICD-10-CM | POA: Diagnosis not present

## 2013-10-11 DIAGNOSIS — D62 Acute posthemorrhagic anemia: Secondary | ICD-10-CM | POA: Diagnosis not present

## 2013-10-11 LAB — CBC
HCT: 34.8 % — ABNORMAL LOW (ref 36.0–46.0)
Hemoglobin: 11.2 g/dL — ABNORMAL LOW (ref 12.0–15.0)
MCH: 29.1 pg (ref 26.0–34.0)
MCHC: 32.2 g/dL (ref 30.0–36.0)
MCV: 90.4 fL (ref 78.0–100.0)
PLATELETS: 329 10*3/uL (ref 150–400)
RBC: 3.85 MIL/uL — ABNORMAL LOW (ref 3.87–5.11)
RDW: 14.3 % (ref 11.5–15.5)
WBC: 5.3 10*3/uL (ref 4.0–10.5)

## 2013-10-11 NOTE — Discharge Instructions (Signed)

## 2013-10-11 NOTE — Progress Notes (Signed)
Per MD, Pt ready for d/c.  Notified RN, Pt, Pt's caregiver and facility.  Sent d/c summary.  Confirmed receipt of d/c summary.  Facility ready to receive Pt.  Caregiver to provide transportation.  Bernita Raisin, [place] Work [phone]

## 2013-10-11 NOTE — Discharge Summary (Signed)
Physician Discharge Summary  [name] FBP:[id number] DOB: Sep 24, 1916 DOA: 10/08/2013  PCP: Mathews Argyle, MD  Admit date: 10/08/2013 Discharge date: 10/11/2013  Recommendations for Outpatient Follow-up:  1. F/U with PCP for a CBC check in 1 week to ensure stability of hemoglobin. 2. Recommend physical therapy at [facility].  Discharge Diagnoses:  Principal Problem:    GI bleed, presumed diverticular Active Problems:    Hypertension    Dementia    GERD (gastroesophageal reflux disease)    Protein calorie malnutrition    Debility    Hypokalemia   Discharge Condition: Stable.  Diet recommendation: Low sodium, heart healthy.  History of present illness:  [name] is an 80 y.o. female with a PMH of GERD, recent hospitalization 11/14 for lower GI bleeding requiring transfusion support, who was admitted on 10/08/13 with a chief complaint of maroon-colored rectal bleeding.  Hospital Course by problem:  Principal Problem:  GI bleed, presumed diverticular  Patient was admitted and placed on bowel rest with a clear liquid diet. She was seen by Dr. Michail Sermon of gastroenterology who recommended conservative management. Bleeding most likely diverticular, but if persistent, he recommended further evaluation with CT scan of the abdomen and pelvis (not done secondary to resolution of bleeding). Mild drop in hemoglobin from admission values, but hemoglobin rising at discharge with no evidence of ongoing bleeding. Tolerated advancement of her diet 10/10/13. Active Problems:  Hypokalemia  Resolved with potassium added to IV fluids.  Hypertension  Antihypertensives currently on hold. Resumed 10/10/13.  Dementia / debility  PT/OT evaluations done.  Recommend ongoing PT at [facility]  GERD (gastroesophageal reflux disease)  Continue Protonix.  Protein calorie malnutrition  Encourage PO intake.  DVT Prophylaxis  SCDs.  Procedures:  None.  Consultations:  Dr.  Wilford Corner, GI.  Discharge Exam: Filed Vitals:   10/11/13 0918  BP: 178/73  Pulse: 95  Temp: 98.2 F (36.8 C)  Resp:    Filed Vitals:   10/10/13 2131 10/10/13 2259 10/11/13 0504 10/11/13 0918  BP: 166/96 163/77 160/83 178/73  Pulse: 87 95 79 95  Temp: 97.4 F (36.3 C)  98.2 F (36.8 C) 98.2 F (36.8 C)  TempSrc: Oral  Oral Oral  Resp: 16  16   Height:      Weight:      SpO2: 99%  99% 99%    Gen:  NAD Cardiovascular:  RRR, No M/R/G Respiratory: Lungs CTAB Gastrointestinal: Abdomen soft, NT/ND with normal active bowel sounds. Extremities: No C/E/C   Discharge Instructions  Discharge Orders   Future Orders Complete By Expires   Call MD for:  extreme fatigue  As directed    Call MD for:  persistant nausea and vomiting  As directed    Call MD for:  As directed    Scheduling Instructions:     Recurrent blood in the stools, dizziness, or shortness of breath.   Diet - low sodium heart healthy  As directed    Increase activity slowly  As directed    Walk with assistance  As directed    Walker   As directed        Medication List         acetaminophen 500 MG tablet  Commonly known as:  TYLENOL  Take 1,000 mg by mouth at bedtime.     amLODipine 2.5 MG tablet  Commonly known as:  NORVASC  Take 2.5 mg by mouth daily.     beta carotene w/minerals tablet  Take 1  tablet by mouth 2 (two) times daily.     CALCIUM 600 + D PO  Take 1 tablet by mouth 2 (two) times daily.     cetirizine 10 MG tablet  Commonly known as:  ZYRTEC  Take 10 mg by mouth every morning.     feeding supplement (ENSURE COMPLETE) Liqd  Take 237 mLs by mouth 3 (three) times daily between meals.     moexipril 15 MG tablet  Commonly known as:  UNIVASC  Take 15 mg by mouth every evening.     omeprazole 20 MG capsule  Commonly known as:  PRILOSEC  Take 20 mg by mouth daily with lunch.     polyethylene glycol packet  Commonly known as:  MIRALAX / GLYCOLAX  Take 17 g by mouth every  evening.     vitamin B-12 1000 MCG tablet  Commonly known as:  CYANOCOBALAMIN  Take 1,000 mcg by mouth daily.     zolpidem 10 MG tablet  Commonly known as:  AMBIEN  Take 10 mg by mouth at bedtime as needed for sleep.           Follow-up Information   Follow up with Mathews Argyle, MD. Schedule an appointment as soon as possible for a visit in 1 week. (To check your blood counts.)    Specialty:  Internal Medicine   Contact information:   [address] [place] [address] [phone]        The results of significant diagnostics from this hospitalization (including imaging, microbiology, ancillary and laboratory) are listed below for reference.    Significant Diagnostic Studies: No results found.  Labs:  Basic Metabolic Panel:  Recent Labs Lab 10/08/13 1510 10/09/13 0502 10/10/13 0402  NA 137 140 140  K 3.8 3.4* 4.1  CL 95* 101 103  CO2 32 27 25  GLUCOSE 109* 101* 100*  BUN 22 15 15   CREATININE 0.78 0.86 0.79  CALCIUM 10.1 9.3 9.7   GFR Estimated Creatinine Clearance: 29.6 ml/min (by C-G formula based on Cr of 0.79).  Coagulation profile  Recent Labs Lab 10/08/13 1510  INR 0.92    CBC:  Recent Labs Lab 10/08/13 1510 10/09/13 0502 10/09/13 1845 10/10/13 0402 10/11/13 0527  WBC 7.4 6.0  --  6.2 5.3  HGB 11.9* 11.1* 12.1 11.0* 11.2*  HCT 36.7 35.2* 38.0 34.7* 34.8*  MCV 89.5 91.2  --  91.1 90.4  PLT 336 299  --  306 329   Microbiology Recent Results (from the past 240 hour(s))  MRSA PCR SCREENING     Status: None   Collection Time    10/08/13 10:37 PM      Result Value Ref Range Status   MRSA by PCR NEGATIVE  NEGATIVE Final   Comment:            The GeneXpert MRSA Assay (FDA     approved for NASAL specimens     only), is one component of a     comprehensive MRSA colonization     surveillance program. It is not     intended to diagnose MRSA     infection nor to guide or     monitor treatment for     MRSA  infections.    Time coordinating discharge: 35 minutes.  Signed:  Catera Hankins  Pager [phone] [facility] 10/11/2013, 9:21 AM

## 2013-10-13 NOTE — Clinical Social Work Placement (Signed)
     Clinical Social Work Department CLINICAL SOCIAL WORK PLACEMENT NOTE 10/13/2013  Patient:  [name], [name]  Account Number:  [id number] Admit date:  10/08/2013  Clinical Social Worker:  [employer]  Date/time:  10/09/2013 03:49 PM  Clinical Social Work is seeking post-discharge placement for this patient at the following level of care:   SKILLED NURSING   (*CSW will update this form in Epic as items are completed)   10/09/2013  Patient/family provided with [facility] Department of Clinical Social Works list of facilities offering this level of care within the geographic area requested by the patient (or if unable, by the patients family).  10/09/2013  Patient/family informed of their freedom to choose among providers that offer the needed level of care, that participate in Medicare, Medicaid or managed care program needed by the patient, have an available bed and are willing to accept the patient.  10/09/2013  Patient/family informed of [facility] ownership interest in [facility], as well as of the fact that they are under no obligation to receive care at this facility.  PASARR submitted to EDS on 10/10/2013 PASARR number received from EDS on 10/10/2013  FL2 transmitted to all facilities in geographic area requested by pt/family on  10/09/2013 FL2 transmitted to all facilities within larger geographic area on   Patient informed that his/her managed care company has contracts with or will negotiate with  certain facilities, including the following:     Patient/family informed of bed offers received:  10/09/2013 Patient chooses bed at [facility] Physician recommends and patient chooses bed at    Patient to be transferred to [facility] on  10/11/2013 Patient to be transferred to facility by ptar  The following physician request were entered in Epic:   Additional Comments: Patient from [facility] plan to go to snf. No pasarr needed.

## 2013-10-16 DIAGNOSIS — R269 Unspecified abnormalities of gait and mobility: Secondary | ICD-10-CM | POA: Diagnosis not present

## 2013-10-16 DIAGNOSIS — M6281 Muscle weakness (generalized): Secondary | ICD-10-CM | POA: Diagnosis not present

## 2013-10-16 DIAGNOSIS — D62 Acute posthemorrhagic anemia: Secondary | ICD-10-CM | POA: Diagnosis not present

## 2013-10-16 DIAGNOSIS — R279 Unspecified lack of coordination: Secondary | ICD-10-CM | POA: Diagnosis not present

## 2013-10-16 DIAGNOSIS — F0390 Unspecified dementia without behavioral disturbance: Secondary | ICD-10-CM | POA: Diagnosis not present

## 2013-10-16 DIAGNOSIS — H547 Unspecified visual loss: Secondary | ICD-10-CM | POA: Diagnosis not present

## 2013-10-16 DIAGNOSIS — K922 Gastrointestinal hemorrhage, unspecified: Secondary | ICD-10-CM | POA: Diagnosis not present

## 2013-10-17 DIAGNOSIS — R279 Unspecified lack of coordination: Secondary | ICD-10-CM | POA: Diagnosis not present

## 2013-10-17 DIAGNOSIS — R269 Unspecified abnormalities of gait and mobility: Secondary | ICD-10-CM | POA: Diagnosis not present

## 2013-10-17 DIAGNOSIS — H547 Unspecified visual loss: Secondary | ICD-10-CM | POA: Diagnosis not present

## 2013-10-17 DIAGNOSIS — F0390 Unspecified dementia without behavioral disturbance: Secondary | ICD-10-CM | POA: Diagnosis not present

## 2013-10-17 DIAGNOSIS — K922 Gastrointestinal hemorrhage, unspecified: Secondary | ICD-10-CM | POA: Diagnosis not present

## 2013-10-17 DIAGNOSIS — M6281 Muscle weakness (generalized): Secondary | ICD-10-CM | POA: Diagnosis not present

## 2013-10-20 DIAGNOSIS — R269 Unspecified abnormalities of gait and mobility: Secondary | ICD-10-CM | POA: Diagnosis not present

## 2013-10-20 DIAGNOSIS — R279 Unspecified lack of coordination: Secondary | ICD-10-CM | POA: Diagnosis not present

## 2013-10-20 DIAGNOSIS — H547 Unspecified visual loss: Secondary | ICD-10-CM | POA: Diagnosis not present

## 2013-10-20 DIAGNOSIS — M6281 Muscle weakness (generalized): Secondary | ICD-10-CM | POA: Diagnosis not present

## 2013-10-20 DIAGNOSIS — F0390 Unspecified dementia without behavioral disturbance: Secondary | ICD-10-CM | POA: Diagnosis not present

## 2013-10-20 DIAGNOSIS — K922 Gastrointestinal hemorrhage, unspecified: Secondary | ICD-10-CM | POA: Diagnosis not present

## 2013-10-21 DIAGNOSIS — H547 Unspecified visual loss: Secondary | ICD-10-CM | POA: Diagnosis not present

## 2013-10-21 DIAGNOSIS — M6281 Muscle weakness (generalized): Secondary | ICD-10-CM | POA: Diagnosis not present

## 2013-10-21 DIAGNOSIS — R269 Unspecified abnormalities of gait and mobility: Secondary | ICD-10-CM | POA: Diagnosis not present

## 2013-10-21 DIAGNOSIS — K922 Gastrointestinal hemorrhage, unspecified: Secondary | ICD-10-CM | POA: Diagnosis not present

## 2013-10-21 DIAGNOSIS — R279 Unspecified lack of coordination: Secondary | ICD-10-CM | POA: Diagnosis not present

## 2013-10-21 DIAGNOSIS — F0390 Unspecified dementia without behavioral disturbance: Secondary | ICD-10-CM | POA: Diagnosis not present

## 2013-10-22 DIAGNOSIS — F0390 Unspecified dementia without behavioral disturbance: Secondary | ICD-10-CM | POA: Diagnosis not present

## 2013-10-22 DIAGNOSIS — R279 Unspecified lack of coordination: Secondary | ICD-10-CM | POA: Diagnosis not present

## 2013-10-22 DIAGNOSIS — H547 Unspecified visual loss: Secondary | ICD-10-CM | POA: Diagnosis not present

## 2013-10-22 DIAGNOSIS — M6281 Muscle weakness (generalized): Secondary | ICD-10-CM | POA: Diagnosis not present

## 2013-10-22 DIAGNOSIS — K922 Gastrointestinal hemorrhage, unspecified: Secondary | ICD-10-CM | POA: Diagnosis not present

## 2013-10-22 DIAGNOSIS — R269 Unspecified abnormalities of gait and mobility: Secondary | ICD-10-CM | POA: Diagnosis not present

## 2013-10-24 DIAGNOSIS — H547 Unspecified visual loss: Secondary | ICD-10-CM | POA: Diagnosis not present

## 2013-10-24 DIAGNOSIS — K922 Gastrointestinal hemorrhage, unspecified: Secondary | ICD-10-CM | POA: Diagnosis not present

## 2013-10-24 DIAGNOSIS — M6281 Muscle weakness (generalized): Secondary | ICD-10-CM | POA: Diagnosis not present

## 2013-10-24 DIAGNOSIS — R269 Unspecified abnormalities of gait and mobility: Secondary | ICD-10-CM | POA: Diagnosis not present

## 2013-10-24 DIAGNOSIS — R279 Unspecified lack of coordination: Secondary | ICD-10-CM | POA: Diagnosis not present

## 2013-10-24 DIAGNOSIS — F0390 Unspecified dementia without behavioral disturbance: Secondary | ICD-10-CM | POA: Diagnosis not present

## 2013-10-26 DIAGNOSIS — H547 Unspecified visual loss: Secondary | ICD-10-CM | POA: Diagnosis not present

## 2013-10-26 DIAGNOSIS — M6281 Muscle weakness (generalized): Secondary | ICD-10-CM | POA: Diagnosis not present

## 2013-10-26 DIAGNOSIS — R279 Unspecified lack of coordination: Secondary | ICD-10-CM | POA: Diagnosis not present

## 2013-10-26 DIAGNOSIS — K922 Gastrointestinal hemorrhage, unspecified: Secondary | ICD-10-CM | POA: Diagnosis not present

## 2013-10-26 DIAGNOSIS — R269 Unspecified abnormalities of gait and mobility: Secondary | ICD-10-CM | POA: Diagnosis not present

## 2013-10-26 DIAGNOSIS — F0390 Unspecified dementia without behavioral disturbance: Secondary | ICD-10-CM | POA: Diagnosis not present

## 2013-10-27 DIAGNOSIS — M6281 Muscle weakness (generalized): Secondary | ICD-10-CM | POA: Diagnosis not present

## 2013-10-27 DIAGNOSIS — R269 Unspecified abnormalities of gait and mobility: Secondary | ICD-10-CM | POA: Diagnosis not present

## 2013-10-27 DIAGNOSIS — H547 Unspecified visual loss: Secondary | ICD-10-CM | POA: Diagnosis not present

## 2013-10-27 DIAGNOSIS — F0390 Unspecified dementia without behavioral disturbance: Secondary | ICD-10-CM | POA: Diagnosis not present

## 2013-10-27 DIAGNOSIS — K922 Gastrointestinal hemorrhage, unspecified: Secondary | ICD-10-CM | POA: Diagnosis not present

## 2013-10-27 DIAGNOSIS — R279 Unspecified lack of coordination: Secondary | ICD-10-CM | POA: Diagnosis not present

## 2013-10-28 DIAGNOSIS — K922 Gastrointestinal hemorrhage, unspecified: Secondary | ICD-10-CM | POA: Diagnosis not present

## 2013-10-28 DIAGNOSIS — R269 Unspecified abnormalities of gait and mobility: Secondary | ICD-10-CM | POA: Diagnosis not present

## 2013-10-28 DIAGNOSIS — R279 Unspecified lack of coordination: Secondary | ICD-10-CM | POA: Diagnosis not present

## 2013-10-28 DIAGNOSIS — H547 Unspecified visual loss: Secondary | ICD-10-CM | POA: Diagnosis not present

## 2013-10-28 DIAGNOSIS — M6281 Muscle weakness (generalized): Secondary | ICD-10-CM | POA: Diagnosis not present

## 2013-10-28 DIAGNOSIS — F0390 Unspecified dementia without behavioral disturbance: Secondary | ICD-10-CM | POA: Diagnosis not present

## 2013-10-29 DIAGNOSIS — F0390 Unspecified dementia without behavioral disturbance: Secondary | ICD-10-CM | POA: Diagnosis not present

## 2013-10-29 DIAGNOSIS — R269 Unspecified abnormalities of gait and mobility: Secondary | ICD-10-CM | POA: Diagnosis not present

## 2013-10-29 DIAGNOSIS — M6281 Muscle weakness (generalized): Secondary | ICD-10-CM | POA: Diagnosis not present

## 2013-10-29 DIAGNOSIS — I10 Essential (primary) hypertension: Secondary | ICD-10-CM | POA: Diagnosis not present

## 2013-10-29 DIAGNOSIS — H547 Unspecified visual loss: Secondary | ICD-10-CM | POA: Diagnosis not present

## 2013-10-29 DIAGNOSIS — R279 Unspecified lack of coordination: Secondary | ICD-10-CM | POA: Diagnosis not present

## 2013-10-29 DIAGNOSIS — K922 Gastrointestinal hemorrhage, unspecified: Secondary | ICD-10-CM | POA: Diagnosis not present

## 2013-10-29 DIAGNOSIS — J309 Allergic rhinitis, unspecified: Secondary | ICD-10-CM | POA: Diagnosis not present

## 2013-12-26 DIAGNOSIS — H43819 Vitreous degeneration, unspecified eye: Secondary | ICD-10-CM | POA: Diagnosis not present

## 2013-12-26 DIAGNOSIS — H35059 Retinal neovascularization, unspecified, unspecified eye: Secondary | ICD-10-CM | POA: Diagnosis not present

## 2013-12-26 DIAGNOSIS — H353290 Exudative age-related macular degeneration, unspecified eye, stage unspecified: Secondary | ICD-10-CM | POA: Diagnosis not present

## 2013-12-26 DIAGNOSIS — H4310 Vitreous hemorrhage, unspecified eye: Secondary | ICD-10-CM | POA: Diagnosis not present

## 2014-02-25 DIAGNOSIS — L219 Seborrheic dermatitis, unspecified: Secondary | ICD-10-CM | POA: Diagnosis not present

## 2014-02-25 DIAGNOSIS — Z85828 Personal history of other malignant neoplasm of skin: Secondary | ICD-10-CM | POA: Diagnosis not present

## 2014-02-25 DIAGNOSIS — L821 Other seborrheic keratosis: Secondary | ICD-10-CM | POA: Diagnosis not present

## 2014-02-25 DIAGNOSIS — D239 Other benign neoplasm of skin, unspecified: Secondary | ICD-10-CM | POA: Diagnosis not present

## 2014-02-25 DIAGNOSIS — L84 Corns and callosities: Secondary | ICD-10-CM | POA: Diagnosis not present

## 2014-03-03 DIAGNOSIS — Z23 Encounter for immunization: Secondary | ICD-10-CM | POA: Diagnosis not present

## 2014-03-03 DIAGNOSIS — I10 Essential (primary) hypertension: Secondary | ICD-10-CM | POA: Diagnosis not present

## 2014-03-03 DIAGNOSIS — M25519 Pain in unspecified shoulder: Secondary | ICD-10-CM | POA: Diagnosis not present

## 2014-03-03 DIAGNOSIS — Z79899 Other long term (current) drug therapy: Secondary | ICD-10-CM | POA: Diagnosis not present

## 2014-03-12 ENCOUNTER — Ambulatory Visit (INDEPENDENT_AMBULATORY_CARE_PROVIDER_SITE_OTHER): Payer: Medicare Other | Admitting: Podiatrist

## 2014-03-12 ENCOUNTER — Encounter: Payer: Self-pay | Admitting: Podiatrist

## 2014-03-12 VITALS — Resp 14 | Ht 65.0 in | Wt 97.0 lb

## 2014-03-12 DIAGNOSIS — M79676 Pain in unspecified toe(s): Principal | ICD-10-CM

## 2014-03-12 DIAGNOSIS — L84 Corns and callosities: Secondary | ICD-10-CM | POA: Diagnosis not present

## 2014-03-12 DIAGNOSIS — M79609 Pain in unspecified limb: Secondary | ICD-10-CM

## 2014-03-12 DIAGNOSIS — B351 Tinea unguium: Secondary | ICD-10-CM

## 2014-03-12 NOTE — Progress Notes (Signed)
   Subjective:    Patient ID: [name], female    DOB: [birth year], 80 y.o.   MRN: [id number]  HPI Comments: Pt presents for debridement of painful left 1st MPJ and 10 toenails.     Review of Systems  Constitutional: Positive for unexpected weight change.  Genitourinary: Positive for urgency.  Musculoskeletal: Positive for gait problem.  Hematological: Bruises/bleeds easily.       Slow to heal.  Psychiatric/Behavioral: Positive for confusion.  All other systems reviewed and are negative.      Objective:   Physical Exam Patient is awake, alert, and oriented x 3.  In no acute distress.  Vascular status is intact with palpable pedal pulses at 1/4 DP and PT bilateral and capillary refill time within normal limits. Neurological sensation is also intact bilaterally via Semmes Weinstein monofilament at 5/5 sites. Light touch, vibratory sensation, Achilles tendon reflex is intact. Dermatological exam reveals skin color, turger and texture as normal. Hyperkeratotic lesion is present left hallux. Patient's toenails are thickened, discolored, dystrophic and clinically mycotic x10.  Musculature intact with dorsiflexion, plantarflexion, inversion, eversion.     Assessment & Plan:  Symptomatic mycotic toenails, callus x1  Plan: Debrided the nails and callus without complication she'll be seen back as needed for followup. If any problems or concerns arise in the meantime she will call.

## 2014-03-12 NOTE — Patient Instructions (Signed)
Corns and Calluses Corns are small areas of thickened skin that usually occur on the top, sides, or tip of a toe. They contain a cone-shaped core with a point that can press on a nerve below. This causes pain. Calluses are areas of thickened skin that usually develop on hands, fingers, palms, soles of the feet, and heels. These are areas that experience frequent friction or pressure. CAUSES  Corns are usually the result of rubbing (friction) or pressure from shoes that are too tight or do not fit properly. Calluses are caused by repeated friction and pressure on the affected areas. SYMPTOMS  A hard growth on the skin.  Pain or tenderness under the skin.  Sometimes, redness and swelling.  Increased discomfort while wearing tight-fitting shoes. DIAGNOSIS  Your caregiver can usually tell what the problem is by doing a physical exam. TREATMENT  Removing the cause of the friction or pressure is usually the only treatment needed. However, sometimes medicines can be used to help soften the hardened, thickened areas. These medicines include salicylic acid plasters and 12% ammonium lactate lotion. These medicines should only be used under the direction of your caregiver. HOME CARE INSTRUCTIONS   Try to remove pressure from the affected area.  You may wear donut-shaped corn pads to protect your skin.  You may use a pumice stone or nonmetallic nail file to gently reduce the thickness of a corn.  Wear properly fitted footwear.  If you have calluses on the hands, wear gloves during activities that cause friction.  If you have diabetes, you should regularly examine your feet. Tell your caregiver if you notice any problems with your feet. SEEK IMMEDIATE MEDICAL CARE IF:   You have increased pain, swelling, redness, or warmth in the affected area.  Your corn or callus starts to drain fluid or bleeds.  You are not getting better, even with treatment. Document Released: 05/20/2004 Document  Revised: 11/06/2011 Document Reviewed: 04/11/2011 ExitCare Patient Information 2015 [employer]. This information is not intended to replace advice given to you by your health care provider. Make sure you discuss any questions you have with your health care provider.  

## 2014-03-20 ENCOUNTER — Ambulatory Visit: Payer: Medicare Other | Admitting: Podiatrist

## 2014-06-03 DIAGNOSIS — Z23 Encounter for immunization: Secondary | ICD-10-CM | POA: Diagnosis not present

## 2014-06-23 DIAGNOSIS — D0462 Carcinoma in situ of skin of left upper limb, including shoulder: Secondary | ICD-10-CM | POA: Diagnosis not present

## 2014-06-23 DIAGNOSIS — D485 Neoplasm of uncertain behavior of skin: Secondary | ICD-10-CM | POA: Diagnosis not present

## 2014-06-23 DIAGNOSIS — L218 Other seborrheic dermatitis: Secondary | ICD-10-CM | POA: Diagnosis not present

## 2014-06-23 DIAGNOSIS — Z85828 Personal history of other malignant neoplasm of skin: Secondary | ICD-10-CM | POA: Diagnosis not present

## 2014-06-23 DIAGNOSIS — L570 Actinic keratosis: Secondary | ICD-10-CM | POA: Diagnosis not present

## 2014-06-23 DIAGNOSIS — C44629 Squamous cell carcinoma of skin of left upper limb, including shoulder: Secondary | ICD-10-CM | POA: Diagnosis not present

## 2014-07-01 DIAGNOSIS — H10011 Acute follicular conjunctivitis, right eye: Secondary | ICD-10-CM | POA: Diagnosis not present

## 2014-07-10 DIAGNOSIS — H3532 Exudative age-related macular degeneration: Secondary | ICD-10-CM | POA: Diagnosis not present

## 2014-07-10 DIAGNOSIS — H10011 Acute follicular conjunctivitis, right eye: Secondary | ICD-10-CM | POA: Diagnosis not present

## 2014-08-03 DIAGNOSIS — R079 Chest pain, unspecified: Secondary | ICD-10-CM | POA: Diagnosis not present

## 2014-08-04 ENCOUNTER — Encounter: Payer: Self-pay | Admitting: Adult Health

## 2014-08-04 ENCOUNTER — Non-Acute Institutional Stay (SKILLED_NURSING_FACILITY): Payer: Medicare Other | Admitting: Adult Health

## 2014-08-04 DIAGNOSIS — R4182 Altered mental status, unspecified: Secondary | ICD-10-CM | POA: Diagnosis not present

## 2014-08-04 DIAGNOSIS — R0781 Pleurodynia: Secondary | ICD-10-CM | POA: Diagnosis not present

## 2014-08-04 DIAGNOSIS — N182 Chronic kidney disease, stage 2 (mild): Secondary | ICD-10-CM | POA: Diagnosis not present

## 2014-08-04 DIAGNOSIS — J189 Pneumonia, unspecified organism: Secondary | ICD-10-CM | POA: Diagnosis not present

## 2014-08-04 NOTE — Progress Notes (Signed)
Patient ID: [name], female   DOB: [birth year], 80 y.o.   MRN: [id number]  Nursing Home Location:  [facility]   Code Status: DNR    Place of Service: SNF [phone]  Chief Complaint  Patient presents with  . Acute Visit    right rib pain    HPI:  80 y.o.  Female residing at [facility], skilled section. I was asked to see her today for rib pain on the right that worsens with a deep breath. She denies CP, SOB or cough. She is very uncomfortable and describes he pain at a 10/10.  She has no had a fever or decreased appetite.  Tylenol has not helped.  Review of Systems:  Review of Systems  Constitutional: Negative for fever, chills, diaphoresis, activity change, appetite change and fatigue.  HENT: Negative for congestion, postnasal drip, rhinorrhea, sinus pressure and trouble swallowing.   Respiratory: Positive for shortness of breath. Negative for cough, wheezing and stridor.        Pain on inspiration  Cardiovascular: Negative for chest pain, palpitations and leg swelling.  Gastrointestinal: Negative for abdominal pain and abdominal distention.  Genitourinary: Negative for dysuria, flank pain and difficulty urinating.  Neurological: Negative for seizures, speech difficulty and light-headedness.  Psychiatric/Behavioral: Positive for confusion. Negative for behavioral problems and agitation.    Medications: Patient's Medications  New Prescriptions   No medications on file  Previous Medications   ACETAMINOPHEN (TYLENOL) 500 MG TABLET    Take 1,000 mg by mouth at bedtime.    AMLODIPINE (NORVASC) 2.5 MG TABLET    Take 2.5 mg by mouth daily.   BETA CAROTENE W/MINERALS (OCUVITE) TABLET    Take 1 tablet by mouth 2 (two) times daily.   CALCIUM CARB-CHOLECALCIFEROL (CALCIUM 600 + D PO)    Take 1 tablet by mouth 2 (two) times daily.   CETIRIZINE (ZYRTEC) 10 MG TABLET    Take 10 mg by mouth every morning.    FEEDING SUPPLEMENT, ENSURE  COMPLETE, (ENSURE COMPLETE) LIQD    Take 237 mLs by mouth 3 (three) times daily between meals.   FLUTICASONE (VERAMYST) 27.5 MCG/SPRAY NASAL SPRAY    Place 1 spray into the nose daily.   MOEXIPRIL (UNIVASC) 15 MG TABLET    Take 15 mg by mouth every evening.   OMEPRAZOLE (PRILOSEC) 20 MG CAPSULE    Take 20 mg by mouth daily with lunch.    POLYETHYLENE GLYCOL (MIRALAX / GLYCOLAX) PACKET    Take 17 g by mouth every evening.    VITAMIN B-12 (CYANOCOBALAMIN) 1000 MCG TABLET    Take 1,000 mcg by mouth daily.    ZOLPIDEM (AMBIEN) 10 MG TABLET    Take 10 mg by mouth at bedtime as needed for sleep.   Modified Medications   No medications on file  Discontinued Medications   No medications on file     Physical Exam:  Filed Vitals:   08/04/14 1159  BP: 126/67  Pulse: 107  Temp: 97.9 F (36.6 C)  Resp: 19  SpO2: 91%    Physical Exam  Constitutional: No distress.  frail  Cardiovascular: Normal rate, regular rhythm and normal heart sounds.   No murmur heard. Pulmonary/Chest:  Crackles in the bases, decreased throughout. Mildly distressed due to pain. No bruising or deformity to the area  Abdominal: Soft. Bowel sounds are normal. She exhibits no distension. There is no tenderness.  No CVA tenderness  Neurological: She is alert. No cranial nerve  deficit.  Oriented x2  Skin: Skin is warm and dry. No rash noted. She is not diaphoretic. No erythema.  Psychiatric: Affect normal.    Labs reviewed/Significant Diagnostic Results:  08/03/14: PCXR: abnormal opacity in the right lung base, possibly pneumonia, but with a dominant round appearance density of concern for an actual mass lesion here until proven othewise. No pneumothorax  Basic Metabolic Panel:  Recent Labs  10/08/13 1510 10/09/13 0502 10/10/13 0402  NA 137 140 140  K 3.8 3.4* 4.1  CL 95* 101 103  CO2 32 27 25  GLUCOSE 109* 101* 100*  BUN 22 15 15   CREATININE 0.78 0.86 0.79  CALCIUM 10.1 9.3 9.7   Liver Function  Tests: No results for input(s): AST, ALT, ALKPHOS, BILITOT, PROT, ALBUMIN in the last 8760 hours. No results for input(s): LIPASE, AMYLASE in the last 8760 hours. No results for input(s): AMMONIA in the last 8760 hours. CBC:  Recent Labs  10/09/13 0502 10/09/13 1845 10/10/13 0402 10/11/13 0527  WBC 6.0  --  6.2 5.3  HGB 11.1* 12.1 11.0* 11.2*  HCT 35.2* 38.0 34.7* 34.8*  MCV 91.2  --  91.1 90.4  PLT 299  --  306 329      Assessment/Plan  Rib pain She is having fairly significant pain, associated with taking a deep breath on the right side below her breast. A chest xray was ordered showing possible pneumonia vs.an mass. This was a fairly acute onset so we will tx for pna and f/u xray depending on her goals of care.  Levaquin 500 mg daily for 7 days. Continue ultram and tylenol for pain. Monitor VS qshift .  HCAP (healthcare-associated pneumonia) See above plan     Cindi Carbon, [place] [phone]

## 2014-08-04 NOTE — Assessment & Plan Note (Addendum)
She is having fairly significant pain, associated with taking a deep breath on the right side below her breast. A chest xray was ordered showing possible pneumonia vs.an mass. This was a fairly acute onset so we will tx for pna and f/u xray depending on her goals of care.  Levaquin 500 mg daily for 7 days. Continue ultram and tylenol for pain. Monitor VS qshift .

## 2014-08-04 NOTE — Assessment & Plan Note (Signed)
See above plan. 

## 2014-08-06 ENCOUNTER — Non-Acute Institutional Stay (SKILLED_NURSING_FACILITY): Payer: Medicare Other | Admitting: Adult Health

## 2014-08-06 ENCOUNTER — Other Ambulatory Visit: Payer: Self-pay | Admitting: Urology

## 2014-08-06 ENCOUNTER — Encounter: Payer: Self-pay | Admitting: Adult Health

## 2014-08-06 ENCOUNTER — Emergency Department (HOSPITAL_COMMUNITY): Payer: Medicare Other

## 2014-08-06 ENCOUNTER — Inpatient Hospital Stay (HOSPITAL_COMMUNITY)
Admission: EM | Admit: 2014-08-06 | Discharge: 2014-08-12 | DRG: 193 | Disposition: A | Discharge status: Skilled Nursing Facility | Payer: Medicare Other | Attending: Internal Medicine | Admitting: Internal Medicine

## 2014-08-06 ENCOUNTER — Encounter (HOSPITAL_COMMUNITY): Payer: Self-pay | Admitting: *Deleted

## 2014-08-06 ENCOUNTER — Other Ambulatory Visit: Payer: Self-pay | Admitting: Internal Medicine

## 2014-08-06 DIAGNOSIS — E43 Unspecified severe protein-calorie malnutrition: Secondary | ICD-10-CM | POA: Diagnosis present

## 2014-08-06 DIAGNOSIS — R079 Chest pain, unspecified: Secondary | ICD-10-CM | POA: Diagnosis not present

## 2014-08-06 DIAGNOSIS — R0602 Shortness of breath: Secondary | ICD-10-CM

## 2014-08-06 DIAGNOSIS — Z885 Allergy status to narcotic agent status: Secondary | ICD-10-CM

## 2014-08-06 DIAGNOSIS — I10 Essential (primary) hypertension: Secondary | ICD-10-CM | POA: Diagnosis present

## 2014-08-06 DIAGNOSIS — J189 Pneumonia, unspecified organism: Secondary | ICD-10-CM | POA: Diagnosis present

## 2014-08-06 DIAGNOSIS — Z882 Allergy status to sulfonamides status: Secondary | ICD-10-CM | POA: Diagnosis not present

## 2014-08-06 DIAGNOSIS — E46 Unspecified protein-calorie malnutrition: Secondary | ICD-10-CM

## 2014-08-06 DIAGNOSIS — J90 Pleural effusion, not elsewhere classified: Secondary | ICD-10-CM | POA: Diagnosis present

## 2014-08-06 DIAGNOSIS — H3530 Unspecified macular degeneration: Secondary | ICD-10-CM | POA: Diagnosis present

## 2014-08-06 DIAGNOSIS — Z9889 Other specified postprocedural states: Secondary | ICD-10-CM | POA: Diagnosis not present

## 2014-08-06 DIAGNOSIS — R0781 Pleurodynia: Secondary | ICD-10-CM

## 2014-08-06 DIAGNOSIS — R0902 Hypoxemia: Secondary | ICD-10-CM | POA: Diagnosis present

## 2014-08-06 DIAGNOSIS — R091 Pleurisy: Secondary | ICD-10-CM | POA: Diagnosis not present

## 2014-08-06 DIAGNOSIS — Z7901 Long term (current) use of anticoagulants: Secondary | ICD-10-CM

## 2014-08-06 DIAGNOSIS — F0390 Unspecified dementia without behavioral disturbance: Secondary | ICD-10-CM | POA: Diagnosis not present

## 2014-08-06 DIAGNOSIS — M19019 Primary osteoarthritis, unspecified shoulder: Secondary | ICD-10-CM | POA: Diagnosis present

## 2014-08-06 DIAGNOSIS — Z681 Body mass index (BMI) 19 or less, adult: Secondary | ICD-10-CM | POA: Diagnosis not present

## 2014-08-06 DIAGNOSIS — R918 Other nonspecific abnormal finding of lung field: Secondary | ICD-10-CM | POA: Diagnosis not present

## 2014-08-06 DIAGNOSIS — R911 Solitary pulmonary nodule: Secondary | ICD-10-CM | POA: Diagnosis present

## 2014-08-06 DIAGNOSIS — K210 Gastro-esophageal reflux disease with esophagitis: Secondary | ICD-10-CM | POA: Diagnosis not present

## 2014-08-06 DIAGNOSIS — K219 Gastro-esophageal reflux disease without esophagitis: Secondary | ICD-10-CM | POA: Diagnosis present

## 2014-08-06 DIAGNOSIS — Z87891 Personal history of nicotine dependence: Secondary | ICD-10-CM | POA: Diagnosis not present

## 2014-08-06 DIAGNOSIS — Y95 Nosocomial condition: Secondary | ICD-10-CM | POA: Diagnosis present

## 2014-08-06 DIAGNOSIS — Z66 Do not resuscitate: Secondary | ICD-10-CM | POA: Diagnosis present

## 2014-08-06 DIAGNOSIS — M1990 Unspecified osteoarthritis, unspecified site: Secondary | ICD-10-CM | POA: Insufficient documentation

## 2014-08-06 DIAGNOSIS — J918 Pleural effusion in other conditions classified elsewhere: Secondary | ICD-10-CM | POA: Diagnosis not present

## 2014-08-06 DIAGNOSIS — J9811 Atelectasis: Secondary | ICD-10-CM | POA: Diagnosis not present

## 2014-08-06 HISTORY — DX: Pleural effusion, not elsewhere classified: J90

## 2014-08-06 LAB — URINALYSIS, ROUTINE W REFLEX MICROSCOPIC
BILIRUBIN URINE: NEGATIVE
GLUCOSE, UA: NEGATIVE mg/dL
HGB URINE DIPSTICK: NEGATIVE
Ketones, ur: NEGATIVE mg/dL
Nitrite: NEGATIVE
Protein, ur: 30 mg/dL — AB
SPECIFIC GRAVITY, URINE: 1.016 (ref 1.005–1.030)
Urobilinogen, UA: 0.2 mg/dL (ref 0.0–1.0)
pH: 5.5 (ref 5.0–8.0)

## 2014-08-06 LAB — COMPREHENSIVE METABOLIC PANEL
ALK PHOS: 163 U/L — AB (ref 39–117)
ALT: 12 U/L (ref 0–35)
AST: 14 U/L (ref 0–37)
Albumin: 2.2 g/dL — ABNORMAL LOW (ref 3.5–5.2)
Anion gap: 17 — ABNORMAL HIGH (ref 5–15)
BILIRUBIN TOTAL: 0.3 mg/dL (ref 0.3–1.2)
BUN: 34 mg/dL — AB (ref 6–23)
CHLORIDE: 89 meq/L — AB (ref 96–112)
CO2: 22 mEq/L (ref 19–32)
Calcium: 9.4 mg/dL (ref 8.4–10.5)
Creatinine, Ser: 0.87 mg/dL (ref 0.50–1.10)
GFR calc Af Amer: 64 mL/min — ABNORMAL LOW (ref >90)
GFR calc non Af Amer: 55 mL/min — ABNORMAL LOW (ref >90)
Glucose, Bld: 112 mg/dL — ABNORMAL HIGH (ref 70–99)
POTASSIUM: 4.1 meq/L (ref 3.7–5.3)
SODIUM: 128 meq/L — AB (ref 137–147)
TOTAL PROTEIN: 6.5 g/dL (ref 6.0–8.3)

## 2014-08-06 LAB — I-STAT CG4 LACTIC ACID, ED
Lactic Acid, Venous: 1.46 mmol/L (ref 0.5–2.2)
Lactic Acid, Venous: 1.09 mmol/L (ref 0.5–2.2)

## 2014-08-06 LAB — TROPONIN I: Troponin I: <0.3 ng/mL (ref <0.30)

## 2014-08-06 LAB — CBC WITH DIFFERENTIAL/PLATELET
Basophils Absolute: 0 10*3/uL (ref 0.0–0.1)
Basophils Relative: 0 % (ref 0–1)
EOS ABS: 0 10*3/uL (ref 0.0–0.7)
Eosinophils Relative: 0 % (ref 0–5)
HCT: 38.8 % (ref 36.0–46.0)
HEMOGLOBIN: 13.3 g/dL (ref 12.0–15.0)
Lymphocytes Relative: 11 % — ABNORMAL LOW (ref 12–46)
Lymphs Abs: 1.9 10*3/uL (ref 0.7–4.0)
MCH: 30.7 pg (ref 26.0–34.0)
MCHC: 34.3 g/dL (ref 30.0–36.0)
MCV: 89.6 fL (ref 78.0–100.0)
MONOS PCT: 7 % (ref 3–12)
Monocytes Absolute: 1.1 10*3/uL — ABNORMAL HIGH (ref 0.1–1.0)
NEUTROS ABS: 14.4 10*3/uL — AB (ref 1.7–7.7)
NEUTROS PCT: 82 % — AB (ref 43–77)
PLATELETS: 482 10*3/uL — AB (ref 150–400)
RBC: 4.33 MIL/uL (ref 3.87–5.11)
RDW: 13.4 % (ref 11.5–15.5)
WBC: 17.4 10*3/uL — ABNORMAL HIGH (ref 4.0–10.5)

## 2014-08-06 LAB — URINE MICROSCOPIC-ADD ON

## 2014-08-06 LAB — PROTIME-INR
INR: 1 (ref 0.00–1.49)
Prothrombin Time: 13.3 seconds (ref 11.6–15.2)

## 2014-08-06 MED ORDER — NYSTATIN 100000 UNIT/ML MT SUSP
5.0000 mL | Freq: Four times a day (QID) | OROMUCOSAL | Status: DC
  Administered 2014-08-06 – 2014-08-12 (×22): 500000 [IU] via OROMUCOSAL
  Filled 2014-08-06 (×25): qty 5

## 2014-08-06 MED ORDER — ENSURE COMPLETE PO LIQD
237.0000 mL | Freq: Three times a day (TID) | ORAL | Status: DC
  Administered 2014-08-07 – 2014-08-12 (×11): 237 mL via ORAL

## 2014-08-06 MED ORDER — SODIUM CHLORIDE 0.9 % IV SOLN
INTRAVENOUS | Status: DC
  Administered 2014-08-06: 23:00:00 via INTRAVENOUS

## 2014-08-06 MED ORDER — ZOLPIDEM TARTRATE 5 MG PO TABS
5.0000 mg | ORAL_TABLET | Freq: Every evening | ORAL | Status: DC | PRN
  Administered 2014-08-06 – 2014-08-11 (×5): 5 mg via ORAL
  Filled 2014-08-06 (×5): qty 1

## 2014-08-06 MED ORDER — CALCIUM CARBONATE-VITAMIN D 500-200 MG-UNIT PO TABS
1.0000 | ORAL_TABLET | Freq: Two times a day (BID) | ORAL | Status: DC
  Administered 2014-08-07 – 2014-08-12 (×11): 1 via ORAL
  Filled 2014-08-06 (×14): qty 1

## 2014-08-06 MED ORDER — PANTOPRAZOLE SODIUM 40 MG PO TBEC
40.0000 mg | DELAYED_RELEASE_TABLET | Freq: Every day | ORAL | Status: DC
  Administered 2014-08-07 – 2014-08-12 (×6): 40 mg via ORAL
  Filled 2014-08-06 (×6): qty 1

## 2014-08-06 MED ORDER — HYDROCORTISONE 2.5 % RE CREA: 1.0000 "application " | TOPICAL_CREAM | RECTAL | Status: DC | PRN

## 2014-08-06 MED ORDER — VANCOMYCIN HCL IN DEXTROSE 1-5 GM/200ML-% IV SOLN
1000.0000 mg | Freq: Once | INTRAVENOUS | Status: AC
  Administered 2014-08-06: 1000 mg via INTRAVENOUS
  Filled 2014-08-06: qty 200

## 2014-08-06 MED ORDER — SODIUM CHLORIDE 0.9 % IV BOLUS (SEPSIS)
1000.0000 mL | Freq: Once | INTRAVENOUS | Status: AC
  Administered 2014-08-06: 1000 mL via INTRAVENOUS

## 2014-08-06 MED ORDER — LORATADINE 10 MG PO TABS
10.0000 mg | ORAL_TABLET | Freq: Every day | ORAL | Status: DC
  Administered 2014-08-07 – 2014-08-12 (×6): 10 mg via ORAL
  Filled 2014-08-06 (×6): qty 1

## 2014-08-06 MED ORDER — FLUTICASONE FUROATE 27.5 MCG/SPRAY NA SUSP: 1.0000 | Freq: Every day | NASAL | Status: DC

## 2014-08-06 MED ORDER — DEXTROSE 5 % IV SOLN
2.0000 g | Freq: Once | INTRAVENOUS | Status: AC
  Administered 2014-08-06: 2 g via INTRAVENOUS
  Filled 2014-08-06: qty 2

## 2014-08-06 MED ORDER — VANCOMYCIN HCL IN DEXTROSE 750-5 MG/150ML-% IV SOLN
750.0000 mg | INTRAVENOUS | Status: DC
  Administered 2014-08-07 – 2014-08-08 (×2): 750 mg via INTRAVENOUS
  Filled 2014-08-06 (×3): qty 150

## 2014-08-06 MED ORDER — CEFEPIME HCL 1 G IJ SOLR: 1.0000 g | Freq: Three times a day (TID) | INTRAMUSCULAR | Status: DC

## 2014-08-06 MED ORDER — OCUVITE PO TABS
1.0000 | ORAL_TABLET | Freq: Two times a day (BID) | ORAL | Status: DC
  Administered 2014-08-06 – 2014-08-12 (×12): 1 via ORAL
  Filled 2014-08-06 (×14): qty 1

## 2014-08-06 MED ORDER — CALCIUM CARBONATE-VITAMIN D 600-400 MG-UNIT PO TABS: 1.0000 | ORAL_TABLET | Freq: Every day | ORAL | Status: DC

## 2014-08-06 MED ORDER — VITAMIN B-12 1000 MCG PO TABS
1000.0000 ug | ORAL_TABLET | Freq: Every day | ORAL | Status: DC
  Administered 2014-08-07 – 2014-08-12 (×6): 1000 ug via ORAL
  Filled 2014-08-06 (×6): qty 1

## 2014-08-06 MED ORDER — POLYETHYLENE GLYCOL 3350 17 G PO PACK
17.0000 g | PACK | Freq: Every evening | ORAL | Status: DC
  Administered 2014-08-06 – 2014-08-11 (×6): 17 g via ORAL
  Filled 2014-08-06 (×7): qty 1

## 2014-08-06 MED ORDER — DEXTROSE 5 % IV SOLN
1.0000 g | INTRAVENOUS | Status: DC
  Administered 2014-08-07 – 2014-08-11 (×5): 1 g via INTRAVENOUS
  Filled 2014-08-06 (×6): qty 1

## 2014-08-06 MED ORDER — LORATADINE 10 MG PO TABS
10.0000 mg | ORAL_TABLET | Freq: Every day | ORAL | Status: DC
  Filled 2014-08-06: qty 1

## 2014-08-06 MED ORDER — HEPARIN SODIUM (PORCINE) 5000 UNIT/ML IJ SOLN
5000.0000 [IU] | Freq: Three times a day (TID) | INTRAMUSCULAR | Status: DC
  Administered 2014-08-06 – 2014-08-12 (×17): 5000 [IU] via SUBCUTANEOUS
  Filled 2014-08-06 (×20): qty 1

## 2014-08-06 MED ORDER — FLUTICASONE PROPIONATE 50 MCG/ACT NA SUSP
1.0000 | Freq: Every day | NASAL | Status: DC
  Administered 2014-08-07 – 2014-08-12 (×6): 1 via NASAL
  Filled 2014-08-06: qty 16

## 2014-08-06 MED ORDER — ACETAMINOPHEN 500 MG PO TABS
1000.0000 mg | ORAL_TABLET | Freq: Three times a day (TID) | ORAL | Status: DC
  Administered 2014-08-06 – 2014-08-12 (×17): 1000 mg via ORAL
  Filled 2014-08-06 (×26): qty 2

## 2014-08-06 MED ORDER — AMLODIPINE BESYLATE 2.5 MG PO TABS
2.5000 mg | ORAL_TABLET | Freq: Every day | ORAL | Status: DC
  Administered 2014-08-07 – 2014-08-12 (×6): 2.5 mg via ORAL
  Filled 2014-08-06 (×6): qty 1

## 2014-08-06 MED ORDER — SODIUM CHLORIDE 0.9 % IV BOLUS (SEPSIS)
500.0000 mL | INTRAVENOUS | Status: AC
  Administered 2014-08-06: 500 mL via INTRAVENOUS

## 2014-08-06 NOTE — Progress Notes (Signed)
ANTIBIOTIC CONSULT NOTE - INITIAL  Pharmacy Consult for vancomycin/Cefepime Indication: rule out pneumonia  Allergies  Allergen Reactions  . Celebrex [Celecoxib] Itching  . Morphine And Related Nausea Only  . Vicodin [Hydrocodone-Acetaminophen] Itching  . Sulfa Antibiotics Rash    Patient Measurements:   Adjusted Body Weight:   Vital Signs: Temp: 98.2 F (36.8 C) (12/10 1657) Temp Source: Oral (12/10 1657) BP: 139/70 mmHg (12/10 1657) Pulse Rate: 114 (12/10 1657) Intake/Output from previous day:   Intake/Output from this shift:    Labs: No results for input(s): WBC, HGB, PLT, LABCREA, CREATININE in the last 72 hours. CrCl cannot be calculated (Unknown ideal weight.). No results for input(s): VANCOTROUGH, VANCOPEAK, VANCORANDOM, GENTTROUGH, GENTPEAK, GENTRANDOM, TOBRATROUGH, TOBRAPEAK, TOBRARND, AMIKACINPEAK, AMIKACINTROU, AMIKACIN in the last 72 hours.   Microbiology: No results found for this or any previous visit (from the past 720 hour(s)).  Medical History: Past Medical History  Diagnosis Date  . Arthritis   . Carpal tunnel syndrome on left   . DJD (degenerative joint disease)   . Diverticulosis   . Macular degeneration syndrome   . GERD (gastroesophageal reflux disease)   . Headache(784.0)   . Wears glasses   . Wears hearing aid     both  . Wears partial dentures     top and bottom  . High blood pressure     echo 09-normal  . Lower GI bleed 06/2013    required 2 units PRBC   Assessment: 89 YOF presents with shortness of breath, starting broad spectrum antibiotics for HCAP.  She was on levofloxacin at [facility] for pneumonia, sent to ED for concern of PE per NH note (CTA ordered).  12/7 CXR revealed RLL opacity - PNA vs Mass, 12/10 CXR reveals same    12/10 >> vancomycin  >> 12/10 >> cefepime  >>    Tmax: 100.4 WBCs: elevated Renal: SCr WNL, est normalized CrCl = 16ml/min  12/10 blood: 12/10 sputum:   Goal of Therapy:  Vancomycin trough level 15-20  mcg/ml  Plan:   Vancomycin 1gm in ED then 750mg  IV q24h  Check steady state level  Monitor renal function  Cefepime 2gm in ED then 1gm IV q24h  Doreene Eland, PharmD, BCPS.   Pager: [phone]  08/06/2014,5:49 PM

## 2014-08-06 NOTE — ED Notes (Addendum)
Patient arrived from [facility] via [facility] due to ongoing SOB On 12/7 patient c/o right chest wall pain CXR was obtained that showed an abnormal opacity in the RLL, possibly PNA but more likely a mass lesion Patient was placed on Levaquin for possible PNA Today O2 sat on room air 92% which increased to 100% when placed on 2L [place] Patient states that SOB improved after [place] applied Ultram given at 1530 due to c/o continued right side pain, which helped with pain Nursing home staff called EMS due to patient's c/o "Subjective SOB" and continued pain to right side of chest Per EMS, patient also fell at the nursing home "sometime between the 7th and today"--No injury from fall per EMS

## 2014-08-06 NOTE — ED Provider Notes (Signed)
CSN: [id number]     Arrival date & time 08/06/14  1653 History   First MD Initiated Contact with Patient 08/06/14 1714     Chief Complaint  Patient presents with  . Shortness of Breath     (Consider location/radiation/quality/duration/timing/severity/associated sxs/prior Treatment) HPI Comments: Patient is a 80 yo F PMHx significant for hypertension, GERD presenting to the emergency department from [facility] for ongoing shortness of breath that began on December 7. At that point she began complaining about right-sided chest wall pain, had a chest x-ray that was suggestive of pneumonia or mass. She's been taking Levaquin since for possible pneumonia. She was noticed to have decreased oxygen saturations to 92% on room air, shortness of breath improved with nasal cannula. She has been taking Ultram for right-sided chest wall pain that has been helping her symptoms. Patient has had intermittent fevers.    Past Medical History  Diagnosis Date  . Arthritis   . Carpal tunnel syndrome on left   . DJD (degenerative joint disease)   . Diverticulosis   . Macular degeneration syndrome   . GERD (gastroesophageal reflux disease)   . Headache(784.0)   . Wears glasses   . Wears hearing aid     both  . Wears partial dentures     top and bottom  . High blood pressure     echo 09-normal  . Lower GI bleed 06/2013    required 2 units PRBC   Past Surgical History  Procedure Laterality Date  . Appendectomy    . Hip surgery      left  . Knee surgery    . Tonsillectomy    . Thyroid cyst excision    . Eye surgery      cataractas  . Carpal tunnel release  2/12    rt  . Joint replacement  2006    rt total hip x2  . Carpal tunnel release  04/23/2012    Procedure: CARPAL TUNNEL RELEASE;  Surgeon: Wynonia Sours, MD;  Location: [place];  Service: Orthopedics;  Laterality: Left;   History reviewed. No pertinent family history. History  Substance Use Topics  .  Smoking status: Former Smoker    Quit date: 07/07/1973  . Smokeless tobacco: Never Used  . Alcohol Use: Yes   OB History    No data available     Review of Systems  Constitutional: Positive for fever.  Cardiovascular: Positive for chest pain.  All other systems reviewed and are negative.     Allergies  Celebrex; Morphine and related; Vicodin; and Sulfa antibiotics  Home Medications   Prior to Admission medications   Medication Sig Start Date End Date Taking? Authorizing Provider  acetaminophen (TYLENOL) 500 MG tablet Take 1,000 mg by mouth 4 (four) times daily -  with meals and at bedtime.    Yes Historical Provider, MD  amLODipine (NORVASC) 2.5 MG tablet Take 2.5 mg by mouth daily.   Yes Historical Provider, MD  beta carotene w/minerals (OCUVITE) tablet Take 1 tablet by mouth 2 (two) times daily.   Yes Historical Provider, MD  Calcium Carb-Cholecalciferol (CALCIUM 600 + D PO) Take 1 tablet by mouth 2 (two) times daily.   Yes Historical Provider, MD  cetirizine-pseudoephedrine (ZYRTEC-D) 5-120 MG per tablet Take 1 tablet by mouth every morning.   Yes Historical Provider, MD  feeding supplement, ENSURE COMPLETE, (ENSURE COMPLETE) LIQD Take 237 mLs by mouth 3 (three) times daily between meals. 07/04/13  Yes Quillian Quince  Durene Cal, MD  fluticasone (VERAMYST) 27.5 MCG/SPRAY nasal spray Place 1 spray into the nose daily.   Yes Historical Provider, MD  hydrocortisone (ANUSOL-HC) 2.5 % rectal cream Place 1 application rectally as needed for hemorrhoids or itching.   Yes Historical Provider, MD  levofloxacin (LEVAQUIN) 500 MG tablet Take 500 mg by mouth daily. 08/04/14 08/10/14 Yes Historical Provider, MD  moexipril (UNIVASC) 15 MG tablet Take 15 mg by mouth every evening.   Yes Historical Provider, MD  omeprazole (PRILOSEC) 20 MG capsule Take 20 mg by mouth daily with lunch.    Yes Historical Provider, MD  polyethylene glycol (MIRALAX / GLYCOLAX) packet Take 17 g by mouth every evening.    Yes  Historical Provider, MD  traMADol (ULTRAM) 50 MG tablet Take by mouth every 6 (six) hours as needed for moderate pain.    Yes Historical Provider, MD  vitamin B-12 (CYANOCOBALAMIN) 1000 MCG tablet Take 1,000 mcg by mouth daily.    Yes Historical Provider, MD  zolpidem (AMBIEN) 5 MG tablet Take 5 mg by mouth at bedtime.   Yes Historical Provider, MD  cetirizine (ZYRTEC) 10 MG tablet Take 10 mg by mouth every morning.     Historical Provider, MD  zolpidem (AMBIEN) 10 MG tablet Take 10 mg by mouth at bedtime as needed for sleep.     Historical Provider, MD   BP 107/72 mmHg  Pulse 105  Temp(Src) 99.7 F (37.6 C) (Rectal)  Resp 33  Ht 5\' 3"  (1.6 m)  Wt 96 lb (43.545 kg)  BMI 17.01 kg/m2  SpO2 96% Physical Exam  Constitutional: She is oriented to person, place, and time. She appears well-developed and well-nourished. No distress.  HENT:  Head: Normocephalic and atraumatic.  Right Ear: External ear normal.  Left Ear: External ear normal.  Nose: Nose normal.  Mouth/Throat: Uvula is midline and oropharynx is clear and moist. Mucous membranes are dry. No oropharyngeal exudate.  Eyes: Conjunctivae are normal.  Neck: Neck supple.  Cardiovascular: Regular rhythm and normal heart sounds.  Tachycardia present.   Pulmonary/Chest: Tachypnea noted. She has decreased breath sounds in the right lower field.  Abdominal: Soft. There is no tenderness.  Musculoskeletal: She exhibits no edema.  Lymphadenopathy:    She has no cervical adenopathy.  Neurological: She is alert and oriented to person, place, and time.  Skin: Skin is warm and dry. No rash noted. She is not diaphoretic.  Nursing note and vitals reviewed.   ED Course  Procedures (including critical care time) Medications  vancomycin (VANCOCIN) IVPB 750 mg/150 ml premix (not administered)  ceFEPIme (MAXIPIME) 1 g in dextrose 5 % 50 mL IVPB (not administered)  zolpidem (AMBIEN) tablet 5 mg (5 mg Oral Given 08/06/14 2359)  hydrocortisone  (ANUSOL-HC) 2.5 % rectal cream 1 application (not administered)  feeding supplement (ENSURE COMPLETE) (ENSURE COMPLETE) liquid 237 mL (237 mLs Oral Not Given 08/06/14 2230)  beta carotene w/minerals (OCUVITE) tablet 1 tablet (1 tablet Oral Given 08/06/14 2359)  amLODipine (NORVASC) tablet 2.5 mg (not administered)  polyethylene glycol (MIRALAX / GLYCOLAX) packet 17 g (17 g Oral Given 08/06/14 2358)  acetaminophen (TYLENOL) tablet 1,000 mg (1,000 mg Oral Given 08/06/14 2359)  pantoprazole (PROTONIX) EC tablet 40 mg (not administered)  vitamin B-12 (CYANOCOBALAMIN) tablet 1,000 mcg (not administered)  nystatin (MYCOSTATIN) 100000 UNIT/ML suspension 500,000 Units (500,000 Units Mouth/Throat Given 08/06/14 2358)  0.9 %  sodium chloride infusion ( Intravenous New Bag/Given 08/06/14 2324)  heparin injection 5,000 Units (5,000 Units Subcutaneous Given  08/06/14 2359)  loratadine (CLARITIN) tablet 10 mg (not administered)  fluticasone (FLONASE) 50 MCG/ACT nasal spray 1 spray (not administered)  calcium-vitamin D (OSCAL WITH D) 500-200 MG-UNIT per tablet 1 tablet (not administered)  sodium chloride 0.9 % bolus 1,000 mL (0 mLs Intravenous Stopped 08/06/14 1849)    Followed by  sodium chloride 0.9 % bolus 500 mL (0 mLs Intravenous Stopped 08/06/14 1950)  ceFEPIme (MAXIPIME) 2 g in dextrose 5 % 50 mL IVPB (0 g Intravenous Stopped 08/06/14 1925)  vancomycin (VANCOCIN) IVPB 1000 mg/200 mL premix (0 mg Intravenous Stopped 08/06/14 1925)    Labs Review Labs Reviewed  CBC WITH DIFFERENTIAL - Abnormal; Notable for the following:    WBC 17.4 (*)    Platelets 482 (*)    Neutrophils Relative % 82 (*)    Neutro Abs 14.4 (*)    Lymphocytes Relative 11 (*)    Monocytes Absolute 1.1 (*)    All other components within normal limits  COMPREHENSIVE METABOLIC PANEL - Abnormal; Notable for the following:    Sodium 128 (*)    Chloride 89 (*)    Glucose, Bld 112 (*)    BUN 34 (*)    Albumin 2.2 (*)    Alkaline  Phosphatase 163 (*)    GFR calc non Af Amer 55 (*)    GFR calc Af Amer 64 (*)    Anion gap 17 (*)    All other components within normal limits  URINALYSIS, ROUTINE W REFLEX MICROSCOPIC - Abnormal; Notable for the following:    Protein, ur 30 (*)    Leukocytes, UA SMALL (*)    All other components within normal limits  CULTURE, BLOOD (ROUTINE X 2)  CULTURE, BLOOD (ROUTINE X 2)  URINE CULTURE  CULTURE, EXPECTORATED SPUTUM-ASSESSMENT  GRAM STAIN  TROPONIN I  PROTIME-INR  URINE MICROSCOPIC-ADD ON  TROPONIN I  PRO B NATRIURETIC PEPTIDE  LEGIONELLA ANTIGEN, URINE  STREP PNEUMONIAE URINARY ANTIGEN  CBC WITH DIFFERENTIAL  COMPREHENSIVE METABOLIC PANEL  TROPONIN I  TROPONIN I  I-STAT CG4 LACTIC ACID, ED  I-STAT CG4 LACTIC ACID, ED    Imaging Review Ct Chest Wo Contrast  08/06/2014   CLINICAL DATA:  Cough. Shortness of breath. Abnormal chest x-ray. Initial encounter.  EXAM: CT CHEST WITHOUT CONTRAST  TECHNIQUE: Multidetector CT imaging of the chest was performed following the standard protocol without IV contrast.  COMPARISON:  Chest radiograph 08/06/2014.  FINDINGS: Evaluation is mildly degraded by respiratory motion artifact.  Musculoskeletal: No aggressive osseous lesions. Likely chronic T6 compression fracture. Loose body is present in the superior SUBSCAPULARIS recess of the RIGHT shoulder. RIGHT rotator cuff muscular atrophy compatible with chronic tear. Severe RIGHT glenohumeral osteoarthritis. Posterior subluxation of the RIGHT humeral head.  Lungs: Atelectasis is present in the dependent portions of the LEFT lung. There is compressive atelectasis involving the RIGHT middle and RIGHT lower lobes. This is secondary to a moderate RIGHT pleural effusion. Most of the RIGHT lower lobe is collapse with a small portion of the RIGHT middle lobe collapsed. 7 mm pulmonary nodule present in the aerated RIGHT middle lobe (image 35 series 7).  There is a branching area of high density in the  superior segment RIGHT lower lobe that suggests embolized bone cement from vertebral augmentation. This could also represent confluent calcifications associated with old granulomatous disease.  Central airways: Extensive calcification of the tracheobronchial tree.  Vasculature: Atherosclerosis. Ascending aortic ectasia, measuring 37 mm. Tortuosity of the thoracic aorta. The heart is displaced to the  LEFT. Extensive coronary artery atherosclerosis.  Effusions: Moderate loculated RIGHT pleural effusion at the lung base. This includes both anterior and posterior components and produces compressive atelectasis.  Lymphadenopathy: No axillary adenopathy. No gross mediastinal adenopathy. Hilar adenopathy not evaluated in the absence of contrast. Old granulomatous disease is present with calcified lymph nodes in the RIGHT hilum.  Esophagus: Normal.  Upper abdomen: Old granulomatous disease of the spleen.  Other: None.  IMPRESSION: 1. Moderate loculated RIGHT pleural effusion, likely producing shortness of breath. This produces substantial atelectasis of the RIGHT lower lobe and partial atelectasis of the RIGHT middle lobe. This could represent a parapneumonic effusion or malignant effusion. Ordinary effusion associated with CHF is also possible. 2. 7 mm RIGHT middle lobe pulmonary nodule. Although follow-up is typically recommended, this is at the discretion of the referring clinician in this patient of advanced age. Guideline recommendations as follows: If the patient is at high risk for bronchogenic carcinoma, follow-up chest CT at 3-26months is recommended. If the patient is at low risk for bronchogenic carcinoma, follow-up chest CT at 6-12 months is recommended. This recommendation follows the consensus statement: Guidelines for Management of Small Pulmonary Nodules Detected on CT Scans: A Statement from the [place] as published in Radiology 2005; 237:395-400. 3. Severe atherosclerosis and coronary artery  disease with ectasia of the ascending aorta. Again, follow-up is at the discretion of the referring clinician. Guideline recommendations as follows: Ectatic to mildly aneurysmal ascending thoracic aorta. Recommend annual imaging followup by CTA or MRA. This recommendation follows 2010 ACCF/AHA/AATS/ACR/ASA/SCA/SCAI/SIR/STS/SVM Guidelines for the Diagnosis and Management of Patients With Thoracic Aortic Disease. Circulation. 2010; 121: D326-Z124   Electronically Signed   By: Dereck Ligas M.D.   On: 08/06/2014 21:06   Dg Chest Port 1 View  08/06/2014   CLINICAL DATA:  Right-sided chest pain. Shortness of breath. Hypoxia.  EXAM: PORTABLE CHEST - 1 VIEW  COMPARISON:  09/29/2010  FINDINGS: New opacity is seen in the right mid and lower lung, which may be due to infiltrate or mass. Right pleural effusion cannot be excluded.  Left lung is clear.  Heart size is stable.  IMPRESSION: New ill-defined opacity in right mid and lower lung, which may be due to infiltrate or mass. Right pleural effusion cannot be excluded. Recommend continued chest radiographic follow-up or chest CT with contrast.   Electronically Signed   By: Earle Gell M.D.   On: 08/06/2014 18:15     EKG Interpretation None      CRITICAL CARE Performed by: Baron Sane L   Total critical care time: 35 minutes  Critical care time was exclusive of separately billable procedures and treating other patients.  Critical care was necessary to treat or prevent imminent or life-threatening deterioration.  Critical care was time spent personally by me on the following activities: development of treatment plan with patient and/or surrogate as well as nursing, discussions with consultants, evaluation of patient's response to treatment, examination of patient, obtaining history from patient or surrogate, ordering and performing treatments and interventions, ordering and review of laboratory studies, ordering and review of radiographic  studies, pulse oximetry and re-evaluation of patient's condition.  MDM   Final diagnoses:  Shortness of breath  HCAP (healthcare-associated pneumonia)    Filed Vitals:   08/06/14 2000  BP: 107/72  Pulse: 105  Temp:   Resp: 33   I have reviewed nursing notes, vital signs, and all appropriate lab and imaging results for this patient.  Patient febrile, tachycardic, tachypneic on new  O2 requirement. Patient with failed outpatient HCAP treatment, IV Abx started. Sepsis work up initiated. Will admit patient for further management and evaluation. Patient d/w with Dr. Colin Rhein, agrees with plan.    Harlow Mares, PA-C 08/07/14 0017  Debby Freiberg, MD 08/10/14 [phone]

## 2014-08-06 NOTE — Assessment & Plan Note (Addendum)
She is currently on Levaquin for pneumonia. There is a question on the CXR of mass vs pna and this will need to be evaluated in the future. Given her age and debility she is not likely to tolerate aggressive tx if there is a mass.

## 2014-08-06 NOTE — Progress Notes (Signed)
Patient ID: [name], female   DOB: 11-16-1916, 80 y.o.   MRN: [id number]    Patient ID: [name], female   DOB: 1917/05/11, 80 y.o.   MRN: [id number]  Nursing Home Location:  [facility]   Code Status: DNR    Place of Service: SNF [phone]  Chief Complaint  Patient presents with  . Acute Visit    f/u rib pain    HPI:  80 y.o.  Female residing at [facility], skilled section. I was asked to see her today for rib pain on the right that worsens with a deep breath. She was seen on 08/04/14 and placed on Levaquin for a possible pna found on xray to the right side, although there was question of a mass. Since that time she had reported feeling better this morning and having less pain. However, when she got up to go to the bathroom this afternoon , she experience SOB and tachycardia. Her POA is at the bedside. They previously did not want her to go to the hospital but because of her discomfort they have decided that they would like her evaluated. She is a DNR but is receiving tx for medical issues as they arise.   Review of Systems:  Review of Systems  Constitutional: Negative for fever, chills, diaphoresis, activity change, appetite change and fatigue.  HENT: Negative for congestion, postnasal drip, rhinorrhea, sinus pressure and trouble swallowing.   Respiratory: Positive for shortness of breath. Negative for cough, wheezing and stridor.        Pain on inspiration  Cardiovascular: Negative for chest pain, palpitations and leg swelling.  Gastrointestinal: Negative for abdominal pain and abdominal distention.  Genitourinary: Negative for dysuria, flank pain and difficulty urinating.  Neurological: Negative for seizures, speech difficulty and light-headedness.  Psychiatric/Behavioral: Positive for confusion. Negative for behavioral problems and agitation.    Medications: Patient's Medications  New Prescriptions   No medications on file   Previous Medications   ACETAMINOPHEN (TYLENOL) 500 MG TABLET    Take 1,000 mg by mouth at bedtime.    AMLODIPINE (NORVASC) 2.5 MG TABLET    Take 2.5 mg by mouth daily.   BETA CAROTENE W/MINERALS (OCUVITE) TABLET    Take 1 tablet by mouth 2 (two) times daily.   CALCIUM CARB-CHOLECALCIFEROL (CALCIUM 600 + D PO)    Take 1 tablet by mouth 2 (two) times daily.   CETIRIZINE (ZYRTEC) 10 MG TABLET    Take 10 mg by mouth every morning.    FEEDING SUPPLEMENT, ENSURE COMPLETE, (ENSURE COMPLETE) LIQD    Take 237 mLs by mouth 3 (three) times daily between meals.   FLUTICASONE (VERAMYST) 27.5 MCG/SPRAY NASAL SPRAY    Place 1 spray into the nose daily.   LEVOFLOXACIN (LEVAQUIN) 500 MG TABLET    Take 500 mg by mouth daily.   MOEXIPRIL (UNIVASC) 15 MG TABLET    Take 15 mg by mouth every evening.   OMEPRAZOLE (PRILOSEC) 20 MG CAPSULE    Take 20 mg by mouth daily with lunch.    POLYETHYLENE GLYCOL (MIRALAX / GLYCOLAX) PACKET    Take 17 g by mouth every evening.    TRAMADOL (ULTRAM) 50 MG TABLET    Take by mouth every 6 (six) hours as needed.   VITAMIN B-12 (CYANOCOBALAMIN) 1000 MCG TABLET    Take 1,000 mcg by mouth daily.    ZOLPIDEM (AMBIEN) 10 MG TABLET    Take 10 mg by mouth at bedtime as needed  for sleep.   Modified Medications   No medications on file  Discontinued Medications   No medications on file     Physical Exam:  Filed Vitals:   08/06/14 1535  BP: 110/71  Pulse: 105  Temp: 100.4 F (38 C)  Resp: 24  SpO2: 92%    Physical Exam  Constitutional: No distress.  frail  Cardiovascular: Normal rate, regular rhythm and normal heart sounds.   No murmur heard. Pulmonary/Chest:  Crackles in the bases, decreased throughout. Mildly distressed due to pain. No bruising or deformity to the area  Abdominal: Soft. Bowel sounds are normal. She exhibits no distension. There is no tenderness.  No CVA tenderness  Neurological: She is alert. No cranial nerve deficit.  Oriented x2  Skin: Skin is warm  and dry. No rash noted. She is not diaphoretic. No erythema.  Psychiatric: Affect normal.    Labs reviewed/Significant Diagnostic Results:  08/03/14: PCXR: abnormal opacity in the right lung base, possibly pneumonia, but with a dominant round appearance density of concern for an actual mass lesion here until proven othewise. No pneumothorax  Basic Metabolic Panel:  Recent Labs  10/08/13 1510 10/09/13 0502 10/10/13 0402  NA 137 140 140  K 3.8 3.4* 4.1  CL 95* 101 103  CO2 32 27 25  GLUCOSE 109* 101* 100*  BUN 22 15 15   CREATININE 0.78 0.86 0.79  CALCIUM 10.1 9.3 9.7   Liver Function Tests: No results for input(s): AST, ALT, ALKPHOS, BILITOT, PROT, ALBUMIN in the last 8760 hours. No results for input(s): LIPASE, AMYLASE in the last 8760 hours. No results for input(s): AMMONIA in the last 8760 hours. CBC:  Recent Labs  10/09/13 0502 10/09/13 1845 10/10/13 0402 10/11/13 0527  WBC 6.0  --  6.2 5.3  HGB 11.1* 12.1 11.0* 11.2*  HCT 35.2* 38.0 34.7* 34.8*  MCV 91.2  --  91.1 90.4  PLT 299  --  306 329      Assessment/Plan  Rib pain She reports that the pain has improved with the initiation of Levaquin. I am concerned that there could be a PE given the acute onset of the pain and that it is associated with inspiration. She is tachycardic and having some trouble breathing at this time that was not present earlier. Earlier in the day the resident and POA had refused to go to the hospital but she appears worse now.   I discussed this with the resident and POA and we have decided to send her to the ER. I spent 8min in consultation with the nurse, resident, and POA on this case.   HCAP (healthcare-associated pneumonia) She is currently on Levaquin for pneumonia. There is a question on the CXR of mass vs pna and this will need to be evaluated in the future. Given her age and debility she is not likely to tolerate aggressive tx if there is a mass.     Cindi Carbon,  ANP [facility] [phone]

## 2014-08-06 NOTE — H&P (Signed)
[facility] History and Physical  LORAIN FETTES NGE:[id number] DOB: 1916-09-07 DOA: 08/06/2014  Referring physician: ED physician PCP: Mathews Argyle, MD  Specialists:   Chief Complaint: Shortness of breath  HPI: [name] is a 80 y.o. female with past medical history of remote GI bleeding due to diverticulosis, hypertension, GERD, dementia, who presents with shortness of breath.  Patient is a resident of SNF. She started having shortness of breath 3 days ago. She was diagnosed with pneumonia and has been treated with oral Levaquin. Her symptoms has been progressively getting worse. She has fever and chills with temperature 100.4 today. She does not have cough. She has tenderness over right lower rib cage area.   She denies headaches, cough, chest pain, abdominal pain, diarrhea, constipation, dysuria, urgency, frequency, hematuria, skin rashes, or leg swelling.  Work up in the ED demonstrates infiltrate over RML and LLL on CXR and 7 mm RIGHT middle lobe pulmonary nodule on CT-chest. Patient is admitted to the inpatient further evaluation and treatment.  Review of Systems: As presented in the history of presenting illness, rest negative.  Where does patient live?  SNF Can patient participate in ADLs? none  Allergy:  Allergies  Allergen Reactions  . Celebrex [Celecoxib] Itching  . Morphine And Related Nausea Only  . Vicodin [Hydrocodone-Acetaminophen] Itching  . Sulfa Antibiotics Rash    Past Medical History  Diagnosis Date  . Arthritis   . Carpal tunnel syndrome on left   . DJD (degenerative joint disease)   . Diverticulosis   . Macular degeneration syndrome   . GERD (gastroesophageal reflux disease)   . Headache(784.0)   . Wears glasses   . Wears hearing aid     both  . Wears partial dentures     top and bottom  . High blood pressure     echo 09-normal  . Lower GI bleed 06/2013    required 2 units PRBC    Past Surgical History  Procedure  Laterality Date  . Appendectomy    . Hip surgery      left  . Knee surgery    . Tonsillectomy    . Thyroid cyst excision    . Eye surgery      cataractas  . Carpal tunnel release  2/12    rt  . Joint replacement  2006    rt total hip x2  . Carpal tunnel release  04/23/2012    Procedure: CARPAL TUNNEL RELEASE;  Surgeon: Wynonia Sours, MD;  Location: [facility];  Service: Orthopedics;  Laterality: Left;    Social History:  reports that she quit smoking about 41 years ago. She has never used smokeless tobacco. She reports that she drinks alcohol. Her drug history is not on file.  Family History:  Family History  Problem Relation Age of Onset  . Heart attack Mother   . Diabetes Father      Prior to Admission medications   Medication Sig Start Date End Date Taking? Authorizing Provider  acetaminophen (TYLENOL) 500 MG tablet Take 1,000 mg by mouth 4 (four) times daily -  with meals and at bedtime.    Yes Historical Provider, MD  amLODipine (NORVASC) 2.5 MG tablet Take 2.5 mg by mouth daily.   Yes Historical Provider, MD  beta carotene w/minerals (OCUVITE) tablet Take 1 tablet by mouth 2 (two) times daily.   Yes Historical Provider, MD  Calcium Carb-Cholecalciferol (CALCIUM 600 + D PO) Take 1 tablet by mouth 2 (two)  times daily.   Yes Historical Provider, MD  cetirizine-pseudoephedrine (ZYRTEC-D) 5-120 MG per tablet Take 1 tablet by mouth every morning.   Yes Historical Provider, MD  feeding supplement, ENSURE COMPLETE, (ENSURE COMPLETE) LIQD Take 237 mLs by mouth 3 (three) times daily between meals. 07/04/13  Yes Eugenie Filler, MD  fluticasone (VERAMYST) 27.5 MCG/SPRAY nasal spray Place 1 spray into the nose daily.   Yes Historical Provider, MD  hydrocortisone (ANUSOL-HC) 2.5 % rectal cream Place 1 application rectally as needed for hemorrhoids or itching.   Yes Historical Provider, MD  levofloxacin (LEVAQUIN) 500 MG tablet Take 500 mg by mouth daily. 08/04/14 08/10/14  Yes Historical Provider, MD  moexipril (UNIVASC) 15 MG tablet Take 15 mg by mouth every evening.   Yes Historical Provider, MD  omeprazole (PRILOSEC) 20 MG capsule Take 20 mg by mouth daily with lunch.    Yes Historical Provider, MD  polyethylene glycol (MIRALAX / GLYCOLAX) packet Take 17 g by mouth every evening.    Yes Historical Provider, MD  traMADol (ULTRAM) 50 MG tablet Take by mouth every 6 (six) hours as needed for moderate pain.    Yes Historical Provider, MD  vitamin B-12 (CYANOCOBALAMIN) 1000 MCG tablet Take 1,000 mcg by mouth daily.    Yes Historical Provider, MD  zolpidem (AMBIEN) 5 MG tablet Take 5 mg by mouth at bedtime.   Yes Historical Provider, MD  cetirizine (ZYRTEC) 10 MG tablet Take 10 mg by mouth every morning.     Historical Provider, MD  zolpidem (AMBIEN) 10 MG tablet Take 10 mg by mouth at bedtime as needed for sleep.     Historical Provider, MD    Physical Exam: Filed Vitals:   08/06/14 1915 08/06/14 1930 08/06/14 1945 08/06/14 2000  BP: 119/64 108/69 113/64 107/72  Pulse: 99 104 103 105  Temp:      TempSrc:      Resp: 26 25 26  33  Height:      Weight:      SpO2: 96% 95% 95% 96%   General: Not in acute distress HEENT: has oral thrush       Eyes: PERRL, EOMI, no scleral icterus       ENT: No discharge from the ears and nose, no pharynx injection, no tonsillar enlargement.        Neck: No JVD, no bruit, no mass felt. Cardiac: S1/S2, RRR, No murmurs, No gallops or rubs Pulm: Good air movement bilaterally. Clear to auscultation bilaterally. No rales, wheezing, rhonchi or rubs. Abd: Soft, nondistended, nontender, no rebound pain, no organomegaly, BS present Ext: No edema bilaterally. 2+DP/PT pulse bilaterally Musculoskeletal: No joint deformities, erythema, or stiffness, ROM full Skin: there are small area of bruise over right shin Neuro: Alert and oriented X3, cranial nerves II-XII grossly intact, muscle strength 5/5 in all extremeties, sensation to light touch  intact. Moves all extremities. Psych: Patient is not psychotic, no suicidal or hemocidal ideation.  Labs on Admission:  Basic Metabolic Panel:  Recent Labs Lab 08/06/14 1817  NA 128*  K 4.1  CL 89*  CO2 22  GLUCOSE 112*  BUN 34*  CREATININE 0.87  CALCIUM 9.4   Liver Function Tests:  Recent Labs Lab 08/06/14 1817  AST 14  ALT 12  ALKPHOS 163*  BILITOT 0.3  PROT 6.5  ALBUMIN 2.2*   No results for input(s): LIPASE, AMYLASE in the last 168 hours. No results for input(s): AMMONIA in the last 168 hours. CBC:  Recent Labs Lab 08/06/14  1817  WBC 17.4*  NEUTROABS 14.4*  HGB 13.3  HCT 38.8  MCV 89.6  PLT 482*   Cardiac Enzymes:  Recent Labs Lab 08/06/14 1817 08/06/14 2232  TROPONINI <0.30 <0.30    BNP (last 3 results) No results for input(s): PROBNP in the last 8760 hours. CBG: No results for input(s): GLUCAP in the last 168 hours.  Radiological Exams on Admission: Ct Chest Wo Contrast  08/06/2014   CLINICAL DATA:  Cough. Shortness of breath. Abnormal chest x-ray. Initial encounter.  EXAM: CT CHEST WITHOUT CONTRAST  TECHNIQUE: Multidetector CT imaging of the chest was performed following the standard protocol without IV contrast.  COMPARISON:  Chest radiograph 08/06/2014.  FINDINGS: Evaluation is mildly degraded by respiratory motion artifact.  Musculoskeletal: No aggressive osseous lesions. Likely chronic T6 compression fracture. Loose body is present in the superior SUBSCAPULARIS recess of the RIGHT shoulder. RIGHT rotator cuff muscular atrophy compatible with chronic tear. Severe RIGHT glenohumeral osteoarthritis. Posterior subluxation of the RIGHT humeral head.  Lungs: Atelectasis is present in the dependent portions of the LEFT lung. There is compressive atelectasis involving the RIGHT middle and RIGHT lower lobes. This is secondary to a moderate RIGHT pleural effusion. Most of the RIGHT lower lobe is collapse with a small portion of the RIGHT middle lobe  collapsed. 7 mm pulmonary nodule present in the aerated RIGHT middle lobe (image 35 series 7).  There is a branching area of high density in the superior segment RIGHT lower lobe that suggests embolized bone cement from vertebral augmentation. This could also represent confluent calcifications associated with old granulomatous disease.  Central airways: Extensive calcification of the tracheobronchial tree.  Vasculature: Atherosclerosis. Ascending aortic ectasia, measuring 37 mm. Tortuosity of the thoracic aorta. The heart is displaced to the LEFT. Extensive coronary artery atherosclerosis.  Effusions: Moderate loculated RIGHT pleural effusion at the lung base. This includes both anterior and posterior components and produces compressive atelectasis.  Lymphadenopathy: No axillary adenopathy. No gross mediastinal adenopathy. Hilar adenopathy not evaluated in the absence of contrast. Old granulomatous disease is present with calcified lymph nodes in the RIGHT hilum.  Esophagus: Normal.  Upper abdomen: Old granulomatous disease of the spleen.  Other: None.  IMPRESSION: 1. Moderate loculated RIGHT pleural effusion, likely producing shortness of breath. This produces substantial atelectasis of the RIGHT lower lobe and partial atelectasis of the RIGHT middle lobe. This could represent a parapneumonic effusion or malignant effusion. Ordinary effusion associated with CHF is also possible. 2. 7 mm RIGHT middle lobe pulmonary nodule. Although follow-up is typically recommended, this is at the discretion of the referring clinician in this patient of advanced age. Guideline recommendations as follows: If the patient is at high risk for bronchogenic carcinoma, follow-up chest CT at 3-23months is recommended. If the patient is at low risk for bronchogenic carcinoma, follow-up chest CT at 6-12 months is recommended. This recommendation follows the consensus statement: Guidelines for Management of Small Pulmonary Nodules Detected on  CT Scans: A Statement from the [employer] as published in Radiology 2005; 237:395-400. 3. Severe atherosclerosis and coronary artery disease with ectasia of the ascending aorta. Again, follow-up is at the discretion of the referring clinician. Guideline recommendations as follows: Ectatic to mildly aneurysmal ascending thoracic aorta. Recommend annual imaging followup by CTA or MRA. This recommendation follows 2010 ACCF/AHA/AATS/ACR/ASA/SCA/SCAI/SIR/STS/SVM Guidelines for the Diagnosis and Management of Patients With Thoracic Aortic Disease. Circulation. 2010; 121: Z610-R604   Electronically Signed   By: Dereck Ligas M.D.   On: 08/06/2014 21:06  Dg Chest Port 1 View  08/06/2014   CLINICAL DATA:  Right-sided chest pain. Shortness of breath. Hypoxia.  EXAM: PORTABLE CHEST - 1 VIEW  COMPARISON:  09/29/2010  FINDINGS: New opacity is seen in the right mid and lower lung, which may be due to infiltrate or mass. Right pleural effusion cannot be excluded.  Left lung is clear.  Heart size is stable.  IMPRESSION: New ill-defined opacity in right mid and lower lung, which may be due to infiltrate or mass. Right pleural effusion cannot be excluded. Recommend continued chest radiographic follow-up or chest CT with contrast.   Electronically Signed   By: Earle Gell M.D.   On: 08/06/2014 18:15    EKG: Independently reviewed. T-wave flattening in lead 1 and T-wave inversion in aVL.  Assessment/Plan Principal Problem:   HCAP (healthcare-associated pneumonia) Active Problems:   Hypertension   Dementia   GERD (gastroesophageal reflux disease)   Protein calorie malnutrition   DJD (degenerative joint disease)   Macular degeneration syndrome   Lower GI bleed   Lung nodule  HCAP: Patient's shortness of breath and fever are most consistent with HCAP, ss evidenced by chest x-ray and CT chest. She failed oral Levaquin treatment in nursing home. - will admit to tele bed - blood culture x 2 - urine  legionella and S. pneumococcal antigen - treat with Vancomycin and cefepime to cover HCAP.   - follow up sputum culture and respiratory virus panel - When necessary albuterol nebulizer - Repeat CXR in AM  Hypertension: -Hold Moexipril normal blood pressure and that the patient is at risk of developing sepsis. -continue amlodipine 2.5 mg daily  GERD: Protonix  Protein calorie malnutrition: -Ensure  DVT ppx: SQ Heparin Code Status: DNR Family Communication:  Yes, patient's  Care giver (Ms Baron Hamper)  at bed side Disposition Plan: Admit to inpatient   Date of Service 08/07/2014    Kairi Harshbarger, [facility] Pager [phone]  If 7PM-7AM, please contact night-coverage [email or web address] Password [email or web address] 08/07/2014, 3:28 AM

## 2014-08-06 NOTE — ED Notes (Signed)
Patient noted to have thrush like coating to the tongue Per caregiver, this is new for patient Patient recently started Levaquin

## 2014-08-06 NOTE — ED Notes (Signed)
Bed: BM15 Expected date:  Expected time:  Means of arrival:  Comments: Possible pna

## 2014-08-06 NOTE — Assessment & Plan Note (Addendum)
She reports that the pain has improved with the initiation of Levaquin. I am concerned that there could be a PE given the acute onset of the pain and that it is associated with inspiration. She is tachycardic and having some trouble breathing at this time that was not present earlier. Earlier in the day the resident and POA had refused to go to the hospital but she appears worse now.   I discussed this with the resident and POA and we have decided to send her to the ER. I spent 53min in consultation with the nurse, resident, and POA on this case.

## 2014-08-06 NOTE — ED Notes (Signed)
Patient incontinent of urine--wears brief In and Out cath to be performed to obtain urine

## 2014-08-07 ENCOUNTER — Inpatient Hospital Stay (HOSPITAL_COMMUNITY): Payer: Medicare Other

## 2014-08-07 ENCOUNTER — Encounter (HOSPITAL_COMMUNITY): Payer: Self-pay | Admitting: Internal Medicine

## 2014-08-07 DIAGNOSIS — J90 Pleural effusion, not elsewhere classified: Secondary | ICD-10-CM

## 2014-08-07 HISTORY — DX: Pleural effusion, not elsewhere classified: J90

## 2014-08-07 LAB — CBC WITH DIFFERENTIAL/PLATELET
Basophils Absolute: 0 10*3/uL (ref 0.0–0.1)
Basophils Relative: 0 % (ref 0–1)
Eosinophils Absolute: 0 10*3/uL (ref 0.0–0.7)
Eosinophils Relative: 0 % (ref 0–5)
HCT: 33.3 % — ABNORMAL LOW (ref 36.0–46.0)
Hemoglobin: 11.3 g/dL — ABNORMAL LOW (ref 12.0–15.0)
LYMPHS PCT: 15 % (ref 12–46)
Lymphs Abs: 2.2 10*3/uL (ref 0.7–4.0)
MCH: 30.1 pg (ref 26.0–34.0)
MCHC: 33.9 g/dL (ref 30.0–36.0)
MCV: 88.6 fL (ref 78.0–100.0)
MONO ABS: 1 10*3/uL (ref 0.1–1.0)
Monocytes Relative: 7 % (ref 3–12)
Neutro Abs: 10.8 10*3/uL — ABNORMAL HIGH (ref 1.7–7.7)
Neutrophils Relative %: 78 % — ABNORMAL HIGH (ref 43–77)
Platelets: 532 10*3/uL — ABNORMAL HIGH (ref 150–400)
RBC: 3.76 MIL/uL — AB (ref 3.87–5.11)
RDW: 13.5 % (ref 11.5–15.5)
WBC: 14 10*3/uL — ABNORMAL HIGH (ref 4.0–10.5)

## 2014-08-07 LAB — LACTATE DEHYDROGENASE, PLEURAL OR PERITONEAL FLUID: LD, Fluid: 926 U/L — ABNORMAL HIGH (ref 3–23)

## 2014-08-07 LAB — COMPREHENSIVE METABOLIC PANEL
ALT: 11 U/L (ref 0–35)
ANION GAP: 13 (ref 5–15)
AST: 14 U/L (ref 0–37)
Albumin: 1.9 g/dL — ABNORMAL LOW (ref 3.5–5.2)
Alkaline Phosphatase: 143 U/L — ABNORMAL HIGH (ref 39–117)
BUN: 27 mg/dL — ABNORMAL HIGH (ref 6–23)
CALCIUM: 9 mg/dL (ref 8.4–10.5)
CO2: 21 meq/L (ref 19–32)
CREATININE: 0.76 mg/dL (ref 0.50–1.10)
Chloride: 97 mEq/L (ref 96–112)
GFR calc Af Amer: 81 mL/min — ABNORMAL LOW (ref >90)
GFR, EST NON AFRICAN AMERICAN: 70 mL/min — AB (ref >90)
Glucose, Bld: 93 mg/dL (ref 70–99)
Potassium: 3.8 mEq/L (ref 3.7–5.3)
Sodium: 131 mEq/L — ABNORMAL LOW (ref 137–147)
Total Bilirubin: 0.3 mg/dL (ref 0.3–1.2)
Total Protein: 5.7 g/dL — ABNORMAL LOW (ref 6.0–8.3)

## 2014-08-07 LAB — BODY FLUID CELL COUNT WITH DIFFERENTIAL
Lymphs, Fluid: 11 %
Monocyte-Macrophage-Serous Fluid: 7 % — ABNORMAL LOW (ref 50–90)
Neutrophil Count, Fluid: 82 % — ABNORMAL HIGH (ref 0–25)
WBC FLUID: 1401 uL — AB (ref 0–1000)

## 2014-08-07 LAB — URINE CULTURE
COLONY COUNT: NO GROWTH
CULTURE: NO GROWTH

## 2014-08-07 LAB — PROTEIN, TOTAL: Total Protein: 5.5 g/dL — ABNORMAL LOW (ref 6.0–8.3)

## 2014-08-07 LAB — LACTATE DEHYDROGENASE: LDH: 153 U/L (ref 94–250)

## 2014-08-07 LAB — PRO B NATRIURETIC PEPTIDE: Pro B Natriuretic peptide (BNP): 1828 pg/mL — ABNORMAL HIGH (ref 0–450)

## 2014-08-07 LAB — TROPONIN I
Troponin I: <0.3 ng/mL (ref <0.30)
Troponin I: <0.3 ng/mL (ref <0.30)

## 2014-08-07 LAB — PROTEIN, BODY FLUID: Total protein, fluid: 4.3 g/dL

## 2014-08-07 LAB — STREP PNEUMONIAE URINARY ANTIGEN: Strep Pneumo Urinary Antigen: NEGATIVE

## 2014-08-07 MED ORDER — ALBUTEROL SULFATE (2.5 MG/3ML) 0.083% IN NEBU: 2.5000 mg | INHALATION_SOLUTION | RESPIRATORY_TRACT | Status: DC | PRN

## 2014-08-07 NOTE — Evaluation (Addendum)
Physical Therapy Evaluation Patient Details Name: [name] MRN: [id number] DOB: July 14, 1917 Today's Date: 08/07/2014   History of Present Illness  [name] is a 80 y.o. female adm with shortness of breath. past medical history of remote GI bleeding due to diverticulosis, hypertension, GERD, dementia  Clinical Impression  Pt will benefit from PT to address deficits below;  Will continue to follow and assess for needs; Recommend return to SNF, may benefit from PT at SNF    Follow Up Recommendations SNF    Equipment Recommendations  None recommended by PT    Recommendations for Other Services       Precautions / Restrictions Precautions Precautions: Fall      Mobility  Bed Mobility Overal bed mobility: Needs Assistance Bed Mobility: Supine to Sit     Supine to sit: Min assist;Mod assist     General bed mobility comments: incr time and cues for  self assist  Transfers Overall transfer level: Needs assistance Equipment used: Rolling walker (2 wheeled) Transfers: Sit to/from [facility] Sit to Stand: Mod assist Stand pivot transfers: Mod assist       General transfer comment: cues for hand placement and safety, control of descent  Ambulation/Gait Ambulation/Gait assistance: Min assist;Mod assist Ambulation Distance (Feet): 12 Feet Assistive device: Rolling walker (2 wheeled) Gait Pattern/deviations: Trunk flexed;Narrow base of support;Step-through pattern;Decreased stride length;Shuffle     General Gait Details: multi-modal cues for RW position, posture, breathing, assist throughout for balance and to maneuver RW  Stairs            Wheelchair Mobility    Modified Rankin (Stroke Patients Only)       Balance Overall balance assessment: Needs assistance Sitting-balance support: Feet supported;Bilateral upper extremity supported Sitting balance-Leahy Scale: Fair       Standing balance-Leahy Scale: Poor                                Pertinent Vitals/Pain Pain Assessment: Faces Faces Pain Scale: Hurts a little bit Pain Intervention(s): Limited activity within patient's tolerance  O2 sats 91% -- 92%--97%, on 2L [place] O2 HR 102--109--99 2-3/4 DOE     Home Living Family/patient expects to be discharged to:: Skilled nursing facility                 Additional Comments: has caregiver 5 hrs per day and was in SNF for nursing care prior to admission    Prior Function Level of Independence: Needs assistance   Gait / Transfers Assistance Needed: pt amb RW and min/guard assist about 30-40' at her baseline--per caregiver           Hand Dominance        Extremity/Trunk Assessment   Upper Extremity Assessment: Generalized weakness           Lower Extremity Assessment: Generalized weakness         Communication   Communication: HOH  Cognition Arousal/Alertness: Awake/alert Behavior During Therapy: Flat affect Overall Cognitive Status: Impaired/Different from baseline                      General Comments      Exercises        Assessment/Plan    PT Assessment Patient needs continued PT services  PT Diagnosis Difficulty walking;Generalized weakness   PT Problem List Decreased strength;Decreased range of motion;Decreased balance;Decreased mobility;Decreased activity tolerance;Cardiopulmonary status limiting activity  PT  Treatment Interventions DME instruction;Gait training;Functional mobility training;Therapeutic activities;Therapeutic exercise;Patient/family education   PT Goals (Current goals can be found in the Care Plan section) Acute Rehab PT Goals Patient Stated Goal: pt does not state but is agreeable to therapy PT Goal Formulation: With patient Time For Goal Achievement: 08/21/14 Potential to Achieve Goals: Fair    Frequency Min 3X/week   Barriers to discharge        Co-evaluation               End of Session Equipment Utilized  During Treatment: Gait belt Activity Tolerance: Patient limited by fatigue Patient left: in chair;with family/visitor present;with call bell/phone within reach Nurse Communication: Mobility status         Time: 1314-3888 PT Time Calculation (min) (ACUTE ONLY): 25 min   Charges:   PT Evaluation $Initial PT Evaluation Tier I: 1 Procedure PT Treatments $Gait Training: 8-22 mins $Therapeutic Activity: 8-22 mins   PT G Codes:          Erabella Kuipers 20-Aug-2014, 1:23 PM

## 2014-08-07 NOTE — Procedures (Signed)
Thoracentesis Procedure Note  Pre-operative Diagnosis: Right pleural effusion  Post-operative Diagnosis: same  Indications: Right pleural effusion  Procedure Details  Consent: Informed consent was obtained. Risks of the procedure were discussed including: infection, bleeding, pain, pneumothorax.  Under sterile conditions the patient was positioned. Betadine solution and sterile drapes were utilized.  1% buffered lidocaine was used to anesthetize the right 7th  rib space. Fluid was obtained without any difficulties and minimal blood loss.  A dressing was applied to the wound and wound care instructions were provided.   Findings US showed pleural fluid as an echo free space bounded by chest wall, atelectatic lung & diaphragm. An echogenic shadow noted in pleural fluid prob fibrinous material 100 ml of clear straw colored pleural fluid was obtained. More fluid could not be obtained. A sample was sent to Pathology for cytology, chemistry, and cell counts, as well as for infection analysis.  Complications:  None; patient tolerated the procedure well.          Condition: stable  Plan A follow up chest x-ray was ordered. Bed Rest for 2 hours. Tylenol 650 mg. for pain.  Attending Attestation: I was present and scrubbed for the entire procedure.  Rigoberto Noel MD

## 2014-08-07 NOTE — Progress Notes (Signed)
Patient ID: [name]  female  DXI:[id number]    DOB: May 27, 1917    DOA: 08/06/2014  PCP: Mathews Argyle, MD   Patient is a 80 year old female with remote history of GI bleeding due to diverticulosis, hypertension, GERD, dementia presented with shortness of breath from skilled nursing facility. Patient was diagnosed with pneumonia and was being treated with oral Levaquin however her symptoms had been progressively getting worse. Patient was having fevers, chills with temp of 100.4 on the day of admission, no coughing and tenderness over the right lower rib cage area. Workup in the ED demonstrated infiltrate over the RML, LDL L on chest x-ray, CT chest also showed moderate right-sided pleural effusion, could be loculated and parapneumonic. Patient was admitted for further workup.  Assessment/Plan: Principal Problem:   HCAP (healthcare-associated pneumonia): Continue IV vancomycin, cefepime - Follow blood cultures, urine legionella antigen, strep antigen, Respiratory virus panel, sputum cultures   -  patient also has moderate pleural effusion on the right, possibly parapneumonic, will need thoracentesis  - Patient denies any choking episodes with food, confirmed with the private sitter at the bedside  Active Problems:   Moderate Pleural effusion - CT chest showed moderate loculated right pleural effusion, consulted pulmonology for thoracentesis and studies, d/w Dr Elsworth Soho    Hypertension - Currently stable, continue amlodipine    Dementia - Currently stable, at baseline    GERD (gastroesophageal reflux disease) - Continue PPI    Protein calorie malnutrition - Continue nutritional supplements    Lung nodule - 7 mL right middle lobe pulmonary nodule, outpatient followup per PCPs discretion given advanced age    DVT Prophylaxis:  Code Status: DO NOT RESUSCITATE   Family Communication:  Disposition:  Consultants:  Pulmonology   Procedures:  CT chest    Antibiotics:  IV vancomycin 12/10  IV cefepime 12/10   Subjective: Patient seen and examined, but reports that her shortness of breath is improving, afebrile this morning, no nausea or vomiting, no choking episodes, private sitter at the bedside  Objective: Weight change:   Intake/Output Summary (Last 24 hours) at 08/07/14 1058 Last data filed at 08/07/14 0700  Gross per 24 hour  Intake    715 ml  Output      0 ml  Net    715 ml   Blood pressure 116/69, pulse 100, temperature 98.4 F (36.9 C), temperature source Oral, resp. rate 22, height 5\' 3"  (1.6 m), weight 43.545 kg (96 lb), SpO2 98 %.  Physical Exam: General: Alert and awake, oriented x3, not in any acute distress. CVS: S1-S2 clear, no murmur rubs or gallops Chest: Decreased breath sound at the bases R>L Abdomen: soft nontender, nondistended, normal bowel sounds  Extremities: no cyanosis, clubbing or edema noted bilaterally Neuro: Cranial nerves II-XII intact, no focal neurological deficits  Lab Results: Basic Metabolic Panel:  Recent Labs Lab 08/06/14 1817 08/07/14 0354  NA 128* 131*  K 4.1 3.8  CL 89* 97  CO2 22 21  GLUCOSE 112* 93  BUN 34* 27*  CREATININE 0.87 0.76  CALCIUM 9.4 9.0   Liver Function Tests:  Recent Labs Lab 08/06/14 1817 08/07/14 0354  AST 14 14  ALT 12 11  ALKPHOS 163* 143*  BILITOT 0.3 0.3  PROT 6.5 5.7*  ALBUMIN 2.2* 1.9*   No results for input(s): LIPASE, AMYLASE in the last 168 hours. No results for input(s): AMMONIA in the last 168 hours. CBC:  Recent Labs Lab 08/06/14 1817 08/07/14 0354  WBC 17.4*  14.0*  NEUTROABS 14.4* 10.8*  HGB 13.3 11.3*  HCT 38.8 33.3*  MCV 89.6 88.6  PLT 482* 532*   Cardiac Enzymes:  Recent Labs Lab 08/06/14 2232 08/07/14 0354 08/07/14 0958  TROPONINI <0.30 <0.30 <0.30   BNP: Invalid input(s): POCBNP CBG: No results for input(s): GLUCAP in the last 168 hours.   Micro Results: Recent Results (from the past 240 hour(s))   Blood Culture (routine x 2)     Status: None (Preliminary result)   Collection Time: 08/06/14  6:13 PM  Result Value Ref Range Status   Specimen Description BLOOD RIGHT ANTECUBITAL  Final   Special Requests BOTTLES DRAWN AEROBIC AND ANAEROBIC 5ML  Final   Culture  Setup Time   Final    08/06/2014 23:02 Performed at [facility]    Culture   Final           BLOOD CULTURE RECEIVED NO GROWTH TO DATE CULTURE WILL BE HELD FOR 5 DAYS BEFORE ISSUING A FINAL NEGATIVE REPORT Performed at [facility]    Report Status PENDING  Incomplete  Blood Culture (routine x 2)     Status: None (Preliminary result)   Collection Time: 08/06/14  6:34 PM  Result Value Ref Range Status   Specimen Description BLOOD RAC  Final   Special Requests BOTTLES DRAWN AEROBIC AND ANAEROBIC 5ML  Final   Culture  Setup Time   Final    08/06/2014 23:03 Performed at [facility]    Culture   Final           BLOOD CULTURE RECEIVED NO GROWTH TO DATE CULTURE WILL BE HELD FOR 5 DAYS BEFORE ISSUING A FINAL NEGATIVE REPORT Performed at [facility]    Report Status PENDING  Incomplete    Studies/Results: Ct Chest Wo Contrast  08/06/2014   CLINICAL DATA:  Cough. Shortness of breath. Abnormal chest x-ray. Initial encounter.  EXAM: CT CHEST WITHOUT CONTRAST  TECHNIQUE: Multidetector CT imaging of the chest was performed following the standard protocol without IV contrast.  COMPARISON:  Chest radiograph 08/06/2014.  FINDINGS: Evaluation is mildly degraded by respiratory motion artifact.  Musculoskeletal: No aggressive osseous lesions. Likely chronic T6 compression fracture. Loose body is present in the superior SUBSCAPULARIS recess of the RIGHT shoulder. RIGHT rotator cuff muscular atrophy compatible with chronic tear. Severe RIGHT glenohumeral osteoarthritis. Posterior subluxation of the RIGHT humeral head.  Lungs: Atelectasis is present in the dependent portions of the LEFT lung. There is  compressive atelectasis involving the RIGHT middle and RIGHT lower lobes. This is secondary to a moderate RIGHT pleural effusion. Most of the RIGHT lower lobe is collapse with a small portion of the RIGHT middle lobe collapsed. 7 mm pulmonary nodule present in the aerated RIGHT middle lobe (image 35 series 7).  There is a branching area of high density in the superior segment RIGHT lower lobe that suggests embolized bone cement from vertebral augmentation. This could also represent confluent calcifications associated with old granulomatous disease.  Central airways: Extensive calcification of the tracheobronchial tree.  Vasculature: Atherosclerosis. Ascending aortic ectasia, measuring 37 mm. Tortuosity of the thoracic aorta. The heart is displaced to the LEFT. Extensive coronary artery atherosclerosis.  Effusions: Moderate loculated RIGHT pleural effusion at the lung base. This includes both anterior and posterior components and produces compressive atelectasis.  Lymphadenopathy: No axillary adenopathy. No gross mediastinal adenopathy. Hilar adenopathy not evaluated in the absence of contrast. Old granulomatous disease is present with calcified lymph nodes in the RIGHT  hilum.  Esophagus: Normal.  Upper abdomen: Old granulomatous disease of the spleen.  Other: None.  IMPRESSION: 1. Moderate loculated RIGHT pleural effusion, likely producing shortness of breath. This produces substantial atelectasis of the RIGHT lower lobe and partial atelectasis of the RIGHT middle lobe. This could represent a parapneumonic effusion or malignant effusion. Ordinary effusion associated with CHF is also possible. 2. 7 mm RIGHT middle lobe pulmonary nodule. Although follow-up is typically recommended, this is at the discretion of the referring clinician in this patient of advanced age. Guideline recommendations as follows: If the patient is at high risk for bronchogenic carcinoma, follow-up chest CT at 3-35months is recommended. If the  patient is at low risk for bronchogenic carcinoma, follow-up chest CT at 6-12 months is recommended. This recommendation follows the consensus statement: Guidelines for Management of Small Pulmonary Nodules Detected on CT Scans: A Statement from the [employer] as published in Radiology 2005; 237:395-400. 3. Severe atherosclerosis and coronary artery disease with ectasia of the ascending aorta. Again, follow-up is at the discretion of the referring clinician. Guideline recommendations as follows: Ectatic to mildly aneurysmal ascending thoracic aorta. Recommend annual imaging followup by CTA or MRA. This recommendation follows 2010 ACCF/AHA/AATS/ACR/ASA/SCA/SCAI/SIR/STS/SVM Guidelines for the Diagnosis and Management of Patients With Thoracic Aortic Disease. Circulation. 2010; 121: W263-Z858   Electronically Signed   By: Dereck Ligas M.D.   On: 08/06/2014 21:06   Dg Chest Port 1 View  08/06/2014   CLINICAL DATA:  Right-sided chest pain. Shortness of breath. Hypoxia.  EXAM: PORTABLE CHEST - 1 VIEW  COMPARISON:  09/29/2010  FINDINGS: New opacity is seen in the right mid and lower lung, which may be due to infiltrate or mass. Right pleural effusion cannot be excluded.  Left lung is clear.  Heart size is stable.  IMPRESSION: New ill-defined opacity in right mid and lower lung, which may be due to infiltrate or mass. Right pleural effusion cannot be excluded. Recommend continued chest radiographic follow-up or chest CT with contrast.   Electronically Signed   By: Earle Gell M.D.   On: 08/06/2014 18:15    Medications: Scheduled Meds: . acetaminophen  1,000 mg Oral TID WC & HS  . amLODipine  2.5 mg Oral Daily  . beta carotene w/minerals  1 tablet Oral BID  . calcium-vitamin D  1 tablet Oral BID WC  . ceFEPime (MAXIPIME) IV  1 g Intravenous Q24H  . feeding supplement (ENSURE COMPLETE)  237 mL Oral TID BM  . fluticasone  1 spray Each Nare Daily  . heparin  5,000 Units Subcutaneous 3 times per day    . loratadine  10 mg Oral Daily  . nystatin  5 mL Mouth/Throat QID  . pantoprazole  40 mg Oral Daily  . polyethylene glycol  17 g Oral QPM  . vancomycin  750 mg Intravenous Q24H  . vitamin B-12  1,000 mcg Oral Daily      LOS: 1 day   Johany Hansman M.D. [facility] 08/07/2014, 10:58 AM Pager: [phone]  If 7PM-7AM, please contact night-coverage [email or web address] Password [email or web address]

## 2014-08-07 NOTE — Progress Notes (Signed)
Clinical Social Work Department BRIEF PSYCHOSOCIAL ASSESSMENT 08/07/2014  Patient:  [name], [name]     Account Number:  [id number]     Admit date:  08/06/2014  Clinical Social Worker:  Earlie Server  Date/Time:  08/07/2014 12:30 PM  Referred by:  Physician  Date Referred:  08/07/2014 Referred for  SNF Placement   Other Referral:   Interview type:  Patient Other interview type:    PSYCHOSOCIAL DATA Living Status:  FACILITY Admitted from facility:  [facility] Level of care:  [place] Primary support name:  Belenda Cruise Primary support relationship to patient:  FRIEND Degree of support available:   Adequate    CURRENT CONCERNS Current Concerns  Post-Acute Placement   Other Concerns:    SOCIAL WORK ASSESSMENT / PLAN CSW received referral in order to assist with DC planning. CSW reviewed chart and met with patient and caregiver at bedside. CSW introduced myself and explained role.    Patient reports that she lives at [facility] and has been there for about 26 years. Patient currently living in skilled care and plans to return at DC. Patient reports family is aware that she is at the hospital. Per chart review, Montez Hageman is HCPOA.  CSW called and left a message with HCPOA as well.    CSW completed FL2 and spoke with Rosendo Gros at [facility] who is agreeable to accept patient at DC.   Assessment/plan status:  Psychosocial Support/Ongoing Assessment of Needs Other assessment/ plan:   Information/referral to community resources:   Will return to SNF    PATIENT'S/FAMILY'S RESPONSE TO PLAN OF CARE: Patient alert but minimally engaged. Patient reports she just had a procedure completed and she feels ill. Patient reports that her caregiver assists her as needed and she knows she will go back to [facility] when better. Patient agreeable for HCPOA to be involved and that CSW will assist with transfer back to SNF.     [place], [place] [phone]

## 2014-08-07 NOTE — Progress Notes (Signed)
OT Cancellation Note  Patient Details Name: ALEENA KIRKEBY MRN: [id number] DOB: [birth year]   Cancelled Treatment:    Reason Eval/Treat Not Completed: OT screened, pt is from a SNF where she needs staff A with ADLs, all further OT needs can be met at SNF, will sign off  Gerhardt Gleed A 08/07/2014, 8:30 AM

## 2014-08-07 NOTE — Consult Note (Signed)
Name: [name] MRN: [id number] DOB: [birth year]    ADMISSION DATE:  08/06/2014 CONSULTATION DATE:  12/11  REFERRING MD :  RAI  CHIEF COMPLAINT:  Pleural effusion  BRIEF PATIENT DESCRIPTION:  This is a 80 year old female who was admitted on 12/10 to IM service from SNF w/ working Dx of HCAP w/ loculated effusion. PCCM was asked to see re: effusion.   SIGNIFICANT EVENTS  Right thoracentesis 12/11: drained about 100 ml. Still had fluid seemingly loculated to right.  LDH 12/11>>> Protein 12/11>>> Cytology 12/11>>> culture 12/11>>> Fungal 12/11>>> AFB 12/11>>>  STUDIES:  CT scan 12/10: mod loculated right pleural effusion, 26mm right ML pulmonary nodule   HISTORY OF PRESENT ILLNESS:   This is a 80 year old female who resides at SNF. She has a sig h/o GI bleeding in the setting of Diverticular disease, HTN, GERD and dementia. Per history she began to c/w dyspnea about three days prior to admission. She had low grade temp and was started on Levaquin w/ clinical dx of PNA. Her symptoms worsened and began to be associated w/ pleuritic type CP over right rib cage. She denied cough. She was seen in the ER & admitted by IM service w/ working dx of HCAP. A CT chest was obtained and showed mod right pleural effusion which was the reason for PCCM consult.   PAST MEDICAL HISTORY :   has a past medical history of Arthritis; Carpal tunnel syndrome on left; DJD (degenerative joint disease); Diverticulosis; Macular degeneration syndrome; GERD (gastroesophageal reflux disease); Headache(784.0); Wears glasses; Wears hearing aid; Wears partial dentures; High blood pressure; and Lower GI bleed (06/2013).  has past surgical history that includes Appendectomy; Hip surgery; Knee surgery; Tonsillectomy; Thyroid cyst excision; Eye surgery; Carpal tunnel release (2/12); Joint replacement (2006); and Carpal tunnel release (04/23/2012). Prior to Admission medications   Medication Sig Start Date End Date  Taking? Authorizing Provider  acetaminophen (TYLENOL) 500 MG tablet Take 1,000 mg by mouth 4 (four) times daily -  with meals and at bedtime.    Yes Historical Provider, MD  amLODipine (NORVASC) 2.5 MG tablet Take 2.5 mg by mouth daily.   Yes Historical Provider, MD  beta carotene w/minerals (OCUVITE) tablet Take 1 tablet by mouth 2 (two) times daily.   Yes Historical Provider, MD  Calcium Carb-Cholecalciferol (CALCIUM 600 + D PO) Take 1 tablet by mouth 2 (two) times daily.   Yes Historical Provider, MD  cetirizine-pseudoephedrine (ZYRTEC-D) 5-120 MG per tablet Take 1 tablet by mouth every morning.   Yes Historical Provider, MD  feeding supplement, ENSURE COMPLETE, (ENSURE COMPLETE) LIQD Take 237 mLs by mouth 3 (three) times daily between meals. 07/04/13  Yes Eugenie Filler, MD  fluticasone (VERAMYST) 27.5 MCG/SPRAY nasal spray Place 1 spray into the nose daily.   Yes Historical Provider, MD  hydrocortisone (ANUSOL-HC) 2.5 % rectal cream Place 1 application rectally as needed for hemorrhoids or itching.   Yes Historical Provider, MD  levofloxacin (LEVAQUIN) 500 MG tablet Take 500 mg by mouth daily. 08/04/14 08/10/14 Yes Historical Provider, MD  moexipril (UNIVASC) 15 MG tablet Take 15 mg by mouth every evening.   Yes Historical Provider, MD  omeprazole (PRILOSEC) 20 MG capsule Take 20 mg by mouth daily with lunch.    Yes Historical Provider, MD  polyethylene glycol (MIRALAX / GLYCOLAX) packet Take 17 g by mouth every evening.    Yes Historical Provider, MD  traMADol (ULTRAM) 50 MG tablet Take by mouth every 6 (six)  hours as needed for moderate pain.    Yes Historical Provider, MD  vitamin B-12 (CYANOCOBALAMIN) 1000 MCG tablet Take 1,000 mcg by mouth daily.    Yes Historical Provider, MD  zolpidem (AMBIEN) 5 MG tablet Take 5 mg by mouth at bedtime.   Yes Historical Provider, MD  cetirizine (ZYRTEC) 10 MG tablet Take 10 mg by mouth every morning.     Historical Provider, MD  zolpidem (AMBIEN) 10 MG  tablet Take 10 mg by mouth at bedtime as needed for sleep.     Historical Provider, MD   Allergies  Allergen Reactions  . Celebrex [Celecoxib] Itching  . Morphine And Related Nausea Only  . Vicodin [Hydrocodone-Acetaminophen] Itching  . Sulfa Antibiotics Rash    FAMILY HISTORY:  family history includes Diabetes in her father; Heart attack in her mother. SOCIAL HISTORY:  reports that she quit smoking about 41 years ago. She has never used smokeless tobacco. She reports that she drinks alcohol.  REVIEW OF SYSTEMS:   Constitutional: Negative for fever, chills, weight loss, malaise/fatigue and diaphoresis.  HENT: Negative for hearing loss, ear pain, nosebleeds, congestion, sore throat, neck pain, tinnitus and ear discharge.   Eyes: Negative for blurred vision, double vision, photophobia, pain, discharge and redness.  Respiratory: Negative for cough, hemoptysis, sputum production, shortness of breath. W/ pleuritic type CP, wheezing and stridor.   Cardiovascular: Negative for chest pain, palpitations, orthopnea, claudication, leg swelling and PND.  Gastrointestinal: Negative for heartburn, nausea, vomiting, abdominal pain, diarrhea, constipation, blood in stool and melena.  Genitourinary: Negative for dysuria, urgency, frequency, hematuria and flank pain.  Musculoskeletal: Negative for myalgias, back pain, joint pain and falls.  Skin: Negative for itching and rash.  Neurological: Negative for dizziness, tingling, tremors, sensory change, speech change, focal weakness, seizures, loss of consciousness, weakness and headaches.  Endo/Heme/Allergies: Negative for environmental allergies and polydipsia. Does not bruise/bleed easily.  SUBJECTIVE:  No distress   VITAL SIGNS: Temp:  [98.2 F (36.8 C)-100.4 F (38 C)] 98.4 F (36.9 C) (12/11 0701) Pulse Rate:  [99-114] 100 (12/11 0701) Resp:  [18-38] 22 (12/11 0701) BP: (107-139)/(64-72) 116/69 mmHg (12/11 0701) SpO2:  [92 %-98 %] 98 % (12/11  0701) Weight:  [43.545 kg (96 lb)] 43.545 kg (96 lb) (12/10 1822)  PHYSICAL EXAMINATION: General:  Elderly white female, resting comfortably up in chair  Neuro:  Hard of hearing but otherwise no focal def  HEENT:  NCAT Cardiovascular:  rrr Lungs:  Decreased on right.  Abdomen:  Soft, non-tender + bowel sounds  Musculoskeletal:  rrr Skin:  Right LE wrapped w/ kerlex covering skin tear    Recent Labs Lab 08/06/14 1817 08/07/14 0354  NA 128* 131*  K 4.1 3.8  CL 89* 97  CO2 22 21  BUN 34* 27*  CREATININE 0.87 0.76  GLUCOSE 112* 93    Recent Labs Lab 08/06/14 1817 08/07/14 0354  HGB 13.3 11.3*  HCT 38.8 33.3*  WBC 17.4* 14.0*  PLT 482* 532*   Ct Chest Wo Contrast  08/06/2014   CLINICAL DATA:  Cough. Shortness of breath. Abnormal chest x-ray. Initial encounter.  EXAM: CT CHEST WITHOUT CONTRAST  TECHNIQUE: Multidetector CT imaging of the chest was performed following the standard protocol without IV contrast.  COMPARISON:  Chest radiograph 08/06/2014.  FINDINGS: Evaluation is mildly degraded by respiratory motion artifact.  Musculoskeletal: No aggressive osseous lesions. Likely chronic T6 compression fracture. Loose body is present in the superior SUBSCAPULARIS recess of the RIGHT shoulder. RIGHT rotator cuff muscular  atrophy compatible with chronic tear. Severe RIGHT glenohumeral osteoarthritis. Posterior subluxation of the RIGHT humeral head.  Lungs: Atelectasis is present in the dependent portions of the LEFT lung. There is compressive atelectasis involving the RIGHT middle and RIGHT lower lobes. This is secondary to a moderate RIGHT pleural effusion. Most of the RIGHT lower lobe is collapse with a small portion of the RIGHT middle lobe collapsed. 7 mm pulmonary nodule present in the aerated RIGHT middle lobe (image 35 series 7).  There is a branching area of high density in the superior segment RIGHT lower lobe that suggests embolized bone cement from vertebral augmentation. This  could also represent confluent calcifications associated with old granulomatous disease.  Central airways: Extensive calcification of the tracheobronchial tree.  Vasculature: Atherosclerosis. Ascending aortic ectasia, measuring 37 mm. Tortuosity of the thoracic aorta. The heart is displaced to the LEFT. Extensive coronary artery atherosclerosis.  Effusions: Moderate loculated RIGHT pleural effusion at the lung base. This includes both anterior and posterior components and produces compressive atelectasis.  Lymphadenopathy: No axillary adenopathy. No gross mediastinal adenopathy. Hilar adenopathy not evaluated in the absence of contrast. Old granulomatous disease is present with calcified lymph nodes in the RIGHT hilum.  Esophagus: Normal.  Upper abdomen: Old granulomatous disease of the spleen.  Other: None.  IMPRESSION: 1. Moderate loculated RIGHT pleural effusion, likely producing shortness of breath. This produces substantial atelectasis of the RIGHT lower lobe and partial atelectasis of the RIGHT middle lobe. This could represent a parapneumonic effusion or malignant effusion. Ordinary effusion associated with CHF is also possible. 2. 7 mm RIGHT middle lobe pulmonary nodule. Although follow-up is typically recommended, this is at the discretion of the referring clinician in this patient of advanced age. Guideline recommendations as follows: If the patient is at high risk for bronchogenic carcinoma, follow-up chest CT at 3-35months is recommended. If the patient is at low risk for bronchogenic carcinoma, follow-up chest CT at 6-12 months is recommended. This recommendation follows the consensus statement: Guidelines for Management of Small Pulmonary Nodules Detected on CT Scans: A Statement from the [employer] as published in Radiology 2005; 237:395-400. 3. Severe atherosclerosis and coronary artery disease with ectasia of the ascending aorta. Again, follow-up is at the discretion of the referring  clinician. Guideline recommendations as follows: Ectatic to mildly aneurysmal ascending thoracic aorta. Recommend annual imaging followup by CTA or MRA. This recommendation follows 2010 ACCF/AHA/AATS/ACR/ASA/SCA/SCAI/SIR/STS/SVM Guidelines for the Diagnosis and Management of Patients With Thoracic Aortic Disease. Circulation. 2010; 121: P329-J188   Electronically Signed   By: Dereck Ligas M.D.   On: 08/06/2014 21:06   Dg Chest Port 1 View  08/06/2014   CLINICAL DATA:  Right-sided chest pain. Shortness of breath. Hypoxia.  EXAM: PORTABLE CHEST - 1 VIEW  COMPARISON:  09/29/2010  FINDINGS: New opacity is seen in the right mid and lower lung, which may be due to infiltrate or mass. Right pleural effusion cannot be excluded.  Left lung is clear.  Heart size is stable.  IMPRESSION: New ill-defined opacity in right mid and lower lung, which may be due to infiltrate or mass. Right pleural effusion cannot be excluded. Recommend continued chest radiographic follow-up or chest CT with contrast.   Electronically Signed   By: Earle Gell M.D.   On: 08/06/2014 18:15    ASSESSMENT / PLAN:  Loculated right pleural Pleural effusion  Possible HCAP  Pulmonary nodule.   Discussion  Underwent diagnostic thoracentesis at bedside. Fluid indeed appeared loculated and we were able to  get about 100 ml of concentrated pleural fluid. S/p thoracentesis she still had mod fluid collection on the right lateral chest. Unclear if this is a parapneumonic process or perhaps even r/t a malignancy.   Plan/rec Cont current abx We will f/u on thoracentesis labs Depending on labs we will need to decide on possibly repeat bedside thora vs CT guided drainage.   Erick Colace ACNP-BC [facility] Pager # [phone] OR # [phone] if no answer  NHR, treated as pneumonia, found to have rt effusion with aelectasis on CT, strangely unable to obtian more than 100 cc of fluid on thora ([place] does suggest more) - will await  dignostic results, if cytology positive for malignancy , can place pleurx, otherwise perform another therapeutic thoracentesis   Joby Richart V. MD  08/07/2014, 1:12 PM

## 2014-08-07 NOTE — Progress Notes (Signed)
   08/07/14 1323  PT Visit Information  Last PT Received On 08/07/14  Assistance Needed +1  History of Present Illness [name] is a 80 y.o. female adm with shortness of breath. past medical history of remote GI bleeding due to diverticulosis, hypertension, GERD, dementia   Pt assisted back to bed for procedure (thoracentesis)  PT Time Calculation  PT Start Time (ACUTE ONLY) 1155  PT Stop Time (ACUTE ONLY) 1204  PT Time Calculation (min) (ACUTE ONLY) 9 min  Subjective Data  Subjective pt resting in chair  Patient Stated Goal pt does not state but is agreeable to therapy  Precautions  Precautions Fall  Pain Assessment  Pain Assessment No/denies pain  Cognition  Overall Cognitive Status Impaired/Different from baseline  Difficult to assess due to Hard of hearing/deaf  Bed Mobility  General bed mobility comments pt left on EOB ( with RN present)for procedure (thoracentesis)  Transfers  Overall transfer level Needs assistance  Equipment used Rolling walker (2 wheeled)  Transfers Sit to/from [facility]  Sit to Stand Min assist  Stand pivot transfers Min assist  General transfer comment cues for hand placement and safety, control of descent  Ambulation/Gait  Ambulation/Gait assistance Min assist  Ambulation Distance (Feet) 4 Feet  Assistive device Rolling walker (2 wheeled)  Gait Pattern/deviations Step-to pattern;Trunk flexed  General Gait Details cues for sequence  Balance  Overall balance assessment Needs assistance  Standing balance-Leahy Scale Poor  PT - End of Session  Equipment Utilized During Treatment Gait belt  Activity Tolerance Patient tolerated treatment well  Patient left Other (comment);with call bell/phone within reach;with nursing/sitter in room  PT - Assessment/Plan  PT Plan Current plan remains appropriate  PT Frequency (ACUTE ONLY) Min 3X/week  Follow Up Recommendations SNF  PT equipment None recommended by PT  PT Goal Progression   Progress towards PT goals Progressing toward goals  Acute Rehab PT Goals  PT Goal Formulation With patient  Time For Goal Achievement 08/21/14  Potential to Achieve Goals Fair  PT General Charges  $$ ACUTE PT VISIT 1 Procedure  PT Treatments  $Therapeutic Activity 8-22 mins

## 2014-08-07 NOTE — Progress Notes (Signed)
UR complete 

## 2014-08-08 NOTE — Progress Notes (Signed)
PATIENT DETAILS Name: [name] Age: 80 y.o. Sex: female Date of Birth: 1916/12/24 Admit Date: 08/06/2014 Admitting Physician Ivor Costa, MD ERD:[id number],HUD THOMAS, MD  Subjective: No major complaints  Assessment/Plan: Principal Problem:   HCAP (healthcare-associated pneumonia):Improved. No further shortness of breath. Remains afebrile, with mild leukocytosis on CBC yesterday. Continue empiric Vanco/Cefepime. Urine Strep Antigen negative, Legionella Urine antigen pending. Blood culture neg as well.Obtain SLP eval.  Active Problems:   Pleural Effusion:exudative, likely para pneumonic. However appears to be loculated. Pleural fluid cultures neg, cytology pending. PCCM following.Remains on IV Abx as noted above.   Hypertension: Currently stable, continue amlodipine   Dementia: Currently stable, at baseline-mildly confused.   GERD (gastroesophageal reflux disease): Continue PPI   Protein calorie malnutrition: Continue nutritional supplements   Lung nodule: 7 mL right middle lobe pulmonary nodule, outpatient followup per PCPs discretion given advanced age   Disposition: Remain inpatient  Antibiotics:  See below   Anti-infectives    Start     Dose/Rate Route Frequency Ordered Stop   08/07/14 1800  vancomycin (VANCOCIN) IVPB 750 mg/150 ml premix     750 mg150 mL/hr over 60 Minutes Intravenous Every 24 hours 08/06/14 1947     08/07/14 1600  ceFEPIme (MAXIPIME) 1 g in dextrose 5 % 50 mL IVPB     1 g100 mL/hr over 30 Minutes Intravenous Every 24 hours 08/06/14 1947     08/06/14 2200  ceFEPIme (MAXIPIME) 1 g in dextrose 5 % 50 mL IVPB  Status:  Discontinued     1 g100 mL/hr over 30 Minutes Intravenous 3 times per day 08/06/14 2157 08/06/14 2159   08/06/14 1745  ceFEPIme (MAXIPIME) 2 g in dextrose 5 % 50 mL IVPB     2 g100 mL/hr over 30 Minutes Intravenous  Once 08/06/14 1744 08/06/14 1925   08/06/14 1745  vancomycin (VANCOCIN) IVPB 1000 mg/200 mL premix     1,000 mg200 mL/hr over 60 Minutes Intravenous  Once 08/06/14 1744 08/06/14 1925      DVT Prophylaxis: Prophylactic  Heparin  Code Status DNR  Family Communication None at bedside  Procedures:  None  CONSULTS:  pulmonary/intensive care  Time spent 40 minutes-which includes 50% of the time with face-to-face with patient/ family and coordinating care related to the above assessment and plan.  MEDICATIONS: Scheduled Meds: . acetaminophen  1,000 mg Oral TID WC & HS  . amLODipine  2.5 mg Oral Daily  . beta carotene w/minerals  1 tablet Oral BID  . calcium-vitamin D  1 tablet Oral BID WC  . ceFEPime (MAXIPIME) IV  1 g Intravenous Q24H  . feeding supplement (ENSURE COMPLETE)  237 mL Oral TID BM  . fluticasone  1 spray Each Nare Daily  . heparin  5,000 Units Subcutaneous 3 times per day  . loratadine  10 mg Oral Daily  . nystatin  5 mL Mouth/Throat QID  . pantoprazole  40 mg Oral Daily  . polyethylene glycol  17 g Oral QPM  . vancomycin  750 mg Intravenous Q24H  . vitamin B-12  1,000 mcg Oral Daily   Continuous Infusions:  PRN Meds:.albuterol, hydrocortisone, zolpidem    PHYSICAL EXAM: Vital signs in last 24 hours: Filed Vitals:   08/07/14 0701 08/07/14 1424 08/07/14 2122 08/08/14 0528  BP: 116/69 141/62 172/78 153/82  Pulse: 100 105 98 98  Temp: 98.4 F (36.9 C) 98.1 F (36.7 C) 98.7 F (37.1 C) 98.3 F (36.8 C)  TempSrc:  Oral Oral Oral Oral  Resp: 22 20 20 20   Height:      Weight:      SpO2: 98% 95% 98% 97%    Weight change:  Filed Weights   08/06/14 1822  Weight: 43.545 kg (96 lb)   Body mass index is 17.01 kg/(m^2).   Gen Exam: Awake and alert-but mildly confused, has clear speech.   Neck: Supple, No JVD.   Chest: B/L Clear-but with decreased air entry at Right base area CVS: S1 S2 Regular, no murmurs. Abdomen: soft, BS +, non tender, non distended.  Extremities: no edema, lower extremities warm to touch. Neurologic: Non Focal.   Skin: No Rash.    Wounds: N/A.    Intake/Output from previous day: No intake or output data in the 24 hours ending 08/08/14 1310   LAB RESULTS: CBC  Recent Labs Lab 08/06/14 1817 08/07/14 0354  WBC 17.4* 14.0*  HGB 13.3 11.3*  HCT 38.8 33.3*  PLT 482* 532*  MCV 89.6 88.6  MCH 30.7 30.1  MCHC 34.3 33.9  RDW 13.4 13.5  LYMPHSABS 1.9 2.2  MONOABS 1.1* 1.0  EOSABS 0.0 0.0  BASOSABS 0.0 0.0    Chemistries   Recent Labs Lab 08/06/14 1817 08/07/14 0354  NA 128* 131*  K 4.1 3.8  CL 89* 97  CO2 22 21  GLUCOSE 112* 93  BUN 34* 27*  CREATININE 0.87 0.76  CALCIUM 9.4 9.0    CBG: No results for input(s): GLUCAP in the last 168 hours.  GFR Estimated Creatinine Clearance: 29.5 mL/min (by C-G formula based on Cr of 0.76).  Coagulation profile  Recent Labs Lab 08/06/14 1817  INR 1.00    Cardiac Enzymes  Recent Labs Lab 08/06/14 2232 08/07/14 0354 08/07/14 0958  TROPONINI <0.30 <0.30 <0.30    Invalid input(s): POCBNP No results for input(s): DDIMER in the last 72 hours. No results for input(s): HGBA1C in the last 72 hours. No results for input(s): CHOL, HDL, LDLCALC, TRIG, CHOLHDL, LDLDIRECT in the last 72 hours. No results for input(s): TSH, T4TOTAL, T3FREE, THYROIDAB in the last 72 hours.  Invalid input(s): FREET3 No results for input(s): VITAMINB12, FOLATE, FERRITIN, TIBC, IRON, RETICCTPCT in the last 72 hours. No results for input(s): LIPASE, AMYLASE in the last 72 hours.  Urine Studies No results for input(s): UHGB, CRYS in the last 72 hours.  Invalid input(s): UACOL, UAPR, USPG, UPH, UTP, UGL, UKET, UBIL, UNIT, UROB, ULEU, UEPI, UWBC, URBC, UBAC, CAST, UCOM, BILUA  MICROBIOLOGY: Recent Results (from the past 240 hour(s))  Blood Culture (routine x 2)     Status: None (Preliminary result)   Collection Time: 08/06/14  6:13 PM  Result Value Ref Range Status   Specimen Description BLOOD RIGHT ANTECUBITAL  Final   Special Requests BOTTLES DRAWN AEROBIC AND  ANAEROBIC 5ML  Final   Culture  Setup Time   Final    08/06/2014 23:02 Performed at [facility]    Culture   Final           BLOOD CULTURE RECEIVED NO GROWTH TO DATE CULTURE WILL BE HELD FOR 5 DAYS BEFORE ISSUING A FINAL NEGATIVE REPORT Performed at [facility]    Report Status PENDING  Incomplete  Blood Culture (routine x 2)     Status: None (Preliminary result)   Collection Time: 08/06/14  6:34 PM  Result Value Ref Range Status   Specimen Description BLOOD RAC  Final   Special Requests BOTTLES DRAWN AEROBIC AND ANAEROBIC 5ML  Final   Culture  Setup Time   Final    08/06/2014 23:03 Performed at [facility]    Culture   Final           BLOOD CULTURE RECEIVED NO GROWTH TO DATE CULTURE WILL BE HELD FOR 5 DAYS BEFORE ISSUING A FINAL NEGATIVE REPORT Performed at [facility]    Report Status PENDING  Incomplete  Urine culture     Status: None   Collection Time: 08/06/14  7:04 PM  Result Value Ref Range Status   Specimen Description URINE, CATHETERIZED  Final   Special Requests NONE  Final   Culture  Setup Time   Final    08/07/2014 01:14 Performed at [place] Performed at [facility]   Final   Culture NO GROWTH Performed at [facility]   Final   Report Status 08/07/2014 FINAL  Final  Body fluid culture     Status: None (Preliminary result)   Collection Time: 08/07/14 12:34 PM  Result Value Ref Range Status   Specimen Description PLEURAL RIGHT  Final   Special Requests NONE  Final   Gram Stain   Final    RARE WBC PRESENT, PREDOMINANTLY MONONUCLEAR NO ORGANISMS SEEN Performed at [facility]    Culture NO GROWTH Performed at [facility]   Final   Report Status PENDING  Incomplete    RADIOLOGY STUDIES/RESULTS: Ct Chest Wo Contrast  08/06/2014   CLINICAL DATA:  Cough. Shortness of breath. Abnormal chest x-ray. Initial encounter.  EXAM: CT CHEST WITHOUT  CONTRAST  TECHNIQUE: Multidetector CT imaging of the chest was performed following the standard protocol without IV contrast.  COMPARISON:  Chest radiograph 08/06/2014.  FINDINGS: Evaluation is mildly degraded by respiratory motion artifact.  Musculoskeletal: No aggressive osseous lesions. Likely chronic T6 compression fracture. Loose body is present in the superior SUBSCAPULARIS recess of the RIGHT shoulder. RIGHT rotator cuff muscular atrophy compatible with chronic tear. Severe RIGHT glenohumeral osteoarthritis. Posterior subluxation of the RIGHT humeral head.  Lungs: Atelectasis is present in the dependent portions of the LEFT lung. There is compressive atelectasis involving the RIGHT middle and RIGHT lower lobes. This is secondary to a moderate RIGHT pleural effusion. Most of the RIGHT lower lobe is collapse with a small portion of the RIGHT middle lobe collapsed. 7 mm pulmonary nodule present in the aerated RIGHT middle lobe (image 35 series 7).  There is a branching area of high density in the superior segment RIGHT lower lobe that suggests embolized bone cement from vertebral augmentation. This could also represent confluent calcifications associated with old granulomatous disease.  Central airways: Extensive calcification of the tracheobronchial tree.  Vasculature: Atherosclerosis. Ascending aortic ectasia, measuring 37 mm. Tortuosity of the thoracic aorta. The heart is displaced to the LEFT. Extensive coronary artery atherosclerosis.  Effusions: Moderate loculated RIGHT pleural effusion at the lung base. This includes both anterior and posterior components and produces compressive atelectasis.  Lymphadenopathy: No axillary adenopathy. No gross mediastinal adenopathy. Hilar adenopathy not evaluated in the absence of contrast. Old granulomatous disease is present with calcified lymph nodes in the RIGHT hilum.  Esophagus: Normal.  Upper abdomen: Old granulomatous disease of the spleen.  Other: None.   IMPRESSION: 1. Moderate loculated RIGHT pleural effusion, likely producing shortness of breath. This produces substantial atelectasis of the RIGHT lower lobe and partial atelectasis of the RIGHT middle lobe. This could represent a parapneumonic effusion or malignant effusion.  Ordinary effusion associated with CHF is also possible. 2. 7 mm RIGHT middle lobe pulmonary nodule. Although follow-up is typically recommended, this is at the discretion of the referring clinician in this patient of advanced age. Guideline recommendations as follows: If the patient is at high risk for bronchogenic carcinoma, follow-up chest CT at 3-5months is recommended. If the patient is at low risk for bronchogenic carcinoma, follow-up chest CT at 6-12 months is recommended. This recommendation follows the consensus statement: Guidelines for Management of Small Pulmonary Nodules Detected on CT Scans: A Statement from the [employer] as published in Radiology 2005; 237:395-400. 3. Severe atherosclerosis and coronary artery disease with ectasia of the ascending aorta. Again, follow-up is at the discretion of the referring clinician. Guideline recommendations as follows: Ectatic to mildly aneurysmal ascending thoracic aorta. Recommend annual imaging followup by CTA or MRA. This recommendation follows 2010 ACCF/AHA/AATS/ACR/ASA/SCA/SCAI/SIR/STS/SVM Guidelines for the Diagnosis and Management of Patients With Thoracic Aortic Disease. Circulation. 2010; 121: G500-B704   Electronically Signed   By: Dereck Ligas M.D.   On: 08/06/2014 21:06   Dg Chest Port 1 View  08/07/2014   CLINICAL DATA:  80 year old female status post right thoracentesis. Initial encounter.  EXAM: PORTABLE CHEST - 1 VIEW  COMPARISON:  Chest CT without contrast 08/06/2014 and earlier.  FINDINGS: Portable AP semi upright view at 1237 hrs. No pneumothorax identified. Mildly improved lung volumes and mildly regressed opacity at the right lung base. Stable cardiac size  and mediastinal contours. Extensive calcified atherosclerosis of the aorta. No new pulmonary opacity identified. Visualized tracheal air column is within normal limits.  IMPRESSION: No pneumothorax identified following right side thoracentesis. Mildly improved ventilation.   Electronically Signed   By: Lars Pinks M.D.   On: 08/07/2014 13:17   Dg Chest Port 1 View  08/06/2014   CLINICAL DATA:  Right-sided chest pain. Shortness of breath. Hypoxia.  EXAM: PORTABLE CHEST - 1 VIEW  COMPARISON:  09/29/2010  FINDINGS: New opacity is seen in the right mid and lower lung, which may be due to infiltrate or mass. Right pleural effusion cannot be excluded.  Left lung is clear.  Heart size is stable.  IMPRESSION: New ill-defined opacity in right mid and lower lung, which may be due to infiltrate or mass. Right pleural effusion cannot be excluded. Recommend continued chest radiographic follow-up or chest CT with contrast.   Electronically Signed   By: Earle Gell M.D.   On: 08/06/2014 18:15    Oren Binet, MD  [facility] Pager:[phone]  If 7PM-7AM, please contact night-coverage [email or web address] Password [email or web address] 08/08/2014, 1:10 PM   LOS: 2 days

## 2014-08-09 LAB — BASIC METABOLIC PANEL
Anion gap: 15 (ref 5–15)
BUN: 16 mg/dL (ref 6–23)
CO2: 21 mEq/L (ref 19–32)
Calcium: 9.1 mg/dL (ref 8.4–10.5)
Chloride: 99 mEq/L (ref 96–112)
Creatinine, Ser: 0.73 mg/dL (ref 0.50–1.10)
GFR, EST AFRICAN AMERICAN: 82 mL/min — AB (ref >90)
GFR, EST NON AFRICAN AMERICAN: 71 mL/min — AB (ref >90)
Glucose, Bld: 103 mg/dL — ABNORMAL HIGH (ref 70–99)
POTASSIUM: 3.8 meq/L (ref 3.7–5.3)
Sodium: 135 mEq/L — ABNORMAL LOW (ref 137–147)

## 2014-08-09 LAB — FUNGAL STAIN: FUNGAL SMEAR: NONE SEEN

## 2014-08-09 LAB — VANCOMYCIN, TROUGH: VANCOMYCIN TR: 8.9 ug/mL — AB (ref 10.0–20.0)

## 2014-08-09 MED ORDER — VANCOMYCIN HCL 500 MG IV SOLR
500.0000 mg | Freq: Two times a day (BID) | INTRAVENOUS | Status: DC
  Administered 2014-08-09 – 2014-08-12 (×6): 500 mg via INTRAVENOUS
  Filled 2014-08-09 (×7): qty 500

## 2014-08-09 NOTE — Progress Notes (Addendum)
ANTIBIOTIC CONSULT NOTE - Follow up  Pharmacy Consult for vancomycin/Cefepime Indication: rule out pneumonia  Allergies  Allergen Reactions  . Celebrex [Celecoxib] Itching  . Morphine And Related Nausea Only  . Vicodin [Hydrocodone-Acetaminophen] Itching  . Sulfa Antibiotics Rash    Patient Measurements: Height: 5\' 3"  (160 cm) Weight: 96 lb (43.545 kg) IBW/kg (Calculated) : 52.4 Adjusted Body Weight:   Vital Signs: Temp: 98.5 F (36.9 C) (12/13 0500) Temp Source: Oral (12/13 0500) BP: 162/70 mmHg (12/13 0500) Pulse Rate: 96 (12/13 0500) Intake/Output from previous day: 12/12 0701 - 12/13 0700 In: 360 [P.O.:360] Out: -  Intake/Output from this shift: Total I/O In: 200 [P.O.:200] Out: 200 [Urine:200]  Labs:  Recent Labs  08/06/14 1817 08/07/14 0354  WBC 17.4* 14.0*  HGB 13.3 11.3*  PLT 482* 532*  CREATININE 0.87 0.76   Estimated Creatinine Clearance: 29.5 mL/min (by C-G formula based on Cr of 0.76). No results for input(s): VANCOTROUGH, VANCOPEAK, VANCORANDOM, GENTTROUGH, GENTPEAK, GENTRANDOM, TOBRATROUGH, TOBRAPEAK, TOBRARND, AMIKACINPEAK, AMIKACINTROU, AMIKACIN in the last 72 hours.   Microbiology: Recent Results (from the past 720 hour(s))  Blood Culture (routine x 2)     Status: None (Preliminary result)   Collection Time: 08/06/14  6:13 PM  Result Value Ref Range Status   Specimen Description BLOOD RIGHT ANTECUBITAL  Final   Special Requests BOTTLES DRAWN AEROBIC AND ANAEROBIC 5ML  Final   Culture  Setup Time   Final    08/06/2014 23:02 Performed at [facility]    Culture   Final           BLOOD CULTURE RECEIVED NO GROWTH TO DATE CULTURE WILL BE HELD FOR 5 DAYS BEFORE ISSUING A FINAL NEGATIVE REPORT Performed at [facility]    Report Status PENDING  Incomplete  Blood Culture (routine x 2)     Status: None (Preliminary result)   Collection Time: 08/06/14  6:34 PM  Result Value Ref Range Status   Specimen Description BLOOD RAC   Final   Special Requests BOTTLES DRAWN AEROBIC AND ANAEROBIC 5ML  Final   Culture  Setup Time   Final    08/06/2014 23:03 Performed at [facility]    Culture   Final           BLOOD CULTURE RECEIVED NO GROWTH TO DATE CULTURE WILL BE HELD FOR 5 DAYS BEFORE ISSUING A FINAL NEGATIVE REPORT Performed at [facility]    Report Status PENDING  Incomplete  Urine culture     Status: None   Collection Time: 08/06/14  7:04 PM  Result Value Ref Range Status   Specimen Description URINE, CATHETERIZED  Final   Special Requests NONE  Final   Culture  Setup Time   Final    08/07/2014 01:14 Performed at [place] Performed at [facility]   Final   Culture NO GROWTH Performed at [facility]   Final   Report Status 08/07/2014 FINAL  Final  AFB culture with smear     Status: None (Preliminary result)   Collection Time: 08/07/14 12:34 PM  Result Value Ref Range Status   Specimen Description PLEURAL RIGHT  Final   Special Requests NONE  Final   Acid Fast Smear   Final    NO ACID FAST BACILLI SEEN Performed at [facility]    Culture   Final    CULTURE WILL BE EXAMINED FOR 6 WEEKS BEFORE ISSUING A  FINAL REPORT Performed at [facility]    Report Status PENDING  Incomplete  Body fluid culture     Status: None (Preliminary result)   Collection Time: 08/07/14 12:34 PM  Result Value Ref Range Status   Specimen Description PLEURAL RIGHT  Final   Special Requests NONE  Final   Gram Stain   Final    RARE WBC PRESENT, PREDOMINANTLY MONONUCLEAR NO ORGANISMS SEEN Performed at [facility]    Culture NO GROWTH Performed at [facility]   Final   Report Status PENDING  Incomplete  Fungal stain     Status: None   Collection Time: 08/07/14 12:34 PM  Result Value Ref Range Status   Specimen Description PLEURAL RIGHT  Final   Special Requests NONE  Final   Fungal Smear   Final    NO YEAST OR  FUNGAL ELEMENTS SEEN Performed at [facility]    Report Status 08/09/2014 FINAL  Final    Assessment: 42 YOF presents with shortness of breath, starting broad spectrum antibiotics for HCAP. She was on levofloxacin at [facility] for pneumonia, sent to ED for concern of PE per [facility] note (CTA ordered). 12/7 CXR revealed RLL opacity - PNA vs Mass, 12/10 CXR reveals same   12/10 >> vancomycin >> 12/10 >> cefepime >>   Tmax: AF WBCs: elevated, but trending down, 14 Renal: SCr WNL, CrCl ~ 30 mL/min CG, 50 mL/min N  12/10 blood x 2: ngtd 12/10 urine: neg 12/10 sputum: ordered 12/11 pleural fluid: ngtd, no yeast.  Goal of Therapy:  Vancomycin trough level 15-20 mcg/ml  Appropriate antibiotic dosing for renal function; eradication of infection  Plan:  Cont Vanc 750mg  IV q24h. Cefepime 1g IV q24h. Check a Vanc trough and SCr at 18:00 today. Follow up renal fxn and culture results.  Romeo Rabon, PharmD, pager [phone]. 08/09/2014,1:11 PM.  Addendum:   SCr remains wnl. Vanc trough = 8.9 mg/L on 750mg  q24h. This is lower than expected. There is a chance she has not reached steady-state yet but I wouldn't expect enough accumulation to reach the target range of 15-20 mg/L.   Plan: Change Vanc to 500mg  IV q12h and recheck in 2-3 days if remains on Vanc.   Romeo Rabon, PharmD, pager [phone]. 08/09/2014,5:58 PM.

## 2014-08-09 NOTE — Evaluation (Signed)
Clinical/Bedside Swallow Evaluation Patient Details  Name: [name] MRN: [id number] Date of Birth: 02/03/1917  Today's Date: 08/09/2014 Time: 1010-1040 SLP Time Calculation (min) (ACUTE ONLY): 30 min  Past Medical History:  Past Medical History  Diagnosis Date  . Arthritis   . Carpal tunnel syndrome on left   . DJD (degenerative joint disease)   . Diverticulosis   . Macular degeneration syndrome   . GERD (gastroesophageal reflux disease)   . Headache(784.0)   . Wears glasses   . Wears hearing aid     both  . Wears partial dentures     top and bottom  . High blood pressure     echo 09-normal  . Lower GI bleed 06/2013    required 2 units PRBC   Past Surgical History:  Past Surgical History  Procedure Laterality Date  . Appendectomy    . Hip surgery      left  . Knee surgery    . Tonsillectomy    . Thyroid cyst excision    . Eye surgery      cataractas  . Carpal tunnel release  2/12    rt  . Joint replacement  2006    rt total hip x2  . Carpal tunnel release  04/23/2012    Procedure: CARPAL TUNNEL RELEASE;  Surgeon: Wynonia Sours, MD;  Location: [place];  Service: Orthopedics;  Laterality: Left;   HPI:  80 yo female adm to [facility] diagnosed with HCAP. She resides at [facility]   Pt with Pleural Effusion, HTN, GERD, depression, malnutrition, right lung nodule.  Swallow evaluation ordered.     Assessment / Plan / Recommendation Clinical Impression  Pt presents with functional oropharyngeal swallow without s/s of aspiration.  CN exam unremarkable.  Pt does admit to large pill dysphagia and she takes her medicines with applesauce at [facility].  She naturally eats slowly which maximizes her airway safety.  Educated pt and caregiver Levada Dy to aspiration precautions especially given pt in hospital with pna.  Will sign off.      Aspiration Risk  Mild    Diet Recommendation Regular;Thin liquid   Liquid Administration via: Straw;Cup Medication  Administration: Whole meds with puree Supervision: Patient able to self feed Compensations: Slow rate;Small sips/bites Postural Changes and/or Swallow Maneuvers: Seated upright 90 degrees;Upright 30-60 min after meal    Other  Recommendations     Follow Up Recommendations  None    Frequency and Duration        Pertinent Vitals/Pain Afebrile, decreased      Swallow Study Prior Functional Status   See [place]     General Date of Onset: 08/09/14 HPI: 80 yo female adm [facility] diagnosed with HCAP. She resides at [facility]   Pt with Pleural Effusion, HTN, GERD, depression, malnutrition, right lung nodule.  Swallow evaluation ordered.   Type of Study: Bedside swallow evaluation Temperature Spikes Noted: No Respiratory Status: Nasal cannula (3 liters) History of Recent Intubation: No Behavior/Cognition: Alert;Cooperative;Pleasant mood Oral Cavity - Dentition: Dentures, bottom;Dentures, top Self-Feeding Abilities: Able to feed self Patient Positioning: Upright in bed Baseline Vocal Quality: Clear Volitional Cough: Strong Volitional Swallow: Unable to elicit (xerostomia)    Oral/Motor/Sensory Function Overall Oral Motor/Sensory Function: Appears within functional limits for tasks assessed   Ice Chips Ice chips: Not tested   Thin Liquid Thin Liquid: Within functional limits Presentation: Straw    Nectar Thick Nectar Thick Liquid: Not tested   Honey Thick Honey Thick Liquid: Not tested  Puree Puree: Not tested   Solid   GO    Solid: Within functional limits Presentation: Self Fed Other Comments: slow mastication=effective       Luanna Salk, [place] [facility] SLP [phone]

## 2014-08-09 NOTE — Progress Notes (Signed)
TRH Progress Note       PATIENT DETAILS Name: [name] Age: 80 y.o. Sex: female Date of Birth: February 28, 1917 Admit Date: 08/06/2014 Admitting Physician Ivor Costa, MD FXT:[id number] Marcello Moores, MD  HPI 80 y.o. female with past medical history of remote GI bleeding due to diverticulosis, hypertension, GERD, dementia, who presents with shortness of breath found to have HCAP and a pleural effusion.  Subjective: No complaints, breathing better this morning  Assessment/Plan:  HCAP (healthcare-associated pneumonia):Improved. No further shortness of breath. Remains afebrile, with mild leukocytosis.  - Continue empiric Vanco/Cefepime.  - Urine Strep Antigen negative, Legionella Urine antigen pending.  - Blood culture neg as well.Obtain SLP eval.  Pleural Effusion: exudative, likely para pneumonic. However appears to be loculated. Pleural fluid cultures neg, cytology pending. Remains on IV Abx as noted above. - pulmonology following.   Hypertension: Currently stable, continue amlodipine  Dementia: Currently stable, at baseline-mildly confused.  GERD (gastroesophageal reflux disease): Continue PPI  Protein calorie malnutrition: Continue nutritional supplements  Lung nodule: 7 mL right middle lobe pulmonary nodule, outpatient followup per PCPs discretion given advanced age    Antibiotics: Vancomycin 12/10 >> Cefepime 12/10 >>  DVT Prophylaxis: Prophylactic  Heparin Code Status DNR Disposition: Remain inpatient Family Communication at patient's preference d/w caregiver bedside  Procedures:  Thoracentesis 12/11  CONSULTS:  pulmonary/intensive care  PHYSICAL EXAM: Vital signs in last 24 hours: Filed Vitals:   08/08/14 0528 08/08/14 1315 08/08/14 2130 08/09/14 0500  BP: 153/82 128/72 182/69 162/70  Pulse: 98 104 67 96  Temp: 98.3 F (36.8 C) 98.7 F (37.1 C) 98.2 F (36.8 C) 98.5 F (36.9 C)  TempSrc: Oral Oral Oral Oral  Resp: 20 18 18 20   Height:        Weight:      SpO2: 97% 94% 98% 99%    Weight change:  Filed Weights   08/06/14 1822  Weight: 43.545 kg (96 lb)   Body mass index is 17.01 kg/(m^2).   Gen Exam: Awake and alert-but mildly confused, has clear speech.   Neck: Supple, No JVD.   Chest: B/L Clear-but with decreased air entry at Right base area CVS: S1 S2 Regular, no murmurs. Abdomen: soft, BS +, non tender, non distended.  Extremities: no edema, lower extremities warm to touch. Neurologic: Non Focal.   Skin: No Rash.   Wounds: N/A.    Intake/Output from previous day:  Intake/Output Summary (Last 24 hours) at 08/09/14 1401 Last data filed at 08/09/14 0900  Gross per 24 hour  Intake    200 ml  Output    200 ml  Net      0 ml     LAB RESULTS: CBC  Recent Labs Lab 08/06/14 1817 08/07/14 0354  WBC 17.4* 14.0*  HGB 13.3 11.3*  HCT 38.8 33.3*  PLT 482* 532*  MCV 89.6 88.6  MCH 30.7 30.1  MCHC 34.3 33.9  RDW 13.4 13.5  LYMPHSABS 1.9 2.2  MONOABS 1.1* 1.0  EOSABS 0.0 0.0  BASOSABS 0.0 0.0    Chemistries   Recent Labs Lab 08/06/14 1817 08/07/14 0354  NA 128* 131*  K 4.1 3.8  CL 89* 97  CO2 22 21  GLUCOSE 112* 93  BUN 34* 27*  CREATININE 0.87 0.76  CALCIUM 9.4 9.0   GFR Estimated Creatinine Clearance: 29.5 mL/min (by C-G formula based on Cr of 0.76).  Coagulation profile  Recent Labs Lab 08/06/14 1817  INR 1.00    Cardiac Enzymes  Recent Labs  Lab 08/06/14 2232 08/07/14 0354 08/07/14 0958  TROPONINI <0.30 <0.30 <0.30   MICROBIOLOGY: Recent Results (from the past 240 hour(s))  Blood Culture (routine x 2)     Status: None (Preliminary result)   Collection Time: 08/06/14  6:13 PM  Result Value Ref Range Status   Specimen Description BLOOD RIGHT ANTECUBITAL  Final   Special Requests BOTTLES DRAWN AEROBIC AND ANAEROBIC 5ML  Final   Culture  Setup Time   Final    08/06/2014 23:02 Performed at [facility]    Culture   Final           BLOOD CULTURE RECEIVED NO  GROWTH TO DATE CULTURE WILL BE HELD FOR 5 DAYS BEFORE ISSUING A FINAL NEGATIVE REPORT Performed at [facility]    Report Status PENDING  Incomplete  Blood Culture (routine x 2)     Status: None (Preliminary result)   Collection Time: 08/06/14  6:34 PM  Result Value Ref Range Status   Specimen Description BLOOD RAC  Final   Special Requests BOTTLES DRAWN AEROBIC AND ANAEROBIC 5ML  Final   Culture  Setup Time   Final    08/06/2014 23:03 Performed at [facility]    Culture   Final           BLOOD CULTURE RECEIVED NO GROWTH TO DATE CULTURE WILL BE HELD FOR 5 DAYS BEFORE ISSUING A FINAL NEGATIVE REPORT Performed at [facility]    Report Status PENDING  Incomplete  Urine culture     Status: None   Collection Time: 08/06/14  7:04 PM  Result Value Ref Range Status   Specimen Description URINE, CATHETERIZED  Final   Special Requests NONE  Final   Culture  Setup Time   Final    08/07/2014 01:14 Performed at [place] Performed at [facility]   Final   Culture NO GROWTH Performed at [facility]   Final   Report Status 08/07/2014 FINAL  Final  AFB culture with smear     Status: None (Preliminary result)   Collection Time: 08/07/14 12:34 PM  Result Value Ref Range Status   Specimen Description PLEURAL RIGHT  Final   Special Requests NONE  Final   Acid Fast Smear   Final    NO ACID FAST BACILLI SEEN Performed at [facility]    Culture   Final    CULTURE WILL BE EXAMINED FOR 6 WEEKS BEFORE ISSUING A FINAL REPORT Performed at [facility]    Report Status PENDING  Incomplete  Body fluid culture     Status: None (Preliminary result)   Collection Time: 08/07/14 12:34 PM  Result Value Ref Range Status   Specimen Description PLEURAL RIGHT  Final   Special Requests NONE  Final   Gram Stain   Final    RARE WBC PRESENT, PREDOMINANTLY MONONUCLEAR NO ORGANISMS SEEN Performed at [facility]    Culture NO GROWTH Performed at [facility]   Final   Report Status PENDING  Incomplete  Fungal stain     Status: None   Collection Time: 08/07/14 12:34 PM  Result Value Ref Range Status   Specimen Description PLEURAL RIGHT  Final   Special Requests NONE  Final   Fungal Smear   Final    NO YEAST OR FUNGAL ELEMENTS SEEN Performed at [facility]    Report Status 08/09/2014 FINAL  Final  RADIOLOGY STUDIES/RESULTS: Ct Chest Wo Contrast 08/06/2014 1. Moderate loculated RIGHT pleural effusion, likely producing shortness of breath. This produces substantial atelectasis of the RIGHT lower lobe and partial atelectasis of the RIGHT middle lobe. This could represent a parapneumonic effusion or malignant effusion. Ordinary effusion associated with CHF is also possible. 2. 7 mm RIGHT middle lobe pulmonary nodule. Although follow-up is typically recommended, this is at the discretion of the referring clinician in this patient of advanced age. Guideline recommendations as follows: If the patient is at high risk for bronchogenic carcinoma, follow-up chest CT at 3-82months is recommended. If the patient is at low risk for bronchogenic carcinoma, follow-up chest CT at 6-12 months is recommended. This recommendation follows the consensus statement: Guidelines for Management of Small Pulmonary Nodules Detected on CT Scans: A Statement from the [place] as published in Radiology 2005; 237:395-400. 3. Severe atherosclerosis and coronary artery disease with ectasia of the ascending aorta. Again, follow-up is at the discretion of the referring clinician. Guideline recommendations as follows: Ectatic to mildly aneurysmal ascending thoracic aorta. Recommend annual imaging followup by CTA or MRA. This recommendation follows 2010 ACCF/AHA/AATS/ACR/ASA/SCA/SCAI/SIR/STS/SVM Guidelines for the Diagnosis and Management of Patients With Thoracic Aortic Disease. Circulation. 2010; 121:  Y403-K742   Electronically Signed   By: Dereck Ligas M.D.   On: 08/06/2014 21:06   Dg Chest Port 1 View 08/07/2014 No pneumothorax identified following right side thoracentesis. Mildly improved ventilation.   Electronically Signed   By: Lars Pinks M.D.   On: 08/07/2014 13:17   Dg Chest Port 1 View 08/06/2014 New ill-defined opacity in right mid and lower lung, which may be due to infiltrate or mass. Right pleural effusion cannot be excluded. Recommend continued chest radiographic follow-up or chest CT with contrast.   Electronically Signed   By: Earle Gell M.D.   On: 08/06/2014 18:15    Scheduled Meds: . acetaminophen  1,000 mg Oral TID WC & HS  . amLODipine  2.5 mg Oral Daily  . beta carotene w/minerals  1 tablet Oral BID  . calcium-vitamin D  1 tablet Oral BID WC  . ceFEPime (MAXIPIME) IV  1 g Intravenous Q24H  . feeding supplement (ENSURE COMPLETE)  237 mL Oral TID BM  . fluticasone  1 spray Each Nare Daily  . heparin  5,000 Units Subcutaneous 3 times per day  . loratadine  10 mg Oral Daily  . nystatin  5 mL Mouth/Throat QID  . pantoprazole  40 mg Oral Daily  . polyethylene glycol  17 g Oral QPM  . vancomycin  750 mg Intravenous Q24H  . vitamin B-12  1,000 mcg Oral Daily   Continuous Infusions:  PRN Meds:.albuterol, hydrocortisone, zolpidem   Time spent 25 minutes  Marzetta Board, MD  [facility] Pager:[phone]  If 7PM-7AM, please contact night-coverage [email or web address] Password [email or web address] 08/09/2014, 2:01 PM   LOS: 3 days

## 2014-08-10 ENCOUNTER — Inpatient Hospital Stay (HOSPITAL_COMMUNITY): Payer: Medicare Other

## 2014-08-10 LAB — LACTATE DEHYDROGENASE, PLEURAL OR PERITONEAL FLUID: LD, Fluid: 931 U/L — ABNORMAL HIGH (ref 3–23)

## 2014-08-10 LAB — BASIC METABOLIC PANEL
ANION GAP: 11 (ref 5–15)
BUN: 14 mg/dL (ref 6–23)
CALCIUM: 9.2 mg/dL (ref 8.4–10.5)
CO2: 23 mEq/L (ref 19–32)
CREATININE: 0.64 mg/dL (ref 0.50–1.10)
Chloride: 103 mEq/L (ref 96–112)
GFR calc Af Amer: 86 mL/min — ABNORMAL LOW (ref >90)
GFR, EST NON AFRICAN AMERICAN: 74 mL/min — AB (ref >90)
Glucose, Bld: 96 mg/dL (ref 70–99)
Potassium: 3.9 mEq/L (ref 3.7–5.3)
Sodium: 137 mEq/L (ref 137–147)

## 2014-08-10 LAB — GLUCOSE, SEROUS FLUID: Glucose, Fluid: 40 mg/dL

## 2014-08-10 LAB — BODY FLUID CELL COUNT WITH DIFFERENTIAL
Lymphs, Fluid: 7 %
Monocyte-Macrophage-Serous Fluid: 2 % — ABNORMAL LOW (ref 50–90)
NEUTROPHIL FLUID: 91 % — AB (ref 0–25)
WBC FLUID: 415 uL (ref 0–1000)

## 2014-08-10 LAB — PH, BODY FLUID: pH, Fluid: 7.5

## 2014-08-10 NOTE — Procedures (Signed)
Successful US guided right thoracentesis. Yielded 79ml of clear serous fluid. Pt tolerated procedure well. No immediate complications. Only minimal amount of fluid was able to be obtained secondary to severely loculated effusion.   Specimen was sent for labs. CXR ordered.  Tsosie Billing D PA-C 08/10/2014 10:40 AM

## 2014-08-10 NOTE — Progress Notes (Signed)
Clinical Social Work  Patient was discussed during progression meeting and MD reports patient is not medically stable to DC today. CSW updated SNF of DC plans and SNF can accept whenever medically stable.  [place], [place] [phone]

## 2014-08-10 NOTE — Progress Notes (Signed)
PT Cancellation Note  Patient Details Name: [name] MRN: [id number] DOB: [birth year]   Cancelled Treatment:    Reason Eval/Treat Not Completed: Fatigue/lethargy limiting ability to participate   [facility] 08/10/2014, 1:38 PM

## 2014-08-10 NOTE — Progress Notes (Signed)
INITIAL NUTRITION ASSESSMENT  DOCUMENTATION CODES Per approved criteria  -Severe malnutrition in the context of chronic illness  Pt meets criteria for severe MALNUTRITION in the context of chronic illness as evidenced by severe muscle and fat depletion.  INTERVENTION: -Continue Ensure Complete po TID, each supplement provides 350 kcal and 13 grams of protein -Encourage PO intake -RD to continue to monitor  NUTRITION DIAGNOSIS: Increased nutrient needs related to underweight status as evidenced by BMI of 17.   Goal: Pt to meet >/= 90% of their estimated nutrition needs   Monitor:  PO and supplemental intake, weight, labs, I/O's  Reason for Assessment: Low BMI  Admitting Dx: HCAP (healthcare-associated pneumonia)  ASSESSMENT: 80 y.o. female with past medical history of remote GI bleeding due to diverticulosis, hypertension, GERD, dementia, who presents with shortness of breath found to have HCAP and a pleural effusion.  12/14 Thoracentesis yielded 50 ml of fluid  Pt reports fair appetite, PO intake 65%. Pt ate tuna salad for lunch today and drank all of her Ensure supplement. States she likes the Ensures. Per weight history documentation, pt's weight has remained around 96-97 lb the past year.  Nutrition Focused Physical Exam:  Subcutaneous Fat:  Orbital Region: mild depletion Upper Arm Region: severe depletion Thoracic and Lumbar Region: NA  Muscle:  Temple Region: severe depletion Clavicle Bone Region: severe depletion Clavicle and Acromion Bone Region: severe depletion Scapular Bone Region: severe depletion Dorsal Hand: severe depletion Patellar Region: NA Anterior Thigh Region: NA Posterior Calf Region: NA  Edema: no LE edema  Labs reviewed: WNL  Height: Ht Readings from Last 1 Encounters:  08/06/14 5\' 3"  (1.6 m)    Weight: Wt Readings from Last 1 Encounters:  08/06/14 96 lb (43.545 kg)    Ideal Body Weight: 115 lb  % Ideal Body Weight: 83%  Wt  Readings from Last 10 Encounters:  08/06/14 96 lb (43.545 kg)  03/12/14 97 lb (43.999 kg)  10/08/13 96 lb 3.2 oz (43.636 kg)  07/11/13 97 lb 6.4 oz (44.18 kg)  07/04/13 98 lb (44.453 kg)  06/29/13 102 lb 11.8 oz (46.6 kg)  04/23/12 103 lb (46.72 kg)    Usual Body Weight: 100 lb  % Usual Body Weight: 96%  BMI:  Body mass index is 17.01 kg/(m^2).  Estimated Nutritional Needs: Kcal: 1300-1500 Protein: 65-75g Fluid: 1.5L/day  Skin: abrasion on leg  Diet Order: Diet Heart  EDUCATION NEEDS: -No education needs identified at this time  No intake or output data in the 24 hours ending 08/10/14 1209  Last BM: 12/13  Labs:   Recent Labs Lab 08/07/14 0354 08/09/14 1700 08/10/14 0535  NA 131* 135* 137  K 3.8 3.8 3.9  CL 97 99 103  CO2 21 21 23   BUN 27* 16 14  CREATININE 0.76 0.73 0.64  CALCIUM 9.0 9.1 9.2  GLUCOSE 93 103* 96    CBG (last 3)  No results for input(s): GLUCAP in the last 72 hours.  Scheduled Meds: . acetaminophen  1,000 mg Oral TID WC & HS  . amLODipine  2.5 mg Oral Daily  . beta carotene w/minerals  1 tablet Oral BID  . calcium-vitamin D  1 tablet Oral BID WC  . ceFEPime (MAXIPIME) IV  1 g Intravenous Q24H  . feeding supplement (ENSURE COMPLETE)  237 mL Oral TID BM  . fluticasone  1 spray Each Nare Daily  . heparin  5,000 Units Subcutaneous 3 times per day  . loratadine  10 mg Oral Daily  .  nystatin  5 mL Mouth/Throat QID  . pantoprazole  40 mg Oral Daily  . polyethylene glycol  17 g Oral QPM  . vancomycin  500 mg Intravenous Q12H  . vitamin B-12  1,000 mcg Oral Daily    Continuous Infusions:   Past Medical History  Diagnosis Date  . Arthritis   . Carpal tunnel syndrome on left   . DJD (degenerative joint disease)   . Diverticulosis   . Macular degeneration syndrome   . GERD (gastroesophageal reflux disease)   . Headache(784.0)   . Wears glasses   . Wears hearing aid     both  . Wears partial dentures     top and bottom  . High  blood pressure     echo 09-normal  . Lower GI bleed 06/2013    required 2 units PRBC    Past Surgical History  Procedure Laterality Date  . Appendectomy    . Hip surgery      left  . Knee surgery    . Tonsillectomy    . Thyroid cyst excision    . Eye surgery      cataractas  . Carpal tunnel release  2/12    rt  . Joint replacement  2006    rt total hip x2  . Carpal tunnel release  04/23/2012    Procedure: CARPAL TUNNEL RELEASE;  Surgeon: Wynonia Sours, MD;  Location: [place];  Service: Orthopedics;  Laterality: Left;    Clayton Bibles, MS, RD, LDN Pager: [phone] After Hours Pager: [phone]

## 2014-08-10 NOTE — Progress Notes (Signed)
TRH Progress Note       PATIENT DETAILS Name: [name] Age: 80 y.o. Sex: female Date of Birth: 22-Dec-1916 Admit Date: 08/06/2014 Admitting Physician Ivor Costa, MD UMP:[id number] Marcello Moores, MD  HPI 80 y.o. female with past medical history of remote GI bleeding due to diverticulosis, hypertension, GERD, dementia, who presents with shortness of breath found to have HCAP and a pleural effusion.  Subjective: No complaints, breathing better this morning  Assessment/Plan:  HCAP (healthcare-associated pneumonia):Improved. No further shortness of breath. Remains afebrile, with mild leukocytosis.  - Continue empiric Vanco/Cefepime.  - Urine Strep Antigen negative, Legionella Urine antigen pending.  - Blood culture neg as well.Obtain SLP eval.  Pleural Effusion: exudative, likely para pneumonic. However appears to be loculated. Pleural fluid cultures neg, cytology pending. Remains on IV Abx as noted above. - pulmonology following.  - repeat thoracentesis today   Hypertension: Currently stable, continue amlodipine  Dementia: Currently stable, at baseline-mildly confused.  GERD (gastroesophageal reflux disease): Continue PPI  Protein calorie malnutrition: Continue nutritional supplements  Lung nodule: 7 mL right middle lobe pulmonary nodule, outpatient followup per PCPs discretion given advanced age    Antibiotics: Vancomycin 12/10 >> Cefepime 12/10 >>  DVT Prophylaxis: Prophylactic  Heparin Code Status DNR Disposition: Remain inpatient Family Communication at patient's preference, d/w caregiver bedside  Procedures:  Thoracentesis 12/11  CONSULTS:  pulmonary/intensive care  PHYSICAL EXAM: Vital signs in last 24 hours: Filed Vitals:   08/08/14 2130 08/09/14 0500 08/09/14 1400 08/10/14 0454  BP: 182/69 162/70 132/58 153/76  Pulse: 67 96 104 99  Temp: 98.2 F (36.8 C) 98.5 F (36.9 C) 98.4 F (36.9 C) 98.2 F (36.8 C)  TempSrc: Oral Oral Oral Oral  Resp:  18 20 18 18   Height:      Weight:      SpO2: 98% 99% 96% 100%    Weight change:  Filed Weights   08/06/14 1822  Weight: 43.545 kg (96 lb)   Body mass index is 17.01 kg/(m^2).   Gen Exam: Awake and alert-but mildly confused, has clear speech.   Neck: Supple, No JVD.   Chest: B/L Clear-but with decreased air entry at Right base area CVS: S1 S2 Regular, no murmurs. Abdomen: soft, BS +, non tender, non distended.  Extremities: no edema, lower extremities warm to touch. Neurologic: Non Focal.   Skin: No Rash.   Wounds: N/A.    Intake/Output from previous day: No intake or output data in the 24 hours ending 08/10/14 0950   LAB RESULTS: CBC  Recent Labs Lab 08/06/14 1817 08/07/14 0354  WBC 17.4* 14.0*  HGB 13.3 11.3*  HCT 38.8 33.3*  PLT 482* 532*  MCV 89.6 88.6  MCH 30.7 30.1  MCHC 34.3 33.9  RDW 13.4 13.5  LYMPHSABS 1.9 2.2  MONOABS 1.1* 1.0  EOSABS 0.0 0.0  BASOSABS 0.0 0.0    Chemistries   Recent Labs Lab 08/06/14 1817 08/07/14 0354 08/09/14 1700 08/10/14 0535  NA 128* 131* 135* 137  K 4.1 3.8 3.8 3.9  CL 89* 97 99 103  CO2 22 21 21 23   GLUCOSE 112* 93 103* 96  BUN 34* 27* 16 14  CREATININE 0.87 0.76 0.73 0.64  CALCIUM 9.4 9.0 9.1 9.2   GFR Estimated Creatinine Clearance: 29.5 mL/min (by C-G formula based on Cr of 0.64).  Coagulation profile  Recent Labs Lab 08/06/14 1817  INR 1.00    Cardiac Enzymes  Recent Labs Lab 08/06/14 2232 08/07/14 0354 08/07/14 0958  TROPONINI <  0.30 <0.30 <0.30   MICROBIOLOGY: Recent Results (from the past 240 hour(s))  Blood Culture (routine x 2)     Status: None (Preliminary result)   Collection Time: 08/06/14  6:13 PM  Result Value Ref Range Status   Specimen Description BLOOD RIGHT ANTECUBITAL  Final   Special Requests BOTTLES DRAWN AEROBIC AND ANAEROBIC 5ML  Final   Culture  Setup Time   Final    08/06/2014 23:02 Performed at [facility]    Culture   Final           BLOOD CULTURE  RECEIVED NO GROWTH TO DATE CULTURE WILL BE HELD FOR 5 DAYS BEFORE ISSUING A FINAL NEGATIVE REPORT Performed at [facility]    Report Status PENDING  Incomplete  Blood Culture (routine x 2)     Status: None (Preliminary result)   Collection Time: 08/06/14  6:34 PM  Result Value Ref Range Status   Specimen Description BLOOD RAC  Final   Special Requests BOTTLES DRAWN AEROBIC AND ANAEROBIC 5ML  Final   Culture  Setup Time   Final    08/06/2014 23:03 Performed at [facility]    Culture   Final           BLOOD CULTURE RECEIVED NO GROWTH TO DATE CULTURE WILL BE HELD FOR 5 DAYS BEFORE ISSUING A FINAL NEGATIVE REPORT Performed at [facility]    Report Status PENDING  Incomplete  Urine culture     Status: None   Collection Time: 08/06/14  7:04 PM  Result Value Ref Range Status   Specimen Description URINE, CATHETERIZED  Final   Special Requests NONE  Final   Culture  Setup Time   Final    08/07/2014 01:14 Performed at [place] Performed at [facility]   Final   Culture NO GROWTH Performed at [facility]   Final   Report Status 08/07/2014 FINAL  Final  AFB culture with smear     Status: None (Preliminary result)   Collection Time: 08/07/14 12:34 PM  Result Value Ref Range Status   Specimen Description PLEURAL RIGHT  Final   Special Requests NONE  Final   Acid Fast Smear   Final    NO ACID FAST BACILLI SEEN Performed at [facility]    Culture   Final    CULTURE WILL BE EXAMINED FOR 6 WEEKS BEFORE ISSUING A FINAL REPORT Performed at [facility]    Report Status PENDING  Incomplete  Body fluid culture     Status: None (Preliminary result)   Collection Time: 08/07/14 12:34 PM  Result Value Ref Range Status   Specimen Description PLEURAL RIGHT  Final   Special Requests NONE  Final   Gram Stain   Final    RARE WBC PRESENT, PREDOMINANTLY MONONUCLEAR NO ORGANISMS SEEN Performed  at [facility]    Culture   Final    NO GROWTH 2 DAYS Performed at [facility]    Report Status PENDING  Incomplete  Fungal stain     Status: None   Collection Time: 08/07/14 12:34 PM  Result Value Ref Range Status   Specimen Description PLEURAL RIGHT  Final   Special Requests NONE  Final   Fungal Smear   Final    NO YEAST OR FUNGAL ELEMENTS SEEN Performed at [facility]    Report Status 08/09/2014 FINAL  Final  RADIOLOGY STUDIES/RESULTS: Ct Chest Wo Contrast 08/06/2014 1. Moderate loculated RIGHT pleural effusion, likely producing shortness of breath. This produces substantial atelectasis of the RIGHT lower lobe and partial atelectasis of the RIGHT middle lobe. This could represent a parapneumonic effusion or malignant effusion. Ordinary effusion associated with CHF is also possible. 2. 7 mm RIGHT middle lobe pulmonary nodule. Although follow-up is typically recommended, this is at the discretion of the referring clinician in this patient of advanced age. Guideline recommendations as follows: If the patient is at high risk for bronchogenic carcinoma, follow-up chest CT at 3-10months is recommended. If the patient is at low risk for bronchogenic carcinoma, follow-up chest CT at 6-12 months is recommended. This recommendation follows the consensus statement: Guidelines for Management of Small Pulmonary Nodules Detected on CT Scans: A Statement from the [place] as published in Radiology 2005; 237:395-400. 3. Severe atherosclerosis and coronary artery disease with ectasia of the ascending aorta. Again, follow-up is at the discretion of the referring clinician. Guideline recommendations as follows: Ectatic to mildly aneurysmal ascending thoracic aorta. Recommend annual imaging followup by CTA or MRA. This recommendation follows 2010 ACCF/AHA/AATS/ACR/ASA/SCA/SCAI/SIR/STS/SVM Guidelines for the Diagnosis and Management of Patients With Thoracic Aortic Disease.  Circulation. 2010; 121: M384-Y659   Electronically Signed   By: Dereck Ligas M.D.   On: 08/06/2014 21:06   Dg Chest Port 1 View 08/07/2014 No pneumothorax identified following right side thoracentesis. Mildly improved ventilation.   Electronically Signed   By: Lars Pinks M.D.   On: 08/07/2014 13:17   Dg Chest Port 1 View 08/06/2014 New ill-defined opacity in right mid and lower lung, which may be due to infiltrate or mass. Right pleural effusion cannot be excluded. Recommend continued chest radiographic follow-up or chest CT with contrast.   Electronically Signed   By: Earle Gell M.D.   On: 08/06/2014 18:15   Scheduled Meds: . acetaminophen  1,000 mg Oral TID WC & HS  . amLODipine  2.5 mg Oral Daily  . beta carotene w/minerals  1 tablet Oral BID  . calcium-vitamin D  1 tablet Oral BID WC  . ceFEPime (MAXIPIME) IV  1 g Intravenous Q24H  . feeding supplement (ENSURE COMPLETE)  237 mL Oral TID BM  . fluticasone  1 spray Each Nare Daily  . heparin  5,000 Units Subcutaneous 3 times per day  . loratadine  10 mg Oral Daily  . nystatin  5 mL Mouth/Throat QID  . pantoprazole  40 mg Oral Daily  . polyethylene glycol  17 g Oral QPM  . vancomycin  500 mg Intravenous Q12H  . vitamin B-12  1,000 mcg Oral Daily   Continuous Infusions:  PRN Meds:.albuterol, hydrocortisone, zolpidem  Time spent 25 minutes  Marzetta Board, MD  [facility] Pager:[phone]  If 7PM-7AM, please contact night-coverage [email or web address] Password [email or web address] 08/10/2014, 9:50 AM   LOS: 4 days

## 2014-08-10 NOTE — Consult Note (Signed)
   Name: [name] MRN: [id number] DOB: 1917-08-01    ADMISSION DATE:  08/06/2014 CONSULTATION DATE:  12/11  REFERRING MD :  RAI  CHIEF COMPLAINT:  Pleural effusion  BRIEF PATIENT DESCRIPTION:  his is a 80 year old female who resides at SNF. She has a sig h/o GI bleeding in the setting of Diverticular disease, HTN, GERD and dementia. Per history she began to c/w dyspnea about three days prior to admission. She had low grade temp and was started on Levaquin w/ clinical dx of PNA. Her symptoms worsened and began to be associated w/ pleuritic type CP over right rib cage. She denied cough. She was seen in the ER & admitted by IM service w/ working dx of HCAP. A CT chest was obtained and showed mod right pleural effusion which was the reason for PCCM consult.     SIGNIFICANT EVENTS  CT scan 12/10: mod loculated right pleural effusion, 69mm right ML pulmonary nodule Right thoracentesis 12/11: drained about 100 ml. Still had fluid seemingly loculated to right.  LDH 12/11>>> 926 Protein 12/11>>> Cytology 12/11>>> 82% PMN of total 1400 WBC, Haszy  culture 12/11>>> Fungal 12/11>>> AFB 12/11>>>    SUBJECTIVE/OVERNIGHT/INTERVAL HX 08/10/14: Sitting comfortable in chair , Cartaker an HCPOA at bedside. Patient wants to go home  VITAL SIGNS: Temp:  [98.2 F (36.8 C)-98.4 F (36.9 C)] 98.2 F (36.8 C) (12/14 0454) Pulse Rate:  [99-104] 99 (12/14 0454) Resp:  [18] 18 (12/14 0454) BP: (132-153)/(58-76) 153/76 mmHg (12/14 0454) SpO2:  [96 %-100 %] 100 % (12/14 0454)  PHYSICAL EXAMINATION: General:  Elderly white female, resting comfortably up in chair  Neuro:  Hard of hearing but otherwise no focal def  HEENT:  NCAT Cardiovascular:  rrr Lungs:  Decreased on right.  Abdomen:  Soft, non-tender + bowel sounds  Musculoskeletal:  rrr Skin:  Right LE wrapped w/ kerlex covering skin tear    Recent Labs Lab 08/07/14 0354 08/09/14 1700 08/10/14 0535  NA 131* 135* 137  K 3.8 3.8 3.9   CL 97 99 103  CO2 21 21 23   BUN 27* 16 14  CREATININE 0.76 0.73 0.64  GLUCOSE 93 103* 96    Recent Labs Lab 08/06/14 1817 08/07/14 0354  HGB 13.3 11.3*  HCT 38.8 33.3*  WBC 17.4* 14.0*  PLT 482* 532*   No results found.  ASSESSMENT / PLAN:  Loculated right pleural Pleural effusion  Possible HCAP  Pulmonary nodule.   Discussion  - findings c/w parapneumonic effusion (cytology pending). No pH or glucose data available to indicate need for chest tube/drainage. However, LDH is > 900 suggesting what remains likely needs drainage. I think chest tube and VATS are too risk for her. I will get [place] chest by radiology and if sufficient fluid remains, recommend IR/US guided drainage of pleural space to extent possible.  IF not, possible, leave alone so it forms a rind  Caretaker in agreement with plan  Risks, benefits, and limitations of above plan explained   Dr. Brand Males, M.D., [facility].C.P Pulmonary and Critical Care Medicine Staff Physician [place] [facility] Pager: [phone], If no answer or between  15:00h - 7:00h: call [phone]  08/10/2014 9:22 AM

## 2014-08-11 ENCOUNTER — Inpatient Hospital Stay (HOSPITAL_COMMUNITY): Payer: Medicare Other

## 2014-08-11 DIAGNOSIS — E43 Unspecified severe protein-calorie malnutrition: Secondary | ICD-10-CM | POA: Insufficient documentation

## 2014-08-11 DIAGNOSIS — J90 Pleural effusion, not elsewhere classified: Secondary | ICD-10-CM | POA: Diagnosis present

## 2014-08-11 LAB — CBC
HEMATOCRIT: 38.1 % (ref 36.0–46.0)
Hemoglobin: 12.6 g/dL (ref 12.0–15.0)
MCH: 29.9 pg (ref 26.0–34.0)
MCHC: 33.1 g/dL (ref 30.0–36.0)
MCV: 90.5 fL (ref 78.0–100.0)
PLATELETS: 671 10*3/uL — AB (ref 150–400)
RBC: 4.21 MIL/uL (ref 3.87–5.11)
RDW: 14.1 % (ref 11.5–15.5)
WBC: 16.6 10*3/uL — AB (ref 4.0–10.5)

## 2014-08-11 LAB — BASIC METABOLIC PANEL
Anion gap: 12 (ref 5–15)
BUN: 15 mg/dL (ref 6–23)
CALCIUM: 9.3 mg/dL (ref 8.4–10.5)
CHLORIDE: 102 meq/L (ref 96–112)
CO2: 24 mEq/L (ref 19–32)
CREATININE: 0.66 mg/dL (ref 0.50–1.10)
GFR calc Af Amer: 85 mL/min — ABNORMAL LOW (ref >90)
GFR calc non Af Amer: 73 mL/min — ABNORMAL LOW (ref >90)
Glucose, Bld: 89 mg/dL (ref 70–99)
Potassium: 4 mEq/L (ref 3.7–5.3)
Sodium: 138 mEq/L (ref 137–147)

## 2014-08-11 LAB — BODY FLUID CULTURE: CULTURE: NO GROWTH

## 2014-08-11 LAB — LEGIONELLA ANTIGEN, URINE

## 2014-08-11 LAB — RHEUMATOID FACTOR: Rhuematoid fact SerPl-aCnc: <10 IU/mL (ref <=14)

## 2014-08-11 NOTE — Progress Notes (Signed)
Clinical Social Work  CSW continues to keep [facility] updated re: DC plans. SNF agreeable to accept patient back to facility when medically stable.  [place], [place] [phone]

## 2014-08-11 NOTE — Progress Notes (Signed)
Name: [name] MRN: [id number] DOB: Apr 16, 1917    ADMISSION DATE:  08/06/2014 CONSULTATION DATE:  12/11  REFERRING MD :  RAI  CHIEF COMPLAINT:  Pleural effusion  BRIEF PATIENT DESCRIPTION:  his is a 80 year old female who resides at SNF. She has a sig h/o GI bleeding in the setting of Diverticular disease, HTN, GERD and dementia. Per history she began to c/w dyspnea about three days prior to admission. She had low grade temp and was started on Levaquin w/ clinical dx of PNA. Her symptoms worsened and began to be associated w/ pleuritic type CP over right rib cage. She denied cough. She was seen in the ER & admitted by IM service w/ working dx of HCAP. A CT chest was obtained and showed mod right pleural effusion which was the reason for PCCM consult.     SIGNIFICANT EVENTS  CT scan 12/10: mod loculated right pleural effusion, 66mm right ML pulmonary nodule Right thoracentesis 12/11: drained about 100 ml. Still had fluid seemingly loculated to right.  LDH 12/11>>> 926 Protein 12/11>>> Cytology 12/11>>> 82% PMN of total 1400 WBC, Haszy  culture 12/11>>> Fungal 12/11>>> AFB 12/11>>>  08/10/14: Sitting comfortable in chair , Cartaker an HCPOA at bedside. Patient wants to go home   -r epeat thora only 50cc obtained. LDH 931, Glucose 40, pH 7.5   SUBJECTIVE/OVERNIGHT/INTERVAL HX 08/11/14: AGain difficulty in removing lot of pleural fluid yesterday. Pleural fluid LDH 931 (nearly 1000) and low glucose < 60 (her is 40) suggest this is complicated effusion   VITAL SIGNS: Temp:  [98.1 F (36.7 C)-98.5 F (36.9 C)] 98.2 F (36.8 C) (12/15 0545) Pulse Rate:  [89-99] 99 (12/15 0545) Resp:  [18-20] 18 (12/15 0545) BP: (149-159)/(70-87) 149/82 mmHg (12/15 0545) SpO2:  [97 %-100 %] 97 % (12/15 0545)  PHYSICAL EXAMINATION: General:  Elderly white female, resting comfortably up in chair  Neuro:  Hard of hearing but otherwise no focal def  HEENT:  NCAT Cardiovascular:   rrr Lungs:  Decreased on right.  Abdomen:  Soft, non-tender + bowel sounds  Musculoskeletal:  rrr Skin:  Right LE wrapped w/ kerlex covering skin tear    Recent Labs Lab 08/09/14 1700 08/10/14 0535 08/11/14 0445  NA 135* 137 138  K 3.8 3.9 4.0  CL 99 103 102  CO2 21 23 24   BUN 16 14 15   CREATININE 0.73 0.64 0.66  GLUCOSE 103* 96 89    Recent Labs Lab 08/06/14 1817 08/07/14 0354 08/11/14 0445  HGB 13.3 11.3* 12.6  HCT 38.8 33.3* 38.1  WBC 17.4* 14.0* 16.6*  PLT 482* 532* 671*   Dg Chest 1 View  08/10/2014   CLINICAL DATA:  Status post thoracentesis  EXAM: CHEST - 1 VIEW  COMPARISON:  08/07/2014  FINDINGS: No pneumothorax status post thoracentesis.  Layering moderate right pleural effusion. Associated right lower lobe opacity, possibly compressive atelectasis.  Mild left basilar opacity, likely atelectasis.  The heart is normal in size.  Degenerative changes of the right shoulder with deformity of the humeral head.  IMPRESSION: No pneumothorax status post thoracentesis.  Layering moderate right pleural effusion.   Electronically Signed   By: Julian Hy M.D.   On: 08/10/2014 11:05   US Thoracentesis Asp Pleural Space W/img Guide  08/10/2014   INDICATION: Symptomatic right sided loculated pleural effusion  EXAM: US THORACENTESIS ASP PLEURAL SPACE W/IMG GUIDE  COMPARISON:  Pulmonology performed bedside.  MEDICATIONS: None  COMPLICATIONS: None immediate  TECHNIQUE: Informed written  consent was obtained from the patient's POA after a discussion of the risks, benefits and alternatives to treatment. A timeout was performed prior to the initiation of the procedure.  Initial ultrasound scanning demonstrates a severely loculated right pleural effusion. The lower chest was prepped and draped in the usual sterile fashion. 1% lidocaine was used for local anesthesia.  Under direct ultrasound guidance, a 19 gauge, 7-cm, Yueh catheter was introduced. An ultrasound image was saved for  documentation purposes. The thoracentesis was performed. The catheter was removed and a dressing was applied. The patient tolerated the procedure well without immediate post procedural complication. The patient was escorted to have an upright chest radiograph.  FINDINGS: A total of approximately 69ml of serous fluid was removed, unable to drain any additional fluid secondary to severely loculated effusion. Requested samples were sent to the laboratory.  IMPRESSION: Successful ultrasound-guided right sided thoracentesis yielding 50 ml of pleural fluid.  Read By:  Tsosie Billing PA-C   Electronically Signed   By: Aletta Edouard M.D.   On: 08/10/2014 12:14    ASSESSMENT / PLAN:  Loculated right pleural Pleural effusion  - LDH 900s and glucose < 60 - c/w complicated effusion Possible HCAP  Pulmonary nodule.   Discussion  - findings c/w complicated parapneumonic effusion (cytology pending).  - Ddx Pneumonia and RA as cause with malignancy a distant 3rd  PLAN  - await cytology  - needs drainage in ideal world with chest tube (thora x 2 results in minima fluid) but chest tube is risk for her unless effusion large. So, will get CT chest. IF fluid small well and good. If fluid large, need to discuss risk of chest tube v observation - will rule out RA with RF and CCP antibodies   Care taker agreement with plan +  Dr. Brand Males, M.D., [facility].C.P Pulmonary and Critical Care Medicine Staff Physician [place] [facility] Pager: [phone], If no answer or between  15:00h - 7:00h: call [phone]  08/11/2014 7:34 AM

## 2014-08-11 NOTE — Progress Notes (Signed)
[facility] PROGRESS NOTE  [name] SPQ:[id number] DOB: 08-14-1917 DOA: 08/06/2014 PCP: Mathews Argyle, MD  Brief narrative 80 year old female resident of skilled nursing facility with history of GERD, dementia, hypertension, remote history of diverticular bleed presented with shortness of breath and found to have pneumonia with right parapneumonic effusion.   Assessment/Plan: Healthcare associated pneumonia with right sided parapneumonic effusion Continue empiric vancomycin and cefepime. Cultures so far negative. Local related pleural effusion on CT scan. Thoracenteses done on 12/11 and 12/14 with drainage of 100 mL and 50 mL pleural fluid. Appears exudative with high LDH and low glucose. but  cultures negative. CT also shows 7 mm right middle lobe pulmonary nodule. Pleural fluid cytology negative for malignant cells. -Pulmonary recommends placement of chest tube but given the risk involved with it ordered repeat CT scan of the chest which shows unchanged moderate local related pleural effusion. Will decide on chest tube vs observation. Rheumatoid factor negative. CCP pending. -Patient afebrile but still has leukocytosis. Maintaining O2 sat on room air.  Hypertension Stable. Continue amlodipine  Mild-to-moderate dementia Currently stable  GERD Continue PPI  Protein calorie malnutrition, severe Continue supplements  Right middle lobe pulmonary nodule, 7 mm in size Outpatient follow-up  DVT prophylaxis: Subcutaneous heparin  Diet: Heart healthy     Code Status: DO NOT RESUSCITATE Family Communication: None at bedside Disposition Plan: Return to skilled nursing facility once medically stable.   Consultants:  Pulmonary  IR  Procedures:  Right thoracentesis on 12/11 and 12/14  Antibiotics:  IV vancomycin and cefepime since 12/10  HPI/Subjective: Since seen and examined. No overnight issues. Appears confused.  Objective: Filed Vitals:    08/11/14 1358  BP: 152/87  Pulse: 85  Temp: 98.6 F (37 C)  Resp: 20    Intake/Output Summary (Last 24 hours) at 08/11/14 1724 Last data filed at 08/11/14 1359  Gross per 24 hour  Intake    970 ml  Output      0 ml  Net    970 ml   Filed Weights   08/06/14 1822  Weight: 43.545 kg (96 lb)    Exam:   General:  Elderly thin built female in no acute distress  HEENT: No pallor, moist oral mucosa  Chest: Diminished breath sounds over right lung, no crackles, rhonchi or wheeze  CVS: Normal S1 and S2, no murmurs  Abdomen: Soft, nondistended, nontender, bowel sounds present  Extremities: Warm, no edema   CNS: AAO X1-2  Data Reviewed: Basic Metabolic Panel:  Recent Labs Lab 08/06/14 1817 08/07/14 0354 08/09/14 1700 08/10/14 0535 08/11/14 0445  NA 128* 131* 135* 137 138  K 4.1 3.8 3.8 3.9 4.0  CL 89* 97 99 103 102  CO2 22 21 21 23 24   GLUCOSE 112* 93 103* 96 89  BUN 34* 27* 16 14 15   CREATININE 0.87 0.76 0.73 0.64 0.66  CALCIUM 9.4 9.0 9.1 9.2 9.3   Liver Function Tests:  Recent Labs Lab 08/06/14 1817 08/07/14 0354 08/07/14 0958  AST 14 14  --   ALT 12 11  --   ALKPHOS 163* 143*  --   BILITOT 0.3 0.3  --   PROT 6.5 5.7* 5.5*  ALBUMIN 2.2* 1.9*  --    No results for input(s): LIPASE, AMYLASE in the last 168 hours. No results for input(s): AMMONIA in the last 168 hours. CBC:  Recent Labs Lab 08/06/14 1817 08/07/14 0354 08/11/14 0445  WBC 17.4* 14.0* 16.6*  NEUTROABS 14.4* 10.8*  --  HGB 13.3 11.3* 12.6  HCT 38.8 33.3* 38.1  MCV 89.6 88.6 90.5  PLT 482* 532* 671*   Cardiac Enzymes:  Recent Labs Lab 08/06/14 1817 08/06/14 2232 08/07/14 0354 08/07/14 0958  TROPONINI <0.30 <0.30 <0.30 <0.30   BNP (last 3 results)  Recent Labs  08/07/14 0354  PROBNP 1828.0*   CBG: No results for input(s): GLUCAP in the last 168 hours.  Recent Results (from the past 240 hour(s))  Blood Culture (routine x 2)     Status: None (Preliminary result)    Collection Time: 08/06/14  6:13 PM  Result Value Ref Range Status   Specimen Description BLOOD RIGHT ANTECUBITAL  Final   Special Requests BOTTLES DRAWN AEROBIC AND ANAEROBIC 5ML  Final   Culture  Setup Time   Final    08/06/2014 23:02 Performed at [facility]    Culture   Final           BLOOD CULTURE RECEIVED NO GROWTH TO DATE CULTURE WILL BE HELD FOR 5 DAYS BEFORE ISSUING A FINAL NEGATIVE REPORT Performed at [facility]    Report Status PENDING  Incomplete  Blood Culture (routine x 2)     Status: None (Preliminary result)   Collection Time: 08/06/14  6:34 PM  Result Value Ref Range Status   Specimen Description BLOOD RAC  Final   Special Requests BOTTLES DRAWN AEROBIC AND ANAEROBIC 5ML  Final   Culture  Setup Time   Final    08/06/2014 23:03 Performed at [facility]    Culture   Final           BLOOD CULTURE RECEIVED NO GROWTH TO DATE CULTURE WILL BE HELD FOR 5 DAYS BEFORE ISSUING A FINAL NEGATIVE REPORT Performed at [facility]    Report Status PENDING  Incomplete  Urine culture     Status: None   Collection Time: 08/06/14  7:04 PM  Result Value Ref Range Status   Specimen Description URINE, CATHETERIZED  Final   Special Requests NONE  Final   Culture  Setup Time   Final    08/07/2014 01:14 Performed at [place] Performed at [facility]   Final   Culture NO GROWTH Performed at [facility]   Final   Report Status 08/07/2014 FINAL  Final  AFB culture with smear     Status: None (Preliminary result)   Collection Time: 08/07/14 12:34 PM  Result Value Ref Range Status   Specimen Description PLEURAL RIGHT  Final   Special Requests NONE  Final   Acid Fast Smear   Final    NO ACID FAST BACILLI SEEN Performed at [facility]    Culture   Final    CULTURE WILL BE EXAMINED FOR 6 WEEKS BEFORE ISSUING A FINAL REPORT Performed at [facility]    Report Status  PENDING  Incomplete  Body fluid culture     Status: None   Collection Time: 08/07/14 12:34 PM  Result Value Ref Range Status   Specimen Description PLEURAL RIGHT  Final   Special Requests NONE  Final   Gram Stain   Final    RARE WBC PRESENT, PREDOMINANTLY MONONUCLEAR NO ORGANISMS SEEN Performed at [facility]    Culture   Final    NO GROWTH 3 DAYS Performed at [facility]    Report Status 08/11/2014 FINAL  Final  Fungal stain  Status: None   Collection Time: 08/07/14 12:34 PM  Result Value Ref Range Status   Specimen Description PLEURAL RIGHT  Final   Special Requests NONE  Final   Fungal Smear   Final    NO YEAST OR FUNGAL ELEMENTS SEEN Performed at [facility]    Report Status 08/09/2014 FINAL  Final     Studies: Dg Chest 1 View  08/10/2014   CLINICAL DATA:  Status post thoracentesis  EXAM: CHEST - 1 VIEW  COMPARISON:  08/07/2014  FINDINGS: No pneumothorax status post thoracentesis.  Layering moderate right pleural effusion. Associated right lower lobe opacity, possibly compressive atelectasis.  Mild left basilar opacity, likely atelectasis.  The heart is normal in size.  Degenerative changes of the right shoulder with deformity of the humeral head.  IMPRESSION: No pneumothorax status post thoracentesis.  Layering moderate right pleural effusion.   Electronically Signed   By: Julian Hy M.D.   On: 08/10/2014 11:05   Ct Chest Wo Contrast  08/11/2014   CLINICAL DATA:  Loculated pleural effusion, shortness of breath.  EXAM: CT CHEST WITHOUT CONTRAST  TECHNIQUE: Multidetector CT imaging of the chest was performed following the standard protocol without IV contrast.  COMPARISON:  08/06/2014  FINDINGS: The moderate loculated pleural effusion is again noted, minimally changed in overall size. There are locules of air now within the pleural space posteriorly. There has been recent thoracentesis. These may have been introduced during thoracentesis.  This can also be seen with bronchopleural fistula.  Trace left pleural effusion, new since prior study. Airspace disease with air bronchograms in the right lower lobe, similar to prior study, likely compressive atelectasis. Dependent atelectasis posteriorly in the left lower lobe, slightly improved since prior study.  There is cardiomegaly. Densely calcified coronary arteries noted. Tortuosity and ectasia of the thoracic aorta. Small scattered mediastinal lymph nodes, none pathologically enlarged. No axillary adenopathy.  Chest wall soft tissues are unremarkable. Imaging into the upper abdomen shows no acute findings. Gallstones noted within the gallbladder. Calcifications in the spleen compatible with old granulomatous disease.  IMPRESSION: No significant change in the size of the multiloculated right pleural effusion. Locules of air are now present within the pleural space and may be related to recent thoracentesis. Bronchopleural fistula could have this appearance. However, given the recent thoracentesis, this is felt less likely.  Atelectasis in both lower lobes, right greater than left.  New trace left pleural effusion.  Cardiomegaly.  Old granulomatous disease.   Electronically Signed   By: Rolm Baptise M.D.   On: 08/11/2014 15:47   US Thoracentesis Asp Pleural Space W/img Guide  08/10/2014   INDICATION: Symptomatic right sided loculated pleural effusion  EXAM: US THORACENTESIS ASP PLEURAL SPACE W/IMG GUIDE  COMPARISON:  Pulmonology performed bedside.  MEDICATIONS: None  COMPLICATIONS: None immediate  TECHNIQUE: Informed written consent was obtained from the patient's POA after a discussion of the risks, benefits and alternatives to treatment. A timeout was performed prior to the initiation of the procedure.  Initial ultrasound scanning demonstrates a severely loculated right pleural effusion. The lower chest was prepped and draped in the usual sterile fashion. 1% lidocaine was used for local anesthesia.   Under direct ultrasound guidance, a 19 gauge, 7-cm, Yueh catheter was introduced. An ultrasound image was saved for documentation purposes. The thoracentesis was performed. The catheter was removed and a dressing was applied. The patient tolerated the procedure well without immediate post procedural complication. The patient was escorted to have an upright chest  radiograph.  FINDINGS: A total of approximately 62ml of serous fluid was removed, unable to drain any additional fluid secondary to severely loculated effusion. Requested samples were sent to the laboratory.  IMPRESSION: Successful ultrasound-guided right sided thoracentesis yielding 50 ml of pleural fluid.  Read By:  Tsosie Billing PA-C   Electronically Signed   By: Aletta Edouard M.D.   On: 08/10/2014 12:14    Scheduled Meds: . acetaminophen  1,000 mg Oral TID WC & HS  . amLODipine  2.5 mg Oral Daily  . beta carotene w/minerals  1 tablet Oral BID  . calcium-vitamin D  1 tablet Oral BID WC  . ceFEPime (MAXIPIME) IV  1 g Intravenous Q24H  . feeding supplement (ENSURE COMPLETE)  237 mL Oral TID BM  . fluticasone  1 spray Each Nare Daily  . heparin  5,000 Units Subcutaneous 3 times per day  . loratadine  10 mg Oral Daily  . nystatin  5 mL Mouth/Throat QID  . pantoprazole  40 mg Oral Daily  . polyethylene glycol  17 g Oral QPM  . vancomycin  500 mg Intravenous Q12H  . vitamin B-12  1,000 mcg Oral Daily   Continuous Infusions:     Time spent: 25 minutes    Jaycelyn Orrison  [facility] Pager [phone] If 7PM-7AM, please contact night-coverage at [email or web address], password [facility] 08/11/2014, 5:24 PM  LOS: 5 days

## 2014-08-12 ENCOUNTER — Encounter (HOSPITAL_COMMUNITY): Payer: Self-pay | Admitting: Internal Medicine

## 2014-08-12 ENCOUNTER — Encounter: Payer: Self-pay | Admitting: Nurse Practitioner

## 2014-08-12 LAB — CBC
HEMATOCRIT: 38.9 % (ref 36.0–46.0)
HEMOGLOBIN: 12.6 g/dL (ref 12.0–15.0)
MCH: 29.9 pg (ref 26.0–34.0)
MCHC: 32.4 g/dL (ref 30.0–36.0)
MCV: 92.2 fL (ref 78.0–100.0)
Platelets: 642 10*3/uL — ABNORMAL HIGH (ref 150–400)
RBC: 4.22 MIL/uL (ref 3.87–5.11)
RDW: 14.1 % (ref 11.5–15.5)
WBC: 16.8 10*3/uL — AB (ref 4.0–10.5)

## 2014-08-12 LAB — CULTURE, BLOOD (ROUTINE X 2)
Culture: NO GROWTH
Culture: NO GROWTH

## 2014-08-12 LAB — CYCLIC CITRUL PEPTIDE ANTIBODY, IGG: Cyclic Citrullin Peptide Ab: <2 U/mL (ref 0.0–5.0)

## 2014-08-12 LAB — VANCOMYCIN, TROUGH: VANCOMYCIN TR: 15.8 ug/mL (ref 10.0–20.0)

## 2014-08-12 MED ORDER — LEVOFLOXACIN 500 MG PO TABS
500.0000 mg | ORAL_TABLET | Freq: Every day | ORAL | Status: DC
  Administered 2014-08-12: 500 mg via ORAL
  Filled 2014-08-12: qty 1

## 2014-08-12 MED ORDER — ZOLPIDEM TARTRATE 5 MG PO TABS: 5.0000 mg | ORAL_TABLET | Freq: Every day | ORAL | Status: DC

## 2014-08-12 MED ORDER — LEVOFLOXACIN 500 MG PO TABS: 500.0000 mg | ORAL_TABLET | Freq: Every day | ORAL | Status: AC

## 2014-08-12 MED ORDER — TRAMADOL HCL 50 MG PO TABS: 50.0000 mg | ORAL_TABLET | Freq: Four times a day (QID) | ORAL | Status: DC | PRN

## 2014-08-12 NOTE — Progress Notes (Signed)
Name: [name] MRN: [id number] DOB: [birth year]    ADMISSION DATE:  08/06/2014 CONSULTATION DATE:  12/11  REFERRING MD :  RAI  CHIEF COMPLAINT:  Pleural effusion  BRIEF PATIENT DESCRIPTION:  his is a 80 year old female who resides at SNF. She has a sig h/o GI bleeding in the setting of Diverticular disease, HTN, GERD and dementia. Per history she began to c/w dyspnea about three days prior to admission. She had low grade temp and was started on Levaquin w/ clinical dx of PNA. Her symptoms worsened and began to be associated w/ pleuritic type CP over right rib cage. She denied cough. She was seen in the ER & admitted by IM service w/ working dx of HCAP. A CT chest was obtained and showed mod right pleural effusion which was the reason for PCCM consult.     SIGNIFICANT EVENTS  CT scan 12/10: mod loculated right pleural effusion, 30mm right ML pulmonary nodule Right thoracentesis 12/11: drained about 100 ml. Still had fluid seemingly loculated to right.  LDH 12/11>>> 926 Protein 12/11>>> Cytology 12/11>>> 82% PMN of total 1400 WBC, Haszy  culture 12/11>>> Fungal 12/11>>> AFB 12/11>>>  08/10/14: Sitting comfortable in chair , Cartaker an HCPOA at bedside. Patient wants to go home   -r epeat thora only 50cc obtained. LDH 931, Glucose 40, pH 7.5   08/11/14: AGain difficulty in removing lot of pleural fluid yesterday. Pleural fluid LDH 931 (nearly 1000) and low glucose < 60 (her is 40) suggest this is complicated effusion    SUBJECTIVE/OVERNIGHT/INTERVAL HX 08/12/14: Wants to go home. Discussed opd abx rx v chest tube with her and caretaker Enid Derry - long disucssion - favors home antibiotics. D/w IR as well - favofe less invasive option. RA workup is negative  VITAL SIGNS: Temp:  [98 F (36.7 C)-98.8 F (37.1 C)] 98.8 F (37.1 C) (12/16 0445) Pulse Rate:  [85-100] 100 (12/16 0445) Resp:  [16-20] 16 (12/16 0445) BP: (149-161)/(78-87) 161/84 mmHg (12/16 0445) SpO2:  [99 %]  99 % (12/16 0445)  PHYSICAL EXAMINATION: General:  Elderly white female, resting comfortably up in chair  Neuro:  Hard of hearing but otherwise no focal def , Oriented , Understands +, Capacity + HEENT:  NCAT Cardiovascular:  rrr Lungs:  Decreased on right.  Abdomen:  Soft, non-tender + bowel sounds  Musculoskeletal:  rrr Skin:  Right LE wrapped w/ kerlex covering skin tear    Recent Labs Lab 08/09/14 1700 08/10/14 0535 08/11/14 0445  NA 135* 137 138  K 3.8 3.9 4.0  CL 99 103 102  CO2 21 23 24   BUN 16 14 15   CREATININE 0.73 0.64 0.66  GLUCOSE 103* 96 89    Recent Labs Lab 08/07/14 0354 08/11/14 0445 08/12/14 0433  HGB 11.3* 12.6 12.6  HCT 33.3* 38.1 38.9  WBC 14.0* 16.6* 16.8*  PLT 532* 671* 642*   Dg Chest 1 View  08/10/2014   CLINICAL DATA:  Status post thoracentesis  EXAM: CHEST - 1 VIEW  COMPARISON:  08/07/2014  FINDINGS: No pneumothorax status post thoracentesis.  Layering moderate right pleural effusion. Associated right lower lobe opacity, possibly compressive atelectasis.  Mild left basilar opacity, likely atelectasis.  The heart is normal in size.  Degenerative changes of the right shoulder with deformity of the humeral head.  IMPRESSION: No pneumothorax status post thoracentesis.  Layering moderate right pleural effusion.   Electronically Signed   By: Julian Hy M.D.   On: 08/10/2014 11:05  Ct Chest Wo Contrast  08/11/2014   CLINICAL DATA:  Loculated pleural effusion, shortness of breath.  EXAM: CT CHEST WITHOUT CONTRAST  TECHNIQUE: Multidetector CT imaging of the chest was performed following the standard protocol without IV contrast.  COMPARISON:  08/06/2014  FINDINGS: The moderate loculated pleural effusion is again noted, minimally changed in overall size. There are locules of air now within the pleural space posteriorly. There has been recent thoracentesis. These may have been introduced during thoracentesis. This can also be seen with bronchopleural  fistula.  Trace left pleural effusion, new since prior study. Airspace disease with air bronchograms in the right lower lobe, similar to prior study, likely compressive atelectasis. Dependent atelectasis posteriorly in the left lower lobe, slightly improved since prior study.  There is cardiomegaly. Densely calcified coronary arteries noted. Tortuosity and ectasia of the thoracic aorta. Small scattered mediastinal lymph nodes, none pathologically enlarged. No axillary adenopathy.  Chest wall soft tissues are unremarkable. Imaging into the upper abdomen shows no acute findings. Gallstones noted within the gallbladder. Calcifications in the spleen compatible with old granulomatous disease.  IMPRESSION: No significant change in the size of the multiloculated right pleural effusion. Locules of air are now present within the pleural space and may be related to recent thoracentesis. Bronchopleural fistula could have this appearance. However, given the recent thoracentesis, this is felt less likely.  Atelectasis in both lower lobes, right greater than left.  New trace left pleural effusion.  Cardiomegaly.  Old granulomatous disease.   Electronically Signed   By: Rolm Baptise M.D.   On: 08/11/2014 15:47   US Thoracentesis Asp Pleural Space W/img Guide  08/10/2014   INDICATION: Symptomatic right sided loculated pleural effusion  EXAM: US THORACENTESIS ASP PLEURAL SPACE W/IMG GUIDE  COMPARISON:  Pulmonology performed bedside.  MEDICATIONS: None  COMPLICATIONS: None immediate  TECHNIQUE: Informed written consent was obtained from the patient's POA after a discussion of the risks, benefits and alternatives to treatment. A timeout was performed prior to the initiation of the procedure.  Initial ultrasound scanning demonstrates a severely loculated right pleural effusion. The lower chest was prepped and draped in the usual sterile fashion. 1% lidocaine was used for local anesthesia.  Under direct ultrasound guidance, a 19  gauge, 7-cm, Yueh catheter was introduced. An ultrasound image was saved for documentation purposes. The thoracentesis was performed. The catheter was removed and a dressing was applied. The patient tolerated the procedure well without immediate post procedural complication. The patient was escorted to have an upright chest radiograph.  FINDINGS: A total of approximately 64ml of serous fluid was removed, unable to drain any additional fluid secondary to severely loculated effusion. Requested samples were sent to the laboratory.  IMPRESSION: Successful ultrasound-guided right sided thoracentesis yielding 50 ml of pleural fluid.  Read By:  Tsosie Billing PA-C   Electronically Signed   By: Aletta Edouard M.D.   On: 08/10/2014 12:14    ASSESSMENT / PLAN:  Loculated right pleural Pleural effusion  - LDH 900s and glucose < 60 - c/w complicated effusion Possible HCAP  Pulmonary nodule.   Discussion  - findings c/w complicated parapneumonic effusion (cytology x 2 12/11 and 12/14 - acute inflammation and negative malig cells).  - Autoimmune workup negative.   - Etiology: Very likely Acute bacterial  Pneumonia    PLAN - needs drainage ideally wit tube and instillation of TPA with Dornase - but risks of bleeding, pain, fever, and extra length of stay explained. Currently she feels well and  she and caretaker dw with family and collectively all feel home abx Rx with opd followup preferred  - Rec change IV Abx to PO levaquin x 4 weeks  - OPD fu Pulmonary with CXR 2 view with NP Tammy Parrett 08/31/14   Future Appointments Date Time Provider [place]  08/31/2014 3:15 PM Tammy Jeralene Huff, NP [facility] None     Care taker agreement with plan +  Dr. Brand Males, M.D., [facility].C.P Pulmonary and Critical Care Medicine Staff Physician [place] [facility] Pager: [phone], If no answer or between  15:00h - 7:00h: call [phone]  08/12/2014 10:23  AM

## 2014-08-12 NOTE — Progress Notes (Signed)
Clinical Social Work  CSW faxed DC summary to [facility] and spoke with Camille who reports they are ready to accept patient. CSW prepared DC packet with FL2, DC summary, DNR and hard scripts included. CSW received a call from Katherine Powell (HCPOA per chart) who reports she is no longer HCPOA and that caregiver (Shirley) is now acting HCPOA. Per paperwork from SNF, Shirley is caregiver and HCPOA. CSW met with patient and caregiver at bedside who reports they are excited to be DC today. Patient reports her birthday is next week so she is happy that she will be at home to celebrate. Patient and caregiver request [employer] to provide transportation and is aware of no guarantee of payment. [employer] arranged and request #: [phone].  CSW is signing off but available if needed.   , LCSW [phone] 

## 2014-08-12 NOTE — Discharge Summary (Signed)
Physician Discharge Summary  [name] YNW:[id number] DOB: August 26, 1917 DOA: 08/06/2014  PCP: Mathews Argyle, MD  Admit date: 08/06/2014 Discharge date: 08/12/2014  Time spent:35 minutes  Recommendations for Outpatient Follow-up:  1. Discharge back to SNF ( [facility]).  2. Please check BMET every week while pt is on antibiotics. Patient being discharged on oral levaquin 500 mg daily. Stop date is 09/09/2014. 3. Patient has follow up with pulmonary on 09/02/2014 with repeat CXR  4. Please follow rt middle lobe pulmonary nodule as outpt  Discharge Diagnoses:  Principal Problem:   HCAP (healthcare-associated pneumonia)   Active Problems:  Loculated pleural effusion   Hypertension   Dementia   GERD (gastroesophageal reflux disease)   Lung nodule   Protein-calorie malnutrition, severe     Discharge Condition: fair  Diet recommendation: regular  Filed Weights   08/06/14 1822  Weight: 43.545 kg (96 lb)    History of present illness:  Please refer to admission H&P from 08/07/2015 for details, but in brief, 80 year old female resident of skilled nursing facility with history of GERD, dementia, hypertension, remote history of diverticular bleed presented with shortness of breath and found to have pneumonia with right parapneumonic effusion. Patient placed on empiric antibiotics and pulmonary consult called for further recommendations.   Hospital Course:  Healthcare associated pneumonia with right sided parapneumonic effusion Continue empiric vancomycin and cefepime. Cultures so far negative. Local related pleural effusion on CT scan. Thoracenteses done on 12/11 and 12/14 with drainage of 100 mL and 50 mL pleural fluid. Appears exudative with high LDH and low glucose. but cultures negative. CT also shows 7 mm right middle lobe pulmonary nodule. Pleural fluid cytology negative for malignant cells. -Pulmonary recommends placement of chest tube in an ideal situation but  given the risk involved repeat CT scan of the chest which shows unchanged moderate local related pleural effusion. reevalauted again and after discussing with pt and her caregiver recommend prolonged abx with oral levaquin for 4 weeks and outpt follow up with repeat CXR. Rheumatoid factor and CCP negative.  -Patient afebrile but still has leukocytosis. Maintaining O2 sat on room air. -stable for discharge to SNF with outpt follow up.  Hypertension Stable. Continue amlodipine  Mild-to-moderate dementia Currently stable  GERD Continue PPI  Protein calorie malnutrition, severe Continue supplements  Right middle lobe pulmonary nodule, 7 mm in size Outpatient follow-up   Diet: regular     Code Status: DO NOT RESUSCITATE Family Communication: caregiver  at bedside Disposition Plan: Return to skilled nursing facility    Consultants:  Pulmonary  IR  Procedures:  Right thoracentesis on 12/11 and 12/14  Antibiotics:  IV vancomycin and cefepime since 12/10  Oral levofloxacin 500 mg daily since 12/16 until 09/09/2014  Discharge Exam: Filed Vitals:   08/12/14 0445  BP: 161/84  Pulse: 100  Temp: 98.8 F (37.1 C)  Resp: 16     General: Elderly thin built female in no acute distress  HEENT: No pallor, moist oral mucosa  Chest: Diminished breath sounds over right lung, no crackles, rhonchi or wheeze  CVS: Normal S1 and S2, no murmurs  Abdomen: Soft, nondistended, nontender, bowel sounds present  Extremities: Warm, no edema   CNS: AAO X1-2  Discharge Instructions You were cared for by a hospitalist during your hospital stay. If you have any questions about your discharge medications or the care you received while you were in the hospital after you are discharged, you can call the unit and asked to speak with  the hospitalist on call if the hospitalist that took care of you is not available. Once you are discharged, your primary care physician will handle any  further medical issues. Please note that NO REFILLS for any discharge medications will be authorized once you are discharged, as it is imperative that you return to your primary care physician (or establish a relationship with a primary care physician if you do not have one) for your aftercare needs so that they can reassess your need for medications and monitor your lab values.   Current Discharge Medication List    CONTINUE these medications which have CHANGED   Details  levofloxacin (LEVAQUIN) 500 MG tablet Take 1 tablet (500 mg total) by mouth daily. Qty: 28 tablet, Refills: 0    traMADol (ULTRAM) 50 MG tablet Take 1 tablet (50 mg total) by mouth every 6 (six) hours as needed for moderate pain. Qty: 30 tablet, Refills: 0    zolpidem (AMBIEN) 5 MG tablet Take 1 tablet (5 mg total) by mouth at bedtime. Qty: 10 tablet, Refills: 0      CONTINUE these medications which have NOT CHANGED   Details  acetaminophen (TYLENOL) 500 MG tablet Take 1,000 mg by mouth 4 (four) times daily -  with meals and at bedtime.     amLODipine (NORVASC) 2.5 MG tablet Take 2.5 mg by mouth daily.    beta carotene w/minerals (OCUVITE) tablet Take 1 tablet by mouth 2 (two) times daily.    Calcium Carb-Cholecalciferol (CALCIUM 600 + D PO) Take 1 tablet by mouth 2 (two) times daily.    feeding supplement, ENSURE COMPLETE, (ENSURE COMPLETE) LIQD Take 237 mLs by mouth 3 (three) times daily between meals.    fluticasone (VERAMYST) 27.5 MCG/SPRAY nasal spray Place 1 spray into the nose daily.    hydrocortisone (ANUSOL-HC) 2.5 % rectal cream Place 1 application rectally as needed for hemorrhoids or itching.    moexipril (UNIVASC) 15 MG tablet Take 15 mg by mouth every evening.    omeprazole (PRILOSEC) 20 MG capsule Take 20 mg by mouth daily with lunch.     polyethylene glycol (MIRALAX / GLYCOLAX) packet Take 17 g by mouth every evening.     vitamin B-12 (CYANOCOBALAMIN) 1000 MCG tablet Take 1,000 mcg by mouth  daily.     cetirizine (ZYRTEC) 10 MG tablet Take 10 mg by mouth every morning.       STOP taking these medications     cetirizine-pseudoephedrine (ZYRTEC-D) 5-120 MG per tablet        Allergies  Allergen Reactions  . Celebrex [Celecoxib] Itching  . Morphine And Related Nausea Only  . Vicodin [Hydrocodone-Acetaminophen] Itching  . Sulfa Antibiotics Rash   Follow-up Information    Please follow up.   Why:  will be followed by Dr Nyoka Cowden at [facility]      Follow up with [facility], NP On 09/02/2014.   Specialty:  Nurse Practitioner   Contact information:   [address] [place] [address] [phone]        The results of significant diagnostics from this hospitalization (including imaging, microbiology, ancillary and laboratory) are listed below for reference.    Significant Diagnostic Studies: Dg Chest 1 View  08/10/2014   CLINICAL DATA:  Status post thoracentesis  EXAM: CHEST - 1 VIEW  COMPARISON:  08/07/2014  FINDINGS: No pneumothorax status post thoracentesis.  Layering moderate right pleural effusion. Associated right lower lobe opacity, possibly compressive atelectasis.  Mild left basilar opacity, likely atelectasis.  The  heart is normal in size.  Degenerative changes of the right shoulder with deformity of the humeral head.  IMPRESSION: No pneumothorax status post thoracentesis.  Layering moderate right pleural effusion.   Electronically Signed   By: Julian Hy M.D.   On: 08/10/2014 11:05   Ct Chest Wo Contrast  08/11/2014   CLINICAL DATA:  Loculated pleural effusion, shortness of breath.  EXAM: CT CHEST WITHOUT CONTRAST  TECHNIQUE: Multidetector CT imaging of the chest was performed following the standard protocol without IV contrast.  COMPARISON:  08/06/2014  FINDINGS: The moderate loculated pleural effusion is again noted, minimally changed in overall size. There are locules of air now within the pleural space posteriorly. There has been recent thoracentesis.  These may have been introduced during thoracentesis. This can also be seen with bronchopleural fistula.  Trace left pleural effusion, new since prior study. Airspace disease with air bronchograms in the right lower lobe, similar to prior study, likely compressive atelectasis. Dependent atelectasis posteriorly in the left lower lobe, slightly improved since prior study.  There is cardiomegaly. Densely calcified coronary arteries noted. Tortuosity and ectasia of the thoracic aorta. Small scattered mediastinal lymph nodes, none pathologically enlarged. No axillary adenopathy.  Chest wall soft tissues are unremarkable. Imaging into the upper abdomen shows no acute findings. Gallstones noted within the gallbladder. Calcifications in the spleen compatible with old granulomatous disease.  IMPRESSION: No significant change in the size of the multiloculated right pleural effusion. Locules of air are now present within the pleural space and may be related to recent thoracentesis. Bronchopleural fistula could have this appearance. However, given the recent thoracentesis, this is felt less likely.  Atelectasis in both lower lobes, right greater than left.  New trace left pleural effusion.  Cardiomegaly.  Old granulomatous disease.   Electronically Signed   By: Rolm Baptise M.D.   On: 08/11/2014 15:47   Ct Chest Wo Contrast  08/06/2014   CLINICAL DATA:  Cough. Shortness of breath. Abnormal chest x-ray. Initial encounter.  EXAM: CT CHEST WITHOUT CONTRAST  TECHNIQUE: Multidetector CT imaging of the chest was performed following the standard protocol without IV contrast.  COMPARISON:  Chest radiograph 08/06/2014.  FINDINGS: Evaluation is mildly degraded by respiratory motion artifact.  Musculoskeletal: No aggressive osseous lesions. Likely chronic T6 compression fracture. Loose body is present in the superior SUBSCAPULARIS recess of the RIGHT shoulder. RIGHT rotator cuff muscular atrophy compatible with chronic tear. Severe  RIGHT glenohumeral osteoarthritis. Posterior subluxation of the RIGHT humeral head.  Lungs: Atelectasis is present in the dependent portions of the LEFT lung. There is compressive atelectasis involving the RIGHT middle and RIGHT lower lobes. This is secondary to a moderate RIGHT pleural effusion. Most of the RIGHT lower lobe is collapse with a small portion of the RIGHT middle lobe collapsed. 7 mm pulmonary nodule present in the aerated RIGHT middle lobe (image 35 series 7).  There is a branching area of high density in the superior segment RIGHT lower lobe that suggests embolized bone cement from vertebral augmentation. This could also represent confluent calcifications associated with old granulomatous disease.  Central airways: Extensive calcification of the tracheobronchial tree.  Vasculature: Atherosclerosis. Ascending aortic ectasia, measuring 37 mm. Tortuosity of the thoracic aorta. The heart is displaced to the LEFT. Extensive coronary artery atherosclerosis.  Effusions: Moderate loculated RIGHT pleural effusion at the lung base. This includes both anterior and posterior components and produces compressive atelectasis.  Lymphadenopathy: No axillary adenopathy. No gross mediastinal adenopathy. Hilar adenopathy not evaluated in the  absence of contrast. Old granulomatous disease is present with calcified lymph nodes in the RIGHT hilum.  Esophagus: Normal.  Upper abdomen: Old granulomatous disease of the spleen.  Other: None.  IMPRESSION: 1. Moderate loculated RIGHT pleural effusion, likely producing shortness of breath. This produces substantial atelectasis of the RIGHT lower lobe and partial atelectasis of the RIGHT middle lobe. This could represent a parapneumonic effusion or malignant effusion. Ordinary effusion associated with CHF is also possible. 2. 7 mm RIGHT middle lobe pulmonary nodule. Although follow-up is typically recommended, this is at the discretion of the referring clinician in this patient of  advanced age. Guideline recommendations as follows: If the patient is at high risk for bronchogenic carcinoma, follow-up chest CT at 3-60months is recommended. If the patient is at low risk for bronchogenic carcinoma, follow-up chest CT at 6-12 months is recommended. This recommendation follows the consensus statement: Guidelines for Management of Small Pulmonary Nodules Detected on CT Scans: A Statement from the [employer] as published in Radiology 2005; 237:395-400. 3. Severe atherosclerosis and coronary artery disease with ectasia of the ascending aorta. Again, follow-up is at the discretion of the referring clinician. Guideline recommendations as follows: Ectatic to mildly aneurysmal ascending thoracic aorta. Recommend annual imaging followup by CTA or MRA. This recommendation follows 2010 ACCF/AHA/AATS/ACR/ASA/SCA/SCAI/SIR/STS/SVM Guidelines for the Diagnosis and Management of Patients With Thoracic Aortic Disease. Circulation. 2010; 121: I502-D741   Electronically Signed   By: Dereck Ligas M.D.   On: 08/06/2014 21:06   Dg Chest Port 1 View  08/07/2014   CLINICAL DATA:  80 year old female status post right thoracentesis. Initial encounter.  EXAM: PORTABLE CHEST - 1 VIEW  COMPARISON:  Chest CT without contrast 08/06/2014 and earlier.  FINDINGS: Portable AP semi upright view at 1237 hrs. No pneumothorax identified. Mildly improved lung volumes and mildly regressed opacity at the right lung base. Stable cardiac size and mediastinal contours. Extensive calcified atherosclerosis of the aorta. No new pulmonary opacity identified. Visualized tracheal air column is within normal limits.  IMPRESSION: No pneumothorax identified following right side thoracentesis. Mildly improved ventilation.   Electronically Signed   By: Lars Pinks M.D.   On: 08/07/2014 13:17   Dg Chest Port 1 View  08/06/2014   CLINICAL DATA:  Right-sided chest pain. Shortness of breath. Hypoxia.  EXAM: PORTABLE CHEST - 1 VIEW   COMPARISON:  09/29/2010  FINDINGS: New opacity is seen in the right mid and lower lung, which may be due to infiltrate or mass. Right pleural effusion cannot be excluded.  Left lung is clear.  Heart size is stable.  IMPRESSION: New ill-defined opacity in right mid and lower lung, which may be due to infiltrate or mass. Right pleural effusion cannot be excluded. Recommend continued chest radiographic follow-up or chest CT with contrast.   Electronically Signed   By: Earle Gell M.D.   On: 08/06/2014 18:15   US Thoracentesis Asp Pleural Space W/img Guide  08/10/2014   INDICATION: Symptomatic right sided loculated pleural effusion  EXAM: US THORACENTESIS ASP PLEURAL SPACE W/IMG GUIDE  COMPARISON:  Pulmonology performed bedside.  MEDICATIONS: None  COMPLICATIONS: None immediate  TECHNIQUE: Informed written consent was obtained from the patient's POA after a discussion of the risks, benefits and alternatives to treatment. A timeout was performed prior to the initiation of the procedure.  Initial ultrasound scanning demonstrates a severely loculated right pleural effusion. The lower chest was prepped and draped in the usual sterile fashion. 1% lidocaine was used for local anesthesia.  Under  direct ultrasound guidance, a 19 gauge, 7-cm, Yueh catheter was introduced. An ultrasound image was saved for documentation purposes. The thoracentesis was performed. The catheter was removed and a dressing was applied. The patient tolerated the procedure well without immediate post procedural complication. The patient was escorted to have an upright chest radiograph.  FINDINGS: A total of approximately 9ml of serous fluid was removed, unable to drain any additional fluid secondary to severely loculated effusion. Requested samples were sent to the laboratory.  IMPRESSION: Successful ultrasound-guided right sided thoracentesis yielding 50 ml of pleural fluid.  Read By:  Tsosie Billing PA-C   Electronically Signed   By: Aletta Edouard M.D.   On: 08/10/2014 12:14    Microbiology: Recent Results (from the past 240 hour(s))  Blood Culture (routine x 2)     Status: None   Collection Time: 08/06/14  6:13 PM  Result Value Ref Range Status   Specimen Description BLOOD RIGHT ANTECUBITAL  Final   Special Requests BOTTLES DRAWN AEROBIC AND ANAEROBIC 5ML  Final   Culture  Setup Time   Final    08/06/2014 23:02 Performed at [facility]    Culture   Final    NO GROWTH 5 DAYS Performed at [facility]    Report Status 08/12/2014 FINAL  Final  Blood Culture (routine x 2)     Status: None   Collection Time: 08/06/14  6:34 PM  Result Value Ref Range Status   Specimen Description BLOOD RAC  Final   Special Requests BOTTLES DRAWN AEROBIC AND ANAEROBIC 5ML  Final   Culture  Setup Time   Final    08/06/2014 23:03 Performed at [facility]    Culture   Final    NO GROWTH 5 DAYS Performed at [facility]    Report Status 08/12/2014 FINAL  Final  Urine culture     Status: None   Collection Time: 08/06/14  7:04 PM  Result Value Ref Range Status   Specimen Description URINE, CATHETERIZED  Final   Special Requests NONE  Final   Culture  Setup Time   Final    08/07/2014 01:14 Performed at [place] Performed at [facility]   Final   Culture NO GROWTH Performed at [facility]   Final   Report Status 08/07/2014 FINAL  Final  AFB culture with smear     Status: None (Preliminary result)   Collection Time: 08/07/14 12:34 PM  Result Value Ref Range Status   Specimen Description PLEURAL RIGHT  Final   Special Requests NONE  Final   Acid Fast Smear   Final    NO ACID FAST BACILLI SEEN Performed at [facility]    Culture   Final    CULTURE WILL BE EXAMINED FOR 6 WEEKS BEFORE ISSUING A FINAL REPORT Performed at [facility]    Report Status PENDING  Incomplete  Body fluid culture     Status: None   Collection  Time: 08/07/14 12:34 PM  Result Value Ref Range Status   Specimen Description PLEURAL RIGHT  Final   Special Requests NONE  Final   Gram Stain   Final    RARE WBC PRESENT, PREDOMINANTLY MONONUCLEAR NO ORGANISMS SEEN Performed at [facility]    Culture   Final    NO GROWTH 3 DAYS Performed at [facility]    Report Status 08/11/2014 FINAL  Final  Fungal  stain     Status: None   Collection Time: 08/07/14 12:34 PM  Result Value Ref Range Status   Specimen Description PLEURAL RIGHT  Final   Special Requests NONE  Final   Fungal Smear   Final    NO YEAST OR FUNGAL ELEMENTS SEEN Performed at [facility]    Report Status 08/09/2014 FINAL  Final     Labs: Basic Metabolic Panel:  Recent Labs Lab 08/06/14 1817 08/07/14 0354 08/09/14 1700 08/10/14 0535 08/11/14 0445  NA 128* 131* 135* 137 138  K 4.1 3.8 3.8 3.9 4.0  CL 89* 97 99 103 102  CO2 22 21 21 23 24   GLUCOSE 112* 93 103* 96 89  BUN 34* 27* 16 14 15   CREATININE 0.87 0.76 0.73 0.64 0.66  CALCIUM 9.4 9.0 9.1 9.2 9.3   Liver Function Tests:  Recent Labs Lab 08/06/14 1817 08/07/14 0354 08/07/14 0958  AST 14 14  --   ALT 12 11  --   ALKPHOS 163* 143*  --   BILITOT 0.3 0.3  --   PROT 6.5 5.7* 5.5*  ALBUMIN 2.2* 1.9*  --    No results for input(s): LIPASE, AMYLASE in the last 168 hours. No results for input(s): AMMONIA in the last 168 hours. CBC:  Recent Labs Lab 08/06/14 1817 08/07/14 0354 08/11/14 0445 08/12/14 0433  WBC 17.4* 14.0* 16.6* 16.8*  NEUTROABS 14.4* 10.8*  --   --   HGB 13.3 11.3* 12.6 12.6  HCT 38.8 33.3* 38.1 38.9  MCV 89.6 88.6 90.5 92.2  PLT 482* 532* 671* 642*   Cardiac Enzymes:  Recent Labs Lab 08/06/14 1817 08/06/14 2232 08/07/14 0354 08/07/14 0958  TROPONINI <0.30 <0.30 <0.30 <0.30   BNP: BNP (last 3 results)  Recent Labs  08/07/14 0354  PROBNP 1828.0*   CBG: No results for input(s): GLUCAP in the last 168  hours.     SignedLouellen Molder  [facility] 08/12/2014, 12:13 PM

## 2014-08-13 ENCOUNTER — Non-Acute Institutional Stay (SKILLED_NURSING_FACILITY): Payer: Medicare Other | Admitting: Internal Medicine

## 2014-08-13 DIAGNOSIS — I10 Essential (primary) hypertension: Secondary | ICD-10-CM | POA: Diagnosis not present

## 2014-08-13 DIAGNOSIS — J189 Pneumonia, unspecified organism: Secondary | ICD-10-CM | POA: Diagnosis not present

## 2014-08-13 DIAGNOSIS — R5381 Other malaise: Secondary | ICD-10-CM | POA: Diagnosis not present

## 2014-08-13 DIAGNOSIS — E43 Unspecified severe protein-calorie malnutrition: Secondary | ICD-10-CM

## 2014-08-13 DIAGNOSIS — R911 Solitary pulmonary nodule: Secondary | ICD-10-CM | POA: Diagnosis not present

## 2014-08-13 DIAGNOSIS — F0390 Unspecified dementia without behavioral disturbance: Secondary | ICD-10-CM

## 2014-08-13 DIAGNOSIS — J90 Pleural effusion, not elsewhere classified: Secondary | ICD-10-CM | POA: Diagnosis not present

## 2014-08-18 DIAGNOSIS — R05 Cough: Secondary | ICD-10-CM | POA: Diagnosis not present

## 2014-08-18 DIAGNOSIS — M6281 Muscle weakness (generalized): Secondary | ICD-10-CM | POA: Diagnosis not present

## 2014-08-18 DIAGNOSIS — R270 Ataxia, unspecified: Secondary | ICD-10-CM | POA: Diagnosis not present

## 2014-08-18 DIAGNOSIS — R262 Difficulty in walking, not elsewhere classified: Secondary | ICD-10-CM | POA: Diagnosis not present

## 2014-08-18 DIAGNOSIS — R0600 Dyspnea, unspecified: Secondary | ICD-10-CM | POA: Diagnosis not present

## 2014-08-18 DIAGNOSIS — R0602 Shortness of breath: Secondary | ICD-10-CM | POA: Diagnosis not present

## 2014-08-18 DIAGNOSIS — J918 Pleural effusion in other conditions classified elsewhere: Secondary | ICD-10-CM | POA: Diagnosis not present

## 2014-08-18 DIAGNOSIS — R071 Chest pain on breathing: Secondary | ICD-10-CM | POA: Diagnosis not present

## 2014-08-18 DIAGNOSIS — J189 Pneumonia, unspecified organism: Secondary | ICD-10-CM | POA: Diagnosis not present

## 2014-08-18 DIAGNOSIS — R279 Unspecified lack of coordination: Secondary | ICD-10-CM | POA: Diagnosis not present

## 2014-08-19 DIAGNOSIS — J918 Pleural effusion in other conditions classified elsewhere: Secondary | ICD-10-CM | POA: Diagnosis not present

## 2014-08-19 DIAGNOSIS — R279 Unspecified lack of coordination: Secondary | ICD-10-CM | POA: Diagnosis not present

## 2014-08-19 DIAGNOSIS — M6281 Muscle weakness (generalized): Secondary | ICD-10-CM | POA: Diagnosis not present

## 2014-08-19 DIAGNOSIS — R270 Ataxia, unspecified: Secondary | ICD-10-CM | POA: Diagnosis not present

## 2014-08-19 DIAGNOSIS — R262 Difficulty in walking, not elsewhere classified: Secondary | ICD-10-CM | POA: Diagnosis not present

## 2014-08-19 DIAGNOSIS — J189 Pneumonia, unspecified organism: Secondary | ICD-10-CM | POA: Diagnosis not present

## 2014-08-20 DIAGNOSIS — R262 Difficulty in walking, not elsewhere classified: Secondary | ICD-10-CM | POA: Diagnosis not present

## 2014-08-20 DIAGNOSIS — M6281 Muscle weakness (generalized): Secondary | ICD-10-CM | POA: Diagnosis not present

## 2014-08-20 DIAGNOSIS — R270 Ataxia, unspecified: Secondary | ICD-10-CM | POA: Diagnosis not present

## 2014-08-20 DIAGNOSIS — J189 Pneumonia, unspecified organism: Secondary | ICD-10-CM | POA: Diagnosis not present

## 2014-08-20 DIAGNOSIS — J918 Pleural effusion in other conditions classified elsewhere: Secondary | ICD-10-CM | POA: Diagnosis not present

## 2014-08-20 DIAGNOSIS — R279 Unspecified lack of coordination: Secondary | ICD-10-CM | POA: Diagnosis not present

## 2014-08-24 ENCOUNTER — Non-Acute Institutional Stay (SKILLED_NURSING_FACILITY): Payer: Medicare Other | Admitting: Adult Health

## 2014-08-24 DIAGNOSIS — R279 Unspecified lack of coordination: Secondary | ICD-10-CM | POA: Diagnosis not present

## 2014-08-24 DIAGNOSIS — J918 Pleural effusion in other conditions classified elsewhere: Secondary | ICD-10-CM | POA: Diagnosis not present

## 2014-08-24 DIAGNOSIS — R262 Difficulty in walking, not elsewhere classified: Secondary | ICD-10-CM | POA: Diagnosis not present

## 2014-08-24 DIAGNOSIS — R21 Rash and other nonspecific skin eruption: Secondary | ICD-10-CM

## 2014-08-24 DIAGNOSIS — M6281 Muscle weakness (generalized): Secondary | ICD-10-CM | POA: Diagnosis not present

## 2014-08-24 DIAGNOSIS — J90 Pleural effusion, not elsewhere classified: Secondary | ICD-10-CM | POA: Diagnosis not present

## 2014-08-24 DIAGNOSIS — R270 Ataxia, unspecified: Secondary | ICD-10-CM | POA: Diagnosis not present

## 2014-08-24 DIAGNOSIS — J189 Pneumonia, unspecified organism: Secondary | ICD-10-CM | POA: Diagnosis not present

## 2014-08-24 NOTE — Assessment & Plan Note (Signed)
There is an area of erythema that is painful to the left buttock. The area is blanchable and could be candida or an area of pressure. She is on prolonged antibiotics (Levaquin) due to pna and a pleural effusion (followed by pulmonary).  I have ordered lotrisone to the area BID for 7 days as well as Florastor BID for the course of the antibiotics.

## 2014-08-25 ENCOUNTER — Telehealth: Payer: Self-pay

## 2014-08-25 DIAGNOSIS — R279 Unspecified lack of coordination: Secondary | ICD-10-CM | POA: Diagnosis not present

## 2014-08-25 DIAGNOSIS — M6281 Muscle weakness (generalized): Secondary | ICD-10-CM | POA: Diagnosis not present

## 2014-08-25 DIAGNOSIS — J189 Pneumonia, unspecified organism: Secondary | ICD-10-CM | POA: Diagnosis not present

## 2014-08-25 DIAGNOSIS — R270 Ataxia, unspecified: Secondary | ICD-10-CM | POA: Diagnosis not present

## 2014-08-25 DIAGNOSIS — R05 Cough: Secondary | ICD-10-CM | POA: Diagnosis not present

## 2014-08-25 DIAGNOSIS — J918 Pleural effusion in other conditions classified elsewhere: Secondary | ICD-10-CM | POA: Diagnosis not present

## 2014-08-25 DIAGNOSIS — R262 Difficulty in walking, not elsewhere classified: Secondary | ICD-10-CM | POA: Diagnosis not present

## 2014-08-25 NOTE — Telephone Encounter (Signed)
Called [facility] and spoke with Sam who reported that pt has been having elevated temp up to 100.2 this am and tachycardia up to 110 yesterday.  Their NP Drue Dun recommended pt be seen sooner by pulmonary.  She is currently on therapy for HCAP (Levaquin 500mg ) but also had pleural effusion.  OV scheduled with MW for 12/31 @ 0945.  Sam happy with this appt date/time.    Nothing further needed; will sign off.

## 2014-08-26 DIAGNOSIS — R270 Ataxia, unspecified: Secondary | ICD-10-CM | POA: Diagnosis not present

## 2014-08-26 DIAGNOSIS — J918 Pleural effusion in other conditions classified elsewhere: Secondary | ICD-10-CM | POA: Diagnosis not present

## 2014-08-26 DIAGNOSIS — R262 Difficulty in walking, not elsewhere classified: Secondary | ICD-10-CM | POA: Diagnosis not present

## 2014-08-26 DIAGNOSIS — M6281 Muscle weakness (generalized): Secondary | ICD-10-CM | POA: Diagnosis not present

## 2014-08-26 DIAGNOSIS — R279 Unspecified lack of coordination: Secondary | ICD-10-CM | POA: Diagnosis not present

## 2014-08-26 DIAGNOSIS — J189 Pneumonia, unspecified organism: Secondary | ICD-10-CM | POA: Diagnosis not present

## 2014-08-27 ENCOUNTER — Other Ambulatory Visit (INDEPENDENT_AMBULATORY_CARE_PROVIDER_SITE_OTHER): Payer: Medicare Other

## 2014-08-27 ENCOUNTER — Encounter: Payer: Self-pay | Admitting: Internal Medicine

## 2014-08-27 ENCOUNTER — Ambulatory Visit (INDEPENDENT_AMBULATORY_CARE_PROVIDER_SITE_OTHER): Payer: Medicare Other | Admitting: Internal Medicine

## 2014-08-27 ENCOUNTER — Ambulatory Visit (INDEPENDENT_AMBULATORY_CARE_PROVIDER_SITE_OTHER)
Admission: RE | Admit: 2014-08-27 | Discharge: 2014-08-27 | Disposition: A | Discharge status: Home / Self Care | Payer: Medicare Other | Source: Ambulatory Visit | Attending: Internal Medicine | Admitting: Internal Medicine

## 2014-08-27 VITALS — BP 110/74 | HR 109 | Temp 98.7°F

## 2014-08-27 DIAGNOSIS — J918 Pleural effusion in other conditions classified elsewhere: Secondary | ICD-10-CM | POA: Diagnosis not present

## 2014-08-27 DIAGNOSIS — R0600 Dyspnea, unspecified: Secondary | ICD-10-CM

## 2014-08-27 DIAGNOSIS — R279 Unspecified lack of coordination: Secondary | ICD-10-CM | POA: Diagnosis not present

## 2014-08-27 DIAGNOSIS — J90 Pleural effusion, not elsewhere classified: Secondary | ICD-10-CM

## 2014-08-27 DIAGNOSIS — I10 Essential (primary) hypertension: Secondary | ICD-10-CM

## 2014-08-27 DIAGNOSIS — R0602 Shortness of breath: Secondary | ICD-10-CM | POA: Diagnosis not present

## 2014-08-27 DIAGNOSIS — Z79899 Other long term (current) drug therapy: Secondary | ICD-10-CM | POA: Diagnosis not present

## 2014-08-27 DIAGNOSIS — R270 Ataxia, unspecified: Secondary | ICD-10-CM | POA: Diagnosis not present

## 2014-08-27 DIAGNOSIS — R262 Difficulty in walking, not elsewhere classified: Secondary | ICD-10-CM | POA: Diagnosis not present

## 2014-08-27 DIAGNOSIS — J189 Pneumonia, unspecified organism: Secondary | ICD-10-CM | POA: Diagnosis not present

## 2014-08-27 DIAGNOSIS — M6281 Muscle weakness (generalized): Secondary | ICD-10-CM | POA: Diagnosis not present

## 2014-08-27 LAB — BRAIN NATRIURETIC PEPTIDE: PRO B NATRI PEPTIDE: 180 pg/mL — AB (ref 0.0–100.0)

## 2014-08-27 LAB — CBC WITH DIFFERENTIAL/PLATELET
Basophils Absolute: 0.1 10*3/uL (ref 0.0–0.1)
Basophils Relative: 0.4 % (ref 0.0–3.0)
EOS PCT: 0.3 % (ref 0.0–5.0)
Eosinophils Absolute: 0.1 10*3/uL (ref 0.0–0.7)
HEMATOCRIT: 38.8 % (ref 36.0–46.0)
HEMOGLOBIN: 12.6 g/dL (ref 12.0–15.0)
Lymphocytes Relative: 14.8 % (ref 12.0–46.0)
Lymphs Abs: 2.1 10*3/uL (ref 0.7–4.0)
MCHC: 32.4 g/dL (ref 30.0–36.0)
MCV: 91.3 fl (ref 78.0–100.0)
MONOS PCT: 7.9 % (ref 3.0–12.0)
Monocytes Absolute: 1.1 10*3/uL — ABNORMAL HIGH (ref 0.1–1.0)
NEUTROS ABS: 11.1 10*3/uL — AB (ref 1.4–7.7)
Neutrophils Relative %: 76.6 % (ref 43.0–77.0)
Platelets: 740 10*3/uL — ABNORMAL HIGH (ref 150.0–400.0)
RBC: 4.25 Mil/uL (ref 3.87–5.11)
RDW: 14.5 % (ref 11.5–15.5)
WBC: 14.4 10*3/uL — AB (ref 4.0–10.5)

## 2014-08-27 LAB — BASIC METABOLIC PANEL
BUN: 10 mg/dL (ref 6–23)
CHLORIDE: 99 meq/L (ref 96–112)
CO2: 24 mEq/L (ref 19–32)
Calcium: 9.4 mg/dL (ref 8.4–10.5)
Creatinine, Ser: 0.9 mg/dL (ref 0.4–1.2)
GFR: 65.16 mL/min (ref >60.00)
Glucose, Bld: 117 mg/dL — ABNORMAL HIGH (ref 70–99)
Potassium: 3.7 mEq/L (ref 3.5–5.1)
Sodium: 133 mEq/L — ABNORMAL LOW (ref 135–145)

## 2014-08-27 LAB — HEPATIC FUNCTION PANEL
ALK PHOS: 125 U/L — AB (ref 39–117)
ALT: 11 U/L (ref 0–35)
AST: 20 U/L (ref 0–37)
Albumin: 2.7 g/dL — ABNORMAL LOW (ref 3.5–5.2)
BILIRUBIN DIRECT: 0.1 mg/dL (ref 0.0–0.3)
TOTAL PROTEIN: 6.6 g/dL (ref 6.0–8.3)
Total Bilirubin: 0.3 mg/dL (ref 0.2–1.2)

## 2014-08-27 LAB — TSH: TSH: 2.86 u[IU]/mL (ref 0.35–4.50)

## 2014-08-27 LAB — SEDIMENTATION RATE: SED RATE: 80 mm/h — AB (ref 0–22)

## 2014-08-27 NOTE — Patient Instructions (Signed)
Stop norvasc for now as blood pressure running a little low and may explain some of your fast heart rate issues   Please remember to go to the lab and x-ray department downstairs for your tests - we will call you with the results when they are available.  Please schedule a follow up office visit in 2 weeks, sooner if needed

## 2014-08-27 NOTE — Progress Notes (Signed)
Patient ID: [name], female   DOB: [birth year], 80 y.o.   MRN: [id number]  Nursing Home Location:  [facility]: SNF [phone])  Chief Complaint  Patient presents with  . Acute Visit    erythema to buttocks, low grade fever    HPI:  80 y.o. female residing at [facility], skilled care section. I was asked to see her today due to intermittent low grade fevers, HR 110, and sats of 90%.  She was admitted to the hospital 08/06/14-08/12/14 due to a right loculated pleural effusion and HCAP. Thoracenteses was done on 12/11 and 12/14 with drainage of 100 mL and 50 mL pleural fluid. Appears exudative with high LDH and low glucose. but cultures negative. CT also shows 7 mm right middle lobe pulmonary nodule. Pleural fluid cytology negative for malignant cells. Pulmonary recommended placement of chest tube in an ideal situation but given the risk involved repeat CT scan of the chest which shows unchanged moderate local related pleural effusion. reevalauted again and after discussing with pt and her caregiver, they recommended prolonged abx with oral levaquin for 4 weeks and outpt follow up with repeat CXR. Today she denies CP, SOB, or DOE.  She mostly complains of pain to her left buttock. The nurse reports there is a rash there that has been bothersome.  Review of Systems:  Review of Systems  Constitutional: Positive for fever. Negative for chills, diaphoresis, activity change, appetite change, fatigue and unexpected weight change.  HENT: Negative for congestion, postnasal drip and trouble swallowing.   Respiratory: Negative for apnea, cough, shortness of breath and wheezing.   Cardiovascular: Negative for chest pain, palpitations and leg swelling.  Genitourinary: Negative for dysuria, flank pain and difficulty urinating.  Musculoskeletal: Positive for arthralgias. Negative for back pain and joint swelling.  Skin: Positive for rash.  Negative for color change and wound.  Neurological: Negative for dizziness, seizures, speech difficulty and numbness.  Psychiatric/Behavioral: Negative for confusion and agitation.    Medications: Patient's Medications  New Prescriptions   No medications on file  Previous Medications   ACETAMINOPHEN (TYLENOL) 500 MG TABLET    Take 1,000 mg by mouth 4 (four) times daily -  with meals and at bedtime.    AMLODIPINE (NORVASC) 2.5 MG TABLET    Take 2.5 mg by mouth daily.   BETA CAROTENE W/MINERALS (OCUVITE) TABLET    Take 1 tablet by mouth 2 (two) times daily.   CALCIUM CARB-CHOLECALCIFEROL (CALCIUM 600 + D PO)    Take 1 tablet by mouth 2 (two) times daily.   CETIRIZINE (ZYRTEC) 10 MG TABLET    Take 10 mg by mouth every morning.    FEEDING SUPPLEMENT, ENSURE COMPLETE, (ENSURE COMPLETE) LIQD    Take 237 mLs by mouth 3 (three) times daily between meals.   FLUTICASONE (VERAMYST) 27.5 MCG/SPRAY NASAL SPRAY    Place 1 spray into the nose daily.   HYDROCORTISONE (ANUSOL-HC) 2.5 % RECTAL CREAM    Place 1 application rectally as needed for hemorrhoids or itching.   LEVOFLOXACIN (LEVAQUIN) 500 MG TABLET    Take 1 tablet (500 mg total) by mouth daily.   MOEXIPRIL (UNIVASC) 15 MG TABLET    Take 15 mg by mouth every evening.   OMEPRAZOLE (PRILOSEC) 20 MG CAPSULE    Take 20 mg by mouth daily with lunch.    POLYETHYLENE GLYCOL (MIRALAX / GLYCOLAX) PACKET    Take 17 g by mouth every evening.  SACCHAROMYCES BOULARDII (FLORASTOR) 250 MG CAPSULE    Take 250 mg by mouth 2 (two) times daily.   TRAMADOL (ULTRAM) 50 MG TABLET    Take 1 tablet (50 mg total) by mouth every 6 (six) hours as needed for moderate pain.   VITAMIN B-12 (CYANOCOBALAMIN) 1000 MCG TABLET    Take 1,000 mcg by mouth daily.    ZOLPIDEM (AMBIEN) 5 MG TABLET    Take 1 tablet (5 mg total) by mouth at bedtime.  Modified Medications   No medications on file  Discontinued Medications   No medications on file     Physical Exam:  Filed Vitals:     08/24/14 1603  BP: 148/87  Pulse: 110  Temp: 100 F (37.8 C)  Resp: 20  SpO2: 90%    Physical Exam  Constitutional: No distress.  Frail  HENT:  Head: Normocephalic and atraumatic.  Neck: No JVD present. No tracheal deviation present.  Cardiovascular: Normal rate, regular rhythm, normal heart sounds and intact distal pulses.   No murmur heard. Pulmonary/Chest: Effort normal and breath sounds normal. No respiratory distress. She has no wheezes. She has no rales.  Crackles in the bases, decreased throughout. Mildly distressed due to pain. No bruising or deformity to the area  Abdominal: Soft. Bowel sounds are normal. She exhibits no distension. There is no tenderness.  No CVA tenderness  Musculoskeletal: She exhibits no edema or tenderness.  Lymphadenopathy:    She has no cervical adenopathy.  Neurological: She is alert. No cranial nerve deficit.  Oriented x2  Skin: Skin is warm and dry. Rash noted. She is not diaphoretic. No erythema.  Erythema noted to left buttock  Psychiatric: Affect normal.    Labs reviewed/Significant Diagnostic Results:  CT of the chest 08/11/14 IMPRESSION: No significant change in the size of the multiloculated right pleural effusion. Locules of air are now present within the pleural space and may be related to recent thoracentesis. Bronchopleural fistula could have this appearance. However, given the recent thoracentesis, this is felt less likely. Atelectasis in both lower lobes, right greater than left. New trace left pleural effusion. Cardiomegaly. Old granulomatous disease.  CT of the chest 07/1014 IMPRESSION: 1. Moderate loculated RIGHT pleural effusion, likely producing shortness of breath. This produces substantial atelectasis of the RIGHT lower lobe and partial atelectasis of the RIGHT middle lobe. This could represent a parapneumonic effusion or malignant effusion. Ordinary effusion associated with CHF is also possible. 2. 7 mm  RIGHT middle lobe pulmonary nodule. Although follow-up is typically recommended, this is at the discretion of the referring clinician in this patient of advanced age. Guideline recommendations as follows: If the patient is at high risk for bronchogenic carcinoma, follow-up chest CT at 3-28months is recommended. If the patient is at low risk for bronchogenic carcinoma, follow-up chest CT at 6-12 months is recommended. This recommendation follows the consensus statement: Guidelines for Management of Small Pulmonary Nodules Detected on CT Scans: A Statement from the [place] as published in Radiology 2005; 237:395-400. 3. Severe atherosclerosis and coronary artery disease with ectasia of the ascending aorta. Again, follow-up is at the discretion of the referring clinician.  Basic Metabolic Panel:  Recent Labs  08/09/14 1700 08/10/14 0535 08/11/14 0445  NA 135* 137 138  K 3.8 3.9 4.0  CL 99 103 102  CO2 21 23 24   GLUCOSE 103* 96 89  BUN 16 14 15   CREATININE 0.73 0.64 0.66  CALCIUM 9.1 9.2 9.3   Liver Function Tests:  Recent  Labs  08/06/14 1817 08/07/14 0354 08/07/14 0958  AST 14 14  --   ALT 12 11  --   ALKPHOS 163* 143*  --   BILITOT 0.3 0.3  --   PROT 6.5 5.7* 5.5*  ALBUMIN 2.2* 1.9*  --    No results for input(s): LIPASE, AMYLASE in the last 8760 hours. No results for input(s): AMMONIA in the last 8760 hours. CBC:  Recent Labs  08/06/14 1817 08/07/14 0354 08/11/14 0445 08/12/14 0433  WBC 17.4* 14.0* 16.6* 16.8*  NEUTROABS 14.4* 10.8*  --   --   HGB 13.3 11.3* 12.6 12.6  HCT 38.8 33.3* 38.1 38.9  MCV 89.6 88.6 90.5 92.2  PLT 482* 532* 671* 642*     Assessment/Plan  Rash and nonspecific skin eruption There is an area of erythema that is painful to the left buttock. The area is blanchable and could be candida or an area of pressure. She is on prolonged antibiotics (Levaquin) due to pna and a pleural effusion (followed by pulmonary).  I have  ordered lotrisone to the area BID for 7 days as well as Florastor BID for the course of the antibiotics.  Pleural effusion F/U with pulmonary to evaluate the need for thoracentesis due to intermittent fevers, borderline 02 sats, and tachycardia   Cindi Carbon, [place] [phone]

## 2014-08-27 NOTE — Progress Notes (Signed)
Subjective:     Patient ID: [name], female   DOB: [birth year],    MRN: [id number]  HPI  80 yowf [facility] resident quit smoking 1970s  s/p admit to [facility]  Admit date: 08/06/2014 Discharge date: 08/12/2014  Recommendations for Outpatient Follow-up:  1. Discharge back to SNF ( [facility]).  2. Please check BMET every week while pt is on antibiotics. Patient being discharged on oral levaquin 500 mg daily. Stop date is 09/09/2014. 3. Patient has follow up with pulmonary on 09/02/2014 with repeat CXR  4. Please follow rt middle lobe pulmonary nodule as outpt  Discharge Diagnoses:  Principal Problem:  HCAP (healthcare-associated pneumonia)   Loculated pleural effusion  Hypertension  Dementia  GERD (gastroesophageal reflux disease)  Lung nodule  Protein-calorie malnutrition, severe  History of present illness:  Please refer to admission H&P from 08/07/2015 for details, but in brief, 80 year old female resident of skilled nursing facility with history of GERD, dementia, hypertension, remote history of diverticular bleed presented with shortness of breath and found to have pneumonia with right parapneumonic effusion. Patient placed on empiric antibiotics and pulmonary consult called for further recommendations.   Hospital Course:  Healthcare associated pneumonia with right sided parapneumonic effusion Continue empiric vancomycin and cefepime. Cultures so far negative. Local related pleural effusion on CT scan. Thoracenteses done on 12/11 and 12/14 with drainage of 100 mL and 50 mL pleural fluid. Appears exudative with high LDH and low glucose. but cultures negative. CT also shows 7 mm right middle lobe pulmonary nodule. Pleural fluid cytology negative for malignant cells. -Pulmonary recommends placement of chest tube in an ideal situation but given the risk involved repeat CT scan of the chest which shows unchanged moderate local related pleural effusion. reevalauted  again and after discussing with pt and her caregiver recommend prolonged abx with oral levaquin for 4 weeks and outpt follow up with repeat CXR. Rheumatoid factor and CCP negative.  -Patient afebrile but still has leukocytosis. Maintaining O2 sat on room air. -stable for discharge to SNF with outpt follow up.  Hypertension Stable. Continue amlodipine  Mild-to-moderate dementia Currently stable  GERD Continue PPI  Protein calorie malnutrition, severe Continue supplements  Right middle lobe pulmonary nodule, 7 mm in size Outpatient follow-up   08/27/2014 1st [facility] office visit/ Melvyn Novas / consultation requested by Dr T Reed/ [name] via Oralia Manis, NP  Chief Complaint  Patient presents with  . HFU    Pt c/o low grade fever for the past 3-4 days. She denies any SOB, CP, cough or other co's today.   per notes, low grade fever s spikes/ rigors and po intake reduced but adequate s frank asp noted and patient able to lie flat per caretaker and POA Enid Derry who is with her today.   No obvious patterns in day to day or daytime variabilty or assoc chronic cough or cp or chest tightness, subjective wheeze overt sinus or hb symptoms. No unusual exp hx or h/o childhood pna/ asthma or knowledge of premature birth.  Sleeping ok without nocturnal  or early am exacerbation  of respiratory  c/o's or need for noct saba. Also denies any obvious fluctuation of symptoms with weather or environmental changes or other aggravating or alleviating factors except as outlined above   Current Medications, Allergies, Complete Past Medical History, Past Surgical History, Family History, and Social History were reviewed in [employer] record.  ROS  The following are not active complaints unless bolded sore throat, dysphagia, dental  problems, itching, sneezing,  nasal congestion or excess/ purulent secretions, ear ache,   fever, chills, sweats, unintended wt loss, pleuritic or exertional  cp, hemoptysis,  orthopnea pnd or leg swelling, presyncope, palpitations, heartburn, abdominal pain, anorexia, nausea, vomiting, diarrhea  or change in bowel or urinary habits, change in stools or urine, dysuria,hematuria,  rash, arthralgias, visual complaints, headache, numbness weakness or ataxia or problems with walking or coordination,  change in mood/affect or memory.              Review of Systems     Objective:   Physical Exam   Elderly wf w/c bound, very poor recall of recent events/ very hard of hearing  Wt Readings from Last 3 Encounters:  08/06/14 96 lb (43.545 kg)  03/12/14 97 lb (43.999 kg)  10/08/13 96 lb 3.2 oz (43.636 kg)    Vital signs reviewed  HEENT: nl dentition, turbinates, and orophanx. Nl external ear canals without cough reflex   NECK :  without JVD/Nodes/TM/ nl carotid upstrokes bilaterally   LUNGS: no acc muscle use, decreased bs R base posteriorly s bronchial changes and  without cough on insp or exp maneuvers   CV:  RRR  no s3 or murmur or increase in P2, no edema   ABD:  soft and nontender with nl excursion  No bruits or organomegaly, bowel sounds nl  MS:  warm without deformities, calf tenderness, cyanosis or clubbing  SKIN: warm and dry without lesions    NEURO:  alert, approp, no deficits    CXR PA and Lateral:   08/27/2014 :  1. Persistent infiltrate right lower lobe. 2. Persistent right pleural effusion.   Recent Labs Lab 08/27/14 1022  NA 133*  K 3.7  CL 99  CO2 24  BUN 10  CREATININE 0.9  GLUCOSE 117*    Recent Labs Lab 08/27/14 1022  HGB 12.6  HCT 38.8  WBC 14.4*  PLT 740.0*  Vs > 16k 2 weeks prior to OV     Lab Results  Component Value Date   TSH 2.86 08/27/2014     Lab Results  Component Value Date   PROBNP 180.0* 08/27/2014     Lab Results  Component Value Date   ESRSEDRATE 80* 08/27/2014         Assessment:

## 2014-08-27 NOTE — Assessment & Plan Note (Addendum)
She appears stable at this time but I am concerned that there is an accumulation of fluid in the lung that is infectious in nature, given her recent hx.  She will need to F/U with pulmonary ASAP to evaluate the need for thoracentesis due to intermittent fevers, borderline 02 sats, and tachycardia. Continue the current course of Levaquin for now.

## 2014-08-27 NOTE — Assessment & Plan Note (Addendum)
CT scan 08/06/14  mod loculated right pleural effusion, 40m right ML pulmonary nodule Right thoracentesis 12/11: drained about 100 ml. Still had fluid seemingly loculated to right.  -LDH 12/11>   926  And Protein 12/11  4.3  C/w exudate Cytology 12/11> inflammatory  Cell counts 12/11 >  82% PMN of total 1400 WBC, Haszy  culture 12/11> wbc's, no org seen, NG Fungal 12/11> neg smear >>> AFB 12/11> neg smear >>> R thoracentesis 12/14:  50 cc - Glucose  40 with pH7.50  - WBC  415 and 91% polys -  Cytology acute inflammation  ESR 80 08/27/2014 and no change on CXR  bnp minimally elevated and of course chf does not cause exudative inflammatory effusions so this is almost certainly a complicated R parapneumonic effusion and have not excluded frank empyema as we have not likely accessed all the pockets yet.   I had an extended discussion with the patient and POA reviewing all relevant studies completed to date :  She has a small loculated process that does risk SIRS/sepsis but the collections of fluid are septated and the total vol is not enough to cause a significant ventilatory issue and as result has ok gas exchange and notes no sob   Would complete abx as planned and return in 2 weeks for cxr unless condition deteriorates in which case we could try a pigtail via IR with thrombolitics but poor candidate for vats so hope it won't come to that decision/ note NCB status.

## 2014-08-27 NOTE — Assessment & Plan Note (Addendum)
D/c 'd amlodipine  08/27/2014 due to low bp and relative tachycardia but note also on acei and depending on po intake level of SIRS from effusion may need to taper or even d/c this as well

## 2014-08-28 DIAGNOSIS — R262 Difficulty in walking, not elsewhere classified: Secondary | ICD-10-CM | POA: Diagnosis not present

## 2014-08-28 DIAGNOSIS — R270 Ataxia, unspecified: Secondary | ICD-10-CM | POA: Diagnosis not present

## 2014-08-28 DIAGNOSIS — R0600 Dyspnea, unspecified: Secondary | ICD-10-CM | POA: Diagnosis not present

## 2014-08-28 DIAGNOSIS — R071 Chest pain on breathing: Secondary | ICD-10-CM | POA: Diagnosis not present

## 2014-08-28 DIAGNOSIS — J918 Pleural effusion in other conditions classified elsewhere: Secondary | ICD-10-CM | POA: Diagnosis not present

## 2014-08-28 DIAGNOSIS — R0602 Shortness of breath: Secondary | ICD-10-CM | POA: Diagnosis not present

## 2014-08-28 DIAGNOSIS — M6281 Muscle weakness (generalized): Secondary | ICD-10-CM | POA: Diagnosis not present

## 2014-08-28 DIAGNOSIS — R279 Unspecified lack of coordination: Secondary | ICD-10-CM | POA: Diagnosis not present

## 2014-08-28 DIAGNOSIS — J189 Pneumonia, unspecified organism: Secondary | ICD-10-CM | POA: Diagnosis not present

## 2014-08-31 ENCOUNTER — Inpatient Hospital Stay: Payer: Medicare Other | Admitting: Adult Health

## 2014-08-31 DIAGNOSIS — M6281 Muscle weakness (generalized): Secondary | ICD-10-CM | POA: Diagnosis not present

## 2014-08-31 DIAGNOSIS — R262 Difficulty in walking, not elsewhere classified: Secondary | ICD-10-CM | POA: Diagnosis not present

## 2014-08-31 DIAGNOSIS — J918 Pleural effusion in other conditions classified elsewhere: Secondary | ICD-10-CM | POA: Diagnosis not present

## 2014-08-31 DIAGNOSIS — J189 Pneumonia, unspecified organism: Secondary | ICD-10-CM | POA: Diagnosis not present

## 2014-08-31 DIAGNOSIS — R279 Unspecified lack of coordination: Secondary | ICD-10-CM | POA: Diagnosis not present

## 2014-08-31 DIAGNOSIS — R270 Ataxia, unspecified: Secondary | ICD-10-CM | POA: Diagnosis not present

## 2014-09-01 DIAGNOSIS — R262 Difficulty in walking, not elsewhere classified: Secondary | ICD-10-CM | POA: Diagnosis not present

## 2014-09-01 DIAGNOSIS — J918 Pleural effusion in other conditions classified elsewhere: Secondary | ICD-10-CM | POA: Diagnosis not present

## 2014-09-01 DIAGNOSIS — R270 Ataxia, unspecified: Secondary | ICD-10-CM | POA: Diagnosis not present

## 2014-09-01 DIAGNOSIS — R279 Unspecified lack of coordination: Secondary | ICD-10-CM | POA: Diagnosis not present

## 2014-09-01 DIAGNOSIS — R05 Cough: Secondary | ICD-10-CM | POA: Diagnosis not present

## 2014-09-01 DIAGNOSIS — M6281 Muscle weakness (generalized): Secondary | ICD-10-CM | POA: Diagnosis not present

## 2014-09-01 DIAGNOSIS — J189 Pneumonia, unspecified organism: Secondary | ICD-10-CM | POA: Diagnosis not present

## 2014-09-02 DIAGNOSIS — J918 Pleural effusion in other conditions classified elsewhere: Secondary | ICD-10-CM | POA: Diagnosis not present

## 2014-09-02 DIAGNOSIS — R270 Ataxia, unspecified: Secondary | ICD-10-CM | POA: Diagnosis not present

## 2014-09-02 DIAGNOSIS — R262 Difficulty in walking, not elsewhere classified: Secondary | ICD-10-CM | POA: Diagnosis not present

## 2014-09-02 DIAGNOSIS — R279 Unspecified lack of coordination: Secondary | ICD-10-CM | POA: Diagnosis not present

## 2014-09-02 DIAGNOSIS — J189 Pneumonia, unspecified organism: Secondary | ICD-10-CM | POA: Diagnosis not present

## 2014-09-02 DIAGNOSIS — M6281 Muscle weakness (generalized): Secondary | ICD-10-CM | POA: Diagnosis not present

## 2014-09-03 DIAGNOSIS — R279 Unspecified lack of coordination: Secondary | ICD-10-CM | POA: Diagnosis not present

## 2014-09-03 DIAGNOSIS — J918 Pleural effusion in other conditions classified elsewhere: Secondary | ICD-10-CM | POA: Diagnosis not present

## 2014-09-03 DIAGNOSIS — R270 Ataxia, unspecified: Secondary | ICD-10-CM | POA: Diagnosis not present

## 2014-09-03 DIAGNOSIS — M6281 Muscle weakness (generalized): Secondary | ICD-10-CM | POA: Diagnosis not present

## 2014-09-03 DIAGNOSIS — R262 Difficulty in walking, not elsewhere classified: Secondary | ICD-10-CM | POA: Diagnosis not present

## 2014-09-03 DIAGNOSIS — J189 Pneumonia, unspecified organism: Secondary | ICD-10-CM | POA: Diagnosis not present

## 2014-09-04 DIAGNOSIS — J189 Pneumonia, unspecified organism: Secondary | ICD-10-CM | POA: Diagnosis not present

## 2014-09-04 DIAGNOSIS — J918 Pleural effusion in other conditions classified elsewhere: Secondary | ICD-10-CM | POA: Diagnosis not present

## 2014-09-04 DIAGNOSIS — R262 Difficulty in walking, not elsewhere classified: Secondary | ICD-10-CM | POA: Diagnosis not present

## 2014-09-04 DIAGNOSIS — R279 Unspecified lack of coordination: Secondary | ICD-10-CM | POA: Diagnosis not present

## 2014-09-04 DIAGNOSIS — R270 Ataxia, unspecified: Secondary | ICD-10-CM | POA: Diagnosis not present

## 2014-09-04 DIAGNOSIS — M6281 Muscle weakness (generalized): Secondary | ICD-10-CM | POA: Diagnosis not present

## 2014-09-07 DIAGNOSIS — M6281 Muscle weakness (generalized): Secondary | ICD-10-CM | POA: Diagnosis not present

## 2014-09-07 DIAGNOSIS — R270 Ataxia, unspecified: Secondary | ICD-10-CM | POA: Diagnosis not present

## 2014-09-07 DIAGNOSIS — J918 Pleural effusion in other conditions classified elsewhere: Secondary | ICD-10-CM | POA: Diagnosis not present

## 2014-09-07 DIAGNOSIS — R279 Unspecified lack of coordination: Secondary | ICD-10-CM | POA: Diagnosis not present

## 2014-09-07 DIAGNOSIS — R262 Difficulty in walking, not elsewhere classified: Secondary | ICD-10-CM | POA: Diagnosis not present

## 2014-09-07 DIAGNOSIS — J189 Pneumonia, unspecified organism: Secondary | ICD-10-CM | POA: Diagnosis not present

## 2014-09-08 ENCOUNTER — Non-Acute Institutional Stay (SKILLED_NURSING_FACILITY): Payer: Medicare Other | Admitting: Adult Health

## 2014-09-08 ENCOUNTER — Encounter: Payer: Self-pay | Admitting: Adult Health

## 2014-09-08 DIAGNOSIS — I471 Supraventricular tachycardia: Secondary | ICD-10-CM | POA: Diagnosis not present

## 2014-09-08 DIAGNOSIS — R279 Unspecified lack of coordination: Secondary | ICD-10-CM | POA: Diagnosis not present

## 2014-09-08 DIAGNOSIS — I10 Essential (primary) hypertension: Secondary | ICD-10-CM

## 2014-09-08 DIAGNOSIS — R5381 Other malaise: Secondary | ICD-10-CM | POA: Diagnosis not present

## 2014-09-08 DIAGNOSIS — M546 Pain in thoracic spine: Secondary | ICD-10-CM | POA: Diagnosis not present

## 2014-09-08 DIAGNOSIS — R270 Ataxia, unspecified: Secondary | ICD-10-CM | POA: Diagnosis not present

## 2014-09-08 DIAGNOSIS — M6281 Muscle weakness (generalized): Secondary | ICD-10-CM | POA: Diagnosis not present

## 2014-09-08 DIAGNOSIS — F0390 Unspecified dementia without behavioral disturbance: Secondary | ICD-10-CM

## 2014-09-08 DIAGNOSIS — J189 Pneumonia, unspecified organism: Secondary | ICD-10-CM | POA: Diagnosis not present

## 2014-09-08 DIAGNOSIS — K2100 Gastro-esophageal reflux disease with esophagitis, without bleeding: Secondary | ICD-10-CM

## 2014-09-08 DIAGNOSIS — R000 Tachycardia, unspecified: Secondary | ICD-10-CM

## 2014-09-08 DIAGNOSIS — E538 Deficiency of other specified B group vitamins: Secondary | ICD-10-CM

## 2014-09-08 DIAGNOSIS — R262 Difficulty in walking, not elsewhere classified: Secondary | ICD-10-CM | POA: Diagnosis not present

## 2014-09-08 DIAGNOSIS — J918 Pleural effusion in other conditions classified elsewhere: Secondary | ICD-10-CM | POA: Diagnosis not present

## 2014-09-08 DIAGNOSIS — M545 Low back pain, unspecified: Secondary | ICD-10-CM

## 2014-09-08 DIAGNOSIS — R05 Cough: Secondary | ICD-10-CM | POA: Diagnosis not present

## 2014-09-08 DIAGNOSIS — J90 Pleural effusion, not elsewhere classified: Secondary | ICD-10-CM | POA: Diagnosis not present

## 2014-09-08 LAB — BASIC METABOLIC PANEL
BUN: 12 mg/dL (ref 4–21)
CREATININE: 0.6 mg/dL (ref 0.5–1.1)
Glucose: 79 mg/dL
POTASSIUM: 4.3 mmol/L (ref 3.4–5.3)
SODIUM: 136 mmol/L — AB (ref 137–147)

## 2014-09-08 NOTE — Assessment & Plan Note (Addendum)
No improvement in tachycardia (103-11) with discontinuation of Norvasc. BP elevated  Range 125-165/77-96.  Currently on Univasc 15 mg daily. Add Diltazem.

## 2014-09-08 NOTE — Progress Notes (Signed)
Patient ID: [name], female   DOB: February 26, 1917, 81 y.o.   MRN: [id number] Later resident BP dropped to the 43'H systolic so we decided not to initiate the cardizem and hold the univasc for sys BP <100

## 2014-09-08 NOTE — Assessment & Plan Note (Signed)
Weaker over time. PT to eval and tx

## 2014-09-08 NOTE — Assessment & Plan Note (Signed)
Xray performed to thoracic and lumbar area after a fall with reported back pain. Ultram ordered prn for pain. Using heating pad. Await results.

## 2014-09-08 NOTE — Progress Notes (Signed)
Patient ID: [name], female   DOB: 05-Oct-1916, 81 y.o.   MRN: [id number]     Nursing Home Location:  [place]: SNF [phone])  Chief Complaint  Patient presents with  . Medical Management of Chronic Issues    HPI:  81 y.o. female residing at [facility], skilled care section. I am her to review her chronic diseases.  The staff reported that she fell on 09/07/14 and since then has complained of low back pain. An xray of the thoracic and lumbar area is pending. She reports feeling sore all over and having low back pain today. She is using a heating pad with some relief.  She was admitted to the hospital 08/06/14-08/12/14 due to a right loculated pleural effusion and HCAP. Thoracenteses was done on 12/11 and 12/14 with drainage of 100 mL and 50 mL pleural fluid. Appears exudative with high LDH and low glucose. but cultures negative. CT also shows 7 mm right middle lobe pulmonary nodule. Pleural fluid cytology negative for malignant cells. She will complete a 4 week course of Levaquin on 09/09/14. She denies SOB, DOE, or exertional CP,  but has increased fatigue and weakness, and pain when coughing to her chest. She has been afebrile and with her normal appetite, which is fairly poor.  Her weight is stable but below goal at 98.6 lbs.  She also has had tachycardia ranging 103-111, although she is 86 currently. Norvasc was discontinued on 08/27/14 and her BP has risen ranging 125-165/77-96.     Review of Systems:  Review of Systems  Constitutional: Positive for fatigue. Negative for fever, chills, diaphoresis, activity change, appetite change and unexpected weight change.  HENT: Positive for rhinorrhea. Negative for congestion, postnasal drip and trouble swallowing.        HOH  Eyes:       Poor vision  Respiratory: Negative for apnea, cough, shortness of breath and wheezing.        Chest pain with cough, not exertional. No DOE    Cardiovascular: Negative for chest pain, palpitations and leg swelling.  Genitourinary: Negative for dysuria, flank pain and difficulty urinating.  Musculoskeletal: Positive for back pain, arthralgias and gait problem. Negative for joint swelling.  Skin: Positive for rash. Negative for color change and wound.  Neurological: Negative for dizziness, seizures, speech difficulty and numbness.  Psychiatric/Behavioral: Negative for confusion and agitation.    Medications: Patient's Medications  New Prescriptions   No medications on file  Previous Medications   ACETAMINOPHEN (TYLENOL) 500 MG TABLET    Take 1,000 mg by mouth 4 (four) times daily -  with meals and at bedtime.    BETA CAROTENE W/MINERALS (OCUVITE) TABLET    Take 1 tablet by mouth 2 (two) times daily.   CALCIUM CARB-CHOLECALCIFEROL (CALCIUM 600 + D PO)    Take 1 tablet by mouth 2 (two) times daily.   CETIRIZINE (ZYRTEC) 10 MG TABLET    Take 10 mg by mouth every morning.    DILTIAZEM (CARDIZEM SR) 120 MG 12 HR CAPSULE    Take 120 mg by mouth 2 (two) times daily.   FEEDING SUPPLEMENT, ENSURE COMPLETE, (ENSURE COMPLETE) LIQD    Take 237 mLs by mouth 3 (three) times daily between meals.   FLUTICASONE (VERAMYST) 27.5 MCG/SPRAY NASAL SPRAY    Place 1 spray into the nose daily.   HYDROCORTISONE (ANUSOL-HC) 2.5 % RECTAL CREAM    Place 1 application rectally as needed for hemorrhoids or  itching.   LEVOFLOXACIN (LEVAQUIN) 500 MG TABLET    Take 1 tablet (500 mg total) by mouth daily.   MOEXIPRIL (UNIVASC) 15 MG TABLET    Take 15 mg by mouth every evening.   OMEPRAZOLE (PRILOSEC) 20 MG CAPSULE    Take 20 mg by mouth daily with lunch.    POLYETHYLENE GLYCOL (MIRALAX / GLYCOLAX) PACKET    Take 17 g by mouth every evening.    SACCHAROMYCES BOULARDII (FLORASTOR) 250 MG CAPSULE    Take 250 mg by mouth 2 (two) times daily.   TRAMADOL (ULTRAM) 50 MG TABLET    Take 1 tablet (50 mg total) by mouth every 6 (six) hours as needed for moderate pain.    VITAMIN B-12 (CYANOCOBALAMIN) 1000 MCG TABLET    Take 1,000 mcg by mouth daily.    ZOLPIDEM (AMBIEN) 5 MG TABLET    Take 1 tablet (5 mg total) by mouth at bedtime.  Modified Medications   No medications on file  Discontinued Medications   No medications on file     Physical Exam:  Filed Vitals:   09/08/14 1038  BP: 139/74  Pulse: 82  Temp: 98.2 F (36.8 C)  Resp: 16  Weight: 98 lb 9.6 oz (44.725 kg)  SpO2: 95%    Physical Exam  Constitutional: No distress.  Frail  HENT:  Head: Normocephalic and atraumatic.  Neck: No JVD present. No tracheal deviation present.  Cardiovascular: Normal rate, regular rhythm, normal heart sounds and intact distal pulses.   No murmur heard. Pulmonary/Chest: Effort normal and breath sounds normal. No respiratory distress. She has no wheezes. She has no rales.  Abdominal: Soft. Bowel sounds are normal. She exhibits no distension. There is no tenderness.  Musculoskeletal: She exhibits no edema or tenderness.  Strength 3/5 to BUE and BLE. Uses walker for ambulation. No pain on palpation to spine, no bruising or deformity  Lymphadenopathy:    She has no cervical adenopathy.  Neurological: She is alert. No cranial nerve deficit.  Oriented x2  Skin: Skin is warm and dry. Rash noted. She is not diaphoretic. No erythema.  Erythema noted to left buttock  Psychiatric: Affect normal.    Labs reviewed/Significant Diagnostic Results:  CXR PA and Lateral: 08/27/2014 :  1. Persistent infiltrate right lower lobe. 2. Persistent right pleural effusion.  CT of the chest 08/11/14 IMPRESSION: No significant change in the size of the multiloculated right pleural effusion. Locules of air are now present within the pleural space and may be related to recent thoracentesis. Bronchopleural fistula could have this appearance. However, given the recent thoracentesis, this is felt less likely. Atelectasis in both lower lobes, right greater than left. New  trace left pleural effusion. Cardiomegaly. Old granulomatous disease.  CT of the chest 07/1014 IMPRESSION: 1. Moderate loculated RIGHT pleural effusion, likely producing shortness of breath. This produces substantial atelectasis of the RIGHT lower lobe and partial atelectasis of the RIGHT middle lobe. This could represent a parapneumonic effusion or malignant effusion. Ordinary effusion associated with CHF is also possible. 2. 7 mm RIGHT middle lobe pulmonary nodule. Although follow-up is typically recommended, this is at the discretion of the referring clinician in this patient of advanced age. Guideline recommendations as follows: If the patient is at high risk for bronchogenic carcinoma, follow-up chest CT at 3-16months is recommended. If the patient is at low risk for bronchogenic carcinoma, follow-up chest CT at 6-12 months is recommended. This recommendation follows the consensus statement: Guidelines for Management of Small Pulmonary  Nodules Detected on CT Scans: A Statement from the [place] as published in Radiology 2005; 237:395-400. 3. Severe atherosclerosis and coronary artery disease with ectasia of the ascending aorta. Again, follow-up is at the discretion of the referring clinician.  Basic Metabolic Panel:  Recent Labs  08/10/14 0535 08/11/14 0445 08/27/14 1022  NA 137 138 133*  K 3.9 4.0 3.7  CL 103 102 99  CO2 23 24 24   GLUCOSE 96 89 117*  BUN 14 15 10   CREATININE 0.64 0.66 0.9  CALCIUM 9.2 9.3 9.4   Liver Function Tests:  Recent Labs  08/06/14 1817 08/07/14 0354 08/07/14 0958 08/27/14 1022  AST 14 14  --  20  ALT 12 11  --  11  ALKPHOS 163* 143*  --  125*  BILITOT 0.3 0.3  --  0.3  PROT 6.5 5.7* 5.5* 6.6  ALBUMIN 2.2* 1.9*  --  2.7*   No results for input(s): LIPASE, AMYLASE in the last 8760 hours. No results for input(s): AMMONIA in the last 8760 hours. CBC:  Recent Labs  08/06/14 1817 08/07/14 0354 08/11/14 0445  08/12/14 0433 08/27/14 1022  WBC 17.4* 14.0* 16.6* 16.8* 14.4*  NEUTROABS 14.4* 10.8*  --   --  11.1*  HGB 13.3 11.3* 12.6 12.6 12.6  HCT 38.8 33.3* 38.1 38.9 38.8  MCV 89.6 88.6 90.5 92.2 91.3  PLT 482* 532* 671* 642* 740.0*     Assessment/Plan  Essential hypertension No improvement in tachycardia (103-11) with discontinuation of Norvasc. BP elevated  Range 125-165/77-96.  Currently on Univasc 15 mg daily. Add Diltazem.  Debility Weaker over time. PT to eval and tx  GERD (gastroesophageal reflux disease) H/O GI bleed, remains on Prilosec. Continue and monitor.  Loculated pleural effusion on Right Afebrile. Continues on Levaquin and Florastor through 09/09/14. Denies pain except with coughing. F/U with pulmonary next week.  Sinus tachycardia Begin Diltiazem 120mg  ER BID, hold for HR <60  Dementia MMSE 21/30, not able to perform clock test due to eye sight on 08/13/14.  BIMS 15/15 on 07/10/12. Continue to monitor, no meds at this point.   B12 deficiency Currently on supplement. Check CBC.  Lumbar back pain Xray performed to thoracic and lumbar area after a fall with reported back pain. Ultram ordered prn for pain. Using heating pad. Await results.   BMP, CBC ordered  Cindi Carbon, [place] [phone]

## 2014-09-08 NOTE — Assessment & Plan Note (Signed)
Currently on supplement. Check CBC.

## 2014-09-08 NOTE — Assessment & Plan Note (Signed)
MMSE 21/30, not able to perform clock test due to eye sight on 08/13/14.  BIMS 15/15 on 07/10/12. Continue to monitor, no meds at this point.

## 2014-09-08 NOTE — Assessment & Plan Note (Signed)
H/O GI bleed, remains on Prilosec. Continue and monitor.

## 2014-09-08 NOTE — Assessment & Plan Note (Signed)
Afebrile. Continues on Levaquin and Florastor through 09/09/14. Denies pain except with coughing. F/U with pulmonary next week.

## 2014-09-08 NOTE — Assessment & Plan Note (Signed)
Begin Diltiazem 120mg  ER BID, hold for HR <60

## 2014-09-09 DIAGNOSIS — J918 Pleural effusion in other conditions classified elsewhere: Secondary | ICD-10-CM | POA: Diagnosis not present

## 2014-09-09 DIAGNOSIS — R270 Ataxia, unspecified: Secondary | ICD-10-CM | POA: Diagnosis not present

## 2014-09-09 DIAGNOSIS — J90 Pleural effusion, not elsewhere classified: Secondary | ICD-10-CM | POA: Diagnosis not present

## 2014-09-09 DIAGNOSIS — J189 Pneumonia, unspecified organism: Secondary | ICD-10-CM | POA: Diagnosis not present

## 2014-09-09 DIAGNOSIS — R262 Difficulty in walking, not elsewhere classified: Secondary | ICD-10-CM | POA: Diagnosis not present

## 2014-09-09 DIAGNOSIS — M6281 Muscle weakness (generalized): Secondary | ICD-10-CM | POA: Diagnosis not present

## 2014-09-09 DIAGNOSIS — R279 Unspecified lack of coordination: Secondary | ICD-10-CM | POA: Diagnosis not present

## 2014-09-10 DIAGNOSIS — R270 Ataxia, unspecified: Secondary | ICD-10-CM | POA: Diagnosis not present

## 2014-09-10 DIAGNOSIS — R262 Difficulty in walking, not elsewhere classified: Secondary | ICD-10-CM | POA: Diagnosis not present

## 2014-09-10 DIAGNOSIS — J189 Pneumonia, unspecified organism: Secondary | ICD-10-CM | POA: Diagnosis not present

## 2014-09-10 DIAGNOSIS — M6281 Muscle weakness (generalized): Secondary | ICD-10-CM | POA: Diagnosis not present

## 2014-09-10 DIAGNOSIS — J918 Pleural effusion in other conditions classified elsewhere: Secondary | ICD-10-CM | POA: Diagnosis not present

## 2014-09-10 DIAGNOSIS — R279 Unspecified lack of coordination: Secondary | ICD-10-CM | POA: Diagnosis not present

## 2014-09-11 DIAGNOSIS — J918 Pleural effusion in other conditions classified elsewhere: Secondary | ICD-10-CM | POA: Diagnosis not present

## 2014-09-11 DIAGNOSIS — R270 Ataxia, unspecified: Secondary | ICD-10-CM | POA: Diagnosis not present

## 2014-09-11 DIAGNOSIS — J189 Pneumonia, unspecified organism: Secondary | ICD-10-CM | POA: Diagnosis not present

## 2014-09-11 DIAGNOSIS — R279 Unspecified lack of coordination: Secondary | ICD-10-CM | POA: Diagnosis not present

## 2014-09-11 DIAGNOSIS — R262 Difficulty in walking, not elsewhere classified: Secondary | ICD-10-CM | POA: Diagnosis not present

## 2014-09-11 DIAGNOSIS — M6281 Muscle weakness (generalized): Secondary | ICD-10-CM | POA: Diagnosis not present

## 2014-09-13 DIAGNOSIS — R279 Unspecified lack of coordination: Secondary | ICD-10-CM | POA: Diagnosis not present

## 2014-09-13 DIAGNOSIS — R262 Difficulty in walking, not elsewhere classified: Secondary | ICD-10-CM | POA: Diagnosis not present

## 2014-09-13 DIAGNOSIS — M6281 Muscle weakness (generalized): Secondary | ICD-10-CM | POA: Diagnosis not present

## 2014-09-13 DIAGNOSIS — J189 Pneumonia, unspecified organism: Secondary | ICD-10-CM | POA: Diagnosis not present

## 2014-09-13 DIAGNOSIS — J918 Pleural effusion in other conditions classified elsewhere: Secondary | ICD-10-CM | POA: Diagnosis not present

## 2014-09-13 DIAGNOSIS — R270 Ataxia, unspecified: Secondary | ICD-10-CM | POA: Diagnosis not present

## 2014-09-14 DIAGNOSIS — R270 Ataxia, unspecified: Secondary | ICD-10-CM | POA: Diagnosis not present

## 2014-09-14 DIAGNOSIS — J918 Pleural effusion in other conditions classified elsewhere: Secondary | ICD-10-CM | POA: Diagnosis not present

## 2014-09-14 DIAGNOSIS — J189 Pneumonia, unspecified organism: Secondary | ICD-10-CM | POA: Diagnosis not present

## 2014-09-14 DIAGNOSIS — M6281 Muscle weakness (generalized): Secondary | ICD-10-CM | POA: Diagnosis not present

## 2014-09-14 DIAGNOSIS — R262 Difficulty in walking, not elsewhere classified: Secondary | ICD-10-CM | POA: Diagnosis not present

## 2014-09-14 DIAGNOSIS — R279 Unspecified lack of coordination: Secondary | ICD-10-CM | POA: Diagnosis not present

## 2014-09-15 DIAGNOSIS — R270 Ataxia, unspecified: Secondary | ICD-10-CM | POA: Diagnosis not present

## 2014-09-15 DIAGNOSIS — J189 Pneumonia, unspecified organism: Secondary | ICD-10-CM | POA: Diagnosis not present

## 2014-09-15 DIAGNOSIS — R262 Difficulty in walking, not elsewhere classified: Secondary | ICD-10-CM | POA: Diagnosis not present

## 2014-09-15 DIAGNOSIS — J918 Pleural effusion in other conditions classified elsewhere: Secondary | ICD-10-CM | POA: Diagnosis not present

## 2014-09-15 DIAGNOSIS — R279 Unspecified lack of coordination: Secondary | ICD-10-CM | POA: Diagnosis not present

## 2014-09-15 DIAGNOSIS — M6281 Muscle weakness (generalized): Secondary | ICD-10-CM | POA: Diagnosis not present

## 2014-09-16 DIAGNOSIS — J189 Pneumonia, unspecified organism: Secondary | ICD-10-CM | POA: Diagnosis not present

## 2014-09-16 DIAGNOSIS — R270 Ataxia, unspecified: Secondary | ICD-10-CM | POA: Diagnosis not present

## 2014-09-16 DIAGNOSIS — M6281 Muscle weakness (generalized): Secondary | ICD-10-CM | POA: Diagnosis not present

## 2014-09-16 DIAGNOSIS — R279 Unspecified lack of coordination: Secondary | ICD-10-CM | POA: Diagnosis not present

## 2014-09-16 DIAGNOSIS — J918 Pleural effusion in other conditions classified elsewhere: Secondary | ICD-10-CM | POA: Diagnosis not present

## 2014-09-16 DIAGNOSIS — R262 Difficulty in walking, not elsewhere classified: Secondary | ICD-10-CM | POA: Diagnosis not present

## 2014-09-17 DIAGNOSIS — J918 Pleural effusion in other conditions classified elsewhere: Secondary | ICD-10-CM | POA: Diagnosis not present

## 2014-09-17 DIAGNOSIS — J189 Pneumonia, unspecified organism: Secondary | ICD-10-CM | POA: Diagnosis not present

## 2014-09-17 DIAGNOSIS — R262 Difficulty in walking, not elsewhere classified: Secondary | ICD-10-CM | POA: Diagnosis not present

## 2014-09-17 DIAGNOSIS — R279 Unspecified lack of coordination: Secondary | ICD-10-CM | POA: Diagnosis not present

## 2014-09-17 DIAGNOSIS — M6281 Muscle weakness (generalized): Secondary | ICD-10-CM | POA: Diagnosis not present

## 2014-09-17 DIAGNOSIS — R270 Ataxia, unspecified: Secondary | ICD-10-CM | POA: Diagnosis not present

## 2014-09-21 DIAGNOSIS — J918 Pleural effusion in other conditions classified elsewhere: Secondary | ICD-10-CM | POA: Diagnosis not present

## 2014-09-21 DIAGNOSIS — M6281 Muscle weakness (generalized): Secondary | ICD-10-CM | POA: Diagnosis not present

## 2014-09-21 DIAGNOSIS — R270 Ataxia, unspecified: Secondary | ICD-10-CM | POA: Diagnosis not present

## 2014-09-21 DIAGNOSIS — R279 Unspecified lack of coordination: Secondary | ICD-10-CM | POA: Diagnosis not present

## 2014-09-21 DIAGNOSIS — R262 Difficulty in walking, not elsewhere classified: Secondary | ICD-10-CM | POA: Diagnosis not present

## 2014-09-21 DIAGNOSIS — J189 Pneumonia, unspecified organism: Secondary | ICD-10-CM | POA: Diagnosis not present

## 2014-09-22 DIAGNOSIS — J189 Pneumonia, unspecified organism: Secondary | ICD-10-CM | POA: Diagnosis not present

## 2014-09-22 DIAGNOSIS — R262 Difficulty in walking, not elsewhere classified: Secondary | ICD-10-CM | POA: Diagnosis not present

## 2014-09-22 DIAGNOSIS — R270 Ataxia, unspecified: Secondary | ICD-10-CM | POA: Diagnosis not present

## 2014-09-22 DIAGNOSIS — R279 Unspecified lack of coordination: Secondary | ICD-10-CM | POA: Diagnosis not present

## 2014-09-22 DIAGNOSIS — M6281 Muscle weakness (generalized): Secondary | ICD-10-CM | POA: Diagnosis not present

## 2014-09-22 DIAGNOSIS — J918 Pleural effusion in other conditions classified elsewhere: Secondary | ICD-10-CM | POA: Diagnosis not present

## 2014-09-22 LAB — AFB CULTURE WITH SMEAR (NOT AT ARMC): Acid Fast Smear: NONE SEEN

## 2014-09-23 DIAGNOSIS — R262 Difficulty in walking, not elsewhere classified: Secondary | ICD-10-CM | POA: Diagnosis not present

## 2014-09-23 DIAGNOSIS — J189 Pneumonia, unspecified organism: Secondary | ICD-10-CM | POA: Diagnosis not present

## 2014-09-23 DIAGNOSIS — R270 Ataxia, unspecified: Secondary | ICD-10-CM | POA: Diagnosis not present

## 2014-09-23 DIAGNOSIS — M6281 Muscle weakness (generalized): Secondary | ICD-10-CM | POA: Diagnosis not present

## 2014-09-23 DIAGNOSIS — R279 Unspecified lack of coordination: Secondary | ICD-10-CM | POA: Diagnosis not present

## 2014-09-23 DIAGNOSIS — J918 Pleural effusion in other conditions classified elsewhere: Secondary | ICD-10-CM | POA: Diagnosis not present

## 2014-09-24 DIAGNOSIS — R279 Unspecified lack of coordination: Secondary | ICD-10-CM | POA: Diagnosis not present

## 2014-09-24 DIAGNOSIS — J189 Pneumonia, unspecified organism: Secondary | ICD-10-CM | POA: Diagnosis not present

## 2014-09-24 DIAGNOSIS — M6281 Muscle weakness (generalized): Secondary | ICD-10-CM | POA: Diagnosis not present

## 2014-09-24 DIAGNOSIS — R270 Ataxia, unspecified: Secondary | ICD-10-CM | POA: Diagnosis not present

## 2014-09-24 DIAGNOSIS — J918 Pleural effusion in other conditions classified elsewhere: Secondary | ICD-10-CM | POA: Diagnosis not present

## 2014-09-24 DIAGNOSIS — R262 Difficulty in walking, not elsewhere classified: Secondary | ICD-10-CM | POA: Diagnosis not present

## 2014-09-25 DIAGNOSIS — J918 Pleural effusion in other conditions classified elsewhere: Secondary | ICD-10-CM | POA: Diagnosis not present

## 2014-09-25 DIAGNOSIS — M6281 Muscle weakness (generalized): Secondary | ICD-10-CM | POA: Diagnosis not present

## 2014-09-25 DIAGNOSIS — R279 Unspecified lack of coordination: Secondary | ICD-10-CM | POA: Diagnosis not present

## 2014-09-25 DIAGNOSIS — J189 Pneumonia, unspecified organism: Secondary | ICD-10-CM | POA: Diagnosis not present

## 2014-09-25 DIAGNOSIS — R262 Difficulty in walking, not elsewhere classified: Secondary | ICD-10-CM | POA: Diagnosis not present

## 2014-09-25 DIAGNOSIS — R270 Ataxia, unspecified: Secondary | ICD-10-CM | POA: Diagnosis not present

## 2014-09-26 DIAGNOSIS — J189 Pneumonia, unspecified organism: Secondary | ICD-10-CM | POA: Diagnosis not present

## 2014-09-26 DIAGNOSIS — R262 Difficulty in walking, not elsewhere classified: Secondary | ICD-10-CM | POA: Diagnosis not present

## 2014-09-26 DIAGNOSIS — R270 Ataxia, unspecified: Secondary | ICD-10-CM | POA: Diagnosis not present

## 2014-09-26 DIAGNOSIS — R279 Unspecified lack of coordination: Secondary | ICD-10-CM | POA: Diagnosis not present

## 2014-09-26 DIAGNOSIS — J918 Pleural effusion in other conditions classified elsewhere: Secondary | ICD-10-CM | POA: Diagnosis not present

## 2014-09-26 DIAGNOSIS — M6281 Muscle weakness (generalized): Secondary | ICD-10-CM | POA: Diagnosis not present

## 2014-09-28 DIAGNOSIS — M6281 Muscle weakness (generalized): Secondary | ICD-10-CM | POA: Diagnosis not present

## 2014-09-28 DIAGNOSIS — J189 Pneumonia, unspecified organism: Secondary | ICD-10-CM | POA: Diagnosis not present

## 2014-09-28 DIAGNOSIS — J918 Pleural effusion in other conditions classified elsewhere: Secondary | ICD-10-CM | POA: Diagnosis not present

## 2014-09-28 DIAGNOSIS — R262 Difficulty in walking, not elsewhere classified: Secondary | ICD-10-CM | POA: Diagnosis not present

## 2014-09-28 DIAGNOSIS — R071 Chest pain on breathing: Secondary | ICD-10-CM | POA: Diagnosis not present

## 2014-09-28 DIAGNOSIS — R0600 Dyspnea, unspecified: Secondary | ICD-10-CM | POA: Diagnosis not present

## 2014-09-28 DIAGNOSIS — R279 Unspecified lack of coordination: Secondary | ICD-10-CM | POA: Diagnosis not present

## 2014-09-28 DIAGNOSIS — R270 Ataxia, unspecified: Secondary | ICD-10-CM | POA: Diagnosis not present

## 2014-09-28 DIAGNOSIS — R0602 Shortness of breath: Secondary | ICD-10-CM | POA: Diagnosis not present

## 2014-09-29 DIAGNOSIS — M6281 Muscle weakness (generalized): Secondary | ICD-10-CM | POA: Diagnosis not present

## 2014-09-29 DIAGNOSIS — R279 Unspecified lack of coordination: Secondary | ICD-10-CM | POA: Diagnosis not present

## 2014-09-29 DIAGNOSIS — R270 Ataxia, unspecified: Secondary | ICD-10-CM | POA: Diagnosis not present

## 2014-09-29 DIAGNOSIS — J918 Pleural effusion in other conditions classified elsewhere: Secondary | ICD-10-CM | POA: Diagnosis not present

## 2014-09-29 DIAGNOSIS — J189 Pneumonia, unspecified organism: Secondary | ICD-10-CM | POA: Diagnosis not present

## 2014-09-29 DIAGNOSIS — R262 Difficulty in walking, not elsewhere classified: Secondary | ICD-10-CM | POA: Diagnosis not present

## 2014-09-30 DIAGNOSIS — J189 Pneumonia, unspecified organism: Secondary | ICD-10-CM | POA: Diagnosis not present

## 2014-09-30 DIAGNOSIS — R262 Difficulty in walking, not elsewhere classified: Secondary | ICD-10-CM | POA: Diagnosis not present

## 2014-09-30 DIAGNOSIS — R270 Ataxia, unspecified: Secondary | ICD-10-CM | POA: Diagnosis not present

## 2014-09-30 DIAGNOSIS — R279 Unspecified lack of coordination: Secondary | ICD-10-CM | POA: Diagnosis not present

## 2014-09-30 DIAGNOSIS — M6281 Muscle weakness (generalized): Secondary | ICD-10-CM | POA: Diagnosis not present

## 2014-09-30 DIAGNOSIS — J918 Pleural effusion in other conditions classified elsewhere: Secondary | ICD-10-CM | POA: Diagnosis not present

## 2014-10-02 DIAGNOSIS — J189 Pneumonia, unspecified organism: Secondary | ICD-10-CM | POA: Diagnosis not present

## 2014-10-02 DIAGNOSIS — R270 Ataxia, unspecified: Secondary | ICD-10-CM | POA: Diagnosis not present

## 2014-10-02 DIAGNOSIS — R262 Difficulty in walking, not elsewhere classified: Secondary | ICD-10-CM | POA: Diagnosis not present

## 2014-10-02 DIAGNOSIS — M6281 Muscle weakness (generalized): Secondary | ICD-10-CM | POA: Diagnosis not present

## 2014-10-02 DIAGNOSIS — J918 Pleural effusion in other conditions classified elsewhere: Secondary | ICD-10-CM | POA: Diagnosis not present

## 2014-10-02 DIAGNOSIS — R279 Unspecified lack of coordination: Secondary | ICD-10-CM | POA: Diagnosis not present

## 2014-10-03 DIAGNOSIS — M6281 Muscle weakness (generalized): Secondary | ICD-10-CM | POA: Diagnosis not present

## 2014-10-03 DIAGNOSIS — R262 Difficulty in walking, not elsewhere classified: Secondary | ICD-10-CM | POA: Diagnosis not present

## 2014-10-03 DIAGNOSIS — J189 Pneumonia, unspecified organism: Secondary | ICD-10-CM | POA: Diagnosis not present

## 2014-10-03 DIAGNOSIS — J918 Pleural effusion in other conditions classified elsewhere: Secondary | ICD-10-CM | POA: Diagnosis not present

## 2014-10-03 DIAGNOSIS — R279 Unspecified lack of coordination: Secondary | ICD-10-CM | POA: Diagnosis not present

## 2014-10-03 DIAGNOSIS — R270 Ataxia, unspecified: Secondary | ICD-10-CM | POA: Diagnosis not present

## 2014-10-05 DIAGNOSIS — M6281 Muscle weakness (generalized): Secondary | ICD-10-CM | POA: Diagnosis not present

## 2014-10-05 DIAGNOSIS — R262 Difficulty in walking, not elsewhere classified: Secondary | ICD-10-CM | POA: Diagnosis not present

## 2014-10-05 DIAGNOSIS — J189 Pneumonia, unspecified organism: Secondary | ICD-10-CM | POA: Diagnosis not present

## 2014-10-05 DIAGNOSIS — R270 Ataxia, unspecified: Secondary | ICD-10-CM | POA: Diagnosis not present

## 2014-10-05 DIAGNOSIS — R279 Unspecified lack of coordination: Secondary | ICD-10-CM | POA: Diagnosis not present

## 2014-10-05 DIAGNOSIS — J918 Pleural effusion in other conditions classified elsewhere: Secondary | ICD-10-CM | POA: Diagnosis not present

## 2014-10-06 ENCOUNTER — Non-Acute Institutional Stay (SKILLED_NURSING_FACILITY): Payer: Medicare Other | Admitting: Adult Health

## 2014-10-06 DIAGNOSIS — E43 Unspecified severe protein-calorie malnutrition: Secondary | ICD-10-CM | POA: Diagnosis not present

## 2014-10-06 DIAGNOSIS — R279 Unspecified lack of coordination: Secondary | ICD-10-CM | POA: Diagnosis not present

## 2014-10-06 DIAGNOSIS — K2100 Gastro-esophageal reflux disease with esophagitis, without bleeding: Secondary | ICD-10-CM

## 2014-10-06 DIAGNOSIS — J90 Pleural effusion, not elsewhere classified: Secondary | ICD-10-CM

## 2014-10-06 DIAGNOSIS — R140 Abdominal distension (gaseous): Secondary | ICD-10-CM

## 2014-10-06 DIAGNOSIS — R270 Ataxia, unspecified: Secondary | ICD-10-CM | POA: Diagnosis not present

## 2014-10-06 DIAGNOSIS — I10 Essential (primary) hypertension: Secondary | ICD-10-CM | POA: Diagnosis not present

## 2014-10-06 DIAGNOSIS — R911 Solitary pulmonary nodule: Secondary | ICD-10-CM | POA: Diagnosis not present

## 2014-10-06 DIAGNOSIS — R0600 Dyspnea, unspecified: Secondary | ICD-10-CM | POA: Diagnosis not present

## 2014-10-06 DIAGNOSIS — R5381 Other malaise: Secondary | ICD-10-CM | POA: Diagnosis not present

## 2014-10-06 DIAGNOSIS — J189 Pneumonia, unspecified organism: Secondary | ICD-10-CM | POA: Diagnosis not present

## 2014-10-06 DIAGNOSIS — H3530 Unspecified macular degeneration: Secondary | ICD-10-CM

## 2014-10-06 DIAGNOSIS — M6281 Muscle weakness (generalized): Secondary | ICD-10-CM | POA: Diagnosis not present

## 2014-10-06 DIAGNOSIS — M19011 Primary osteoarthritis, right shoulder: Secondary | ICD-10-CM | POA: Diagnosis not present

## 2014-10-06 DIAGNOSIS — R262 Difficulty in walking, not elsewhere classified: Secondary | ICD-10-CM | POA: Diagnosis not present

## 2014-10-06 DIAGNOSIS — J918 Pleural effusion in other conditions classified elsewhere: Secondary | ICD-10-CM | POA: Diagnosis not present

## 2014-10-06 NOTE — Progress Notes (Signed)
Patient ID: [name], female   DOB: [birth year], 81 y.o.   MRN: [id number]     Nursing Home Location:  [place]: SNF [phone])  Chief Complaint  Patient presents with  . Medical Management of Chronic Issues    HPI:  81 y.o. female residing at [facility], skilled care section. I am her to review her chronic diseases. She has a hx of memory loss, debility, right pleural effusion, htn, protein calorie malnutrition, htn, OA, and GERD. Today she reported bloating and gas. She is using Ensure TID and wonders if this is the cause for her symptoms. She has been eating less over time and her weight has decreased by 3 lbs to 95 lb in the past month. The staff and caregiver deny depression but state that she eats very little other than ensure.   She was admitted to the hospital 08/06/14-08/12/14 due to a right loculated pleural effusion and HCAP. Thoracenteses was done on 12/11 and 12/14 with drainage of 100 mL and 50 mL pleural fluid. Appeared exudative with high LDH and low glucose. but cultures negative. CT also shows 7 mm right middle lobe pulmonary nodule. Pleural fluid cytology negative for malignant cells. She has completed a 4 week course of Levaquin on 09/09/14. She denies SOB, DOE, or exertional CP.  She has been afebrile as well.   During her last visit her HR and BP were elevated and Diltiazem was ordered but never started due to subsequent low BP readings. This issue has resolved.  She is also requesting to discontinue the Ocuvite vitamin that she takes because she can not swallow it and does not like to have it crushed.      Review of Systems:  Review of Systems  Constitutional: Negative for fever, chills, diaphoresis, activity change, appetite change, fatigue and unexpected weight change.  HENT: Negative for congestion, postnasal drip, rhinorrhea and trouble swallowing.        HOH  Eyes:       Poor vision  Respiratory:  Negative for apnea, cough, shortness of breath and wheezing.   Cardiovascular: Negative for chest pain, palpitations and leg swelling.  Gastrointestinal: Positive for abdominal pain. Negative for nausea, vomiting, diarrhea, constipation, abdominal distention and rectal pain.       Bloating and excessive gas  Genitourinary: Negative for dysuria, flank pain and difficulty urinating.  Musculoskeletal: Positive for back pain, arthralgias and gait problem. Negative for joint swelling.       Chronic right shoulder pain  Skin: Negative for color change, rash and wound.  Neurological: Negative for dizziness, seizures, speech difficulty and numbness.  Psychiatric/Behavioral: Negative for confusion and agitation.    Medications: Patient's Medications  New Prescriptions   No medications on file  Previous Medications   ACETAMINOPHEN (TYLENOL) 500 MG TABLET    Take 1,000 mg by mouth 4 (four) times daily -  with meals and at bedtime.    CALCIUM CARB-CHOLECALCIFEROL (CALCIUM 600 + D PO)    Take 1 tablet by mouth 2 (two) times daily.   CETIRIZINE (ZYRTEC) 10 MG TABLET    Take 10 mg by mouth every morning.    FEEDING SUPPLEMENT, ENSURE COMPLETE, (ENSURE COMPLETE) LIQD    Take 237 mLs by mouth 3 (three) times daily between meals.   FLUTICASONE (VERAMYST) 27.5 MCG/SPRAY NASAL SPRAY    Place 1 spray into the nose daily.   HYDROCORTISONE (ANUSOL-HC) 2.5 % RECTAL CREAM    Place 1 application  rectally as needed for hemorrhoids or itching.   MOEXIPRIL (UNIVASC) 15 MG TABLET    Take 15 mg by mouth every evening.   OMEPRAZOLE (PRILOSEC) 20 MG CAPSULE    Take 20 mg by mouth daily with lunch.    POLYETHYLENE GLYCOL (MIRALAX / GLYCOLAX) PACKET    Take 17 g by mouth every evening.    TRAMADOL (ULTRAM) 50 MG TABLET    Take 1 tablet (50 mg total) by mouth every 6 (six) hours as needed for moderate pain.   VITAMIN B-12 (CYANOCOBALAMIN) 1000 MCG TABLET    Take 1,000 mcg by mouth daily.    ZOLPIDEM (AMBIEN) 5 MG TABLET     Take 1 tablet (5 mg total) by mouth at bedtime.  Modified Medications   No medications on file  Discontinued Medications   BETA CAROTENE W/MINERALS (OCUVITE) TABLET    Take 1 tablet by mouth 2 (two) times daily.   DILTIAZEM (CARDIZEM SR) 120 MG 12 HR CAPSULE    Take 120 mg by mouth 2 (two) times daily.   SACCHAROMYCES BOULARDII (FLORASTOR) 250 MG CAPSULE    Take 250 mg by mouth 2 (two) times daily.     Physical Exam:  Filed Vitals:   10/06/14 1210  BP: 136/81  Pulse: 94  Temp: 97.3 F (36.3 C)  Resp: 18  Weight: 95 lb (43.092 kg)  SpO2: 97%    Physical Exam  Constitutional: No distress.  Frail  HENT:  Head: Normocephalic and atraumatic.  Neck: No JVD present. No tracheal deviation present.  Cardiovascular: Normal rate, regular rhythm, normal heart sounds and intact distal pulses.   No murmur heard. Pulmonary/Chest: Effort normal and breath sounds normal. No respiratory distress. She has no wheezes. She has no rales.  Abdominal: Soft. Bowel sounds are normal. She exhibits no distension. There is no tenderness.  Musculoskeletal: She exhibits no edema or tenderness.  Strength 3/5 to BUE and BLE. Uses walker for ambulation. No pain on palpation to spine, no bruising or deformity  Lymphadenopathy:    She has no cervical adenopathy.  Neurological: She is alert. No cranial nerve deficit.  Oriented x2  Skin: Skin is warm and dry. No rash noted. She is not diaphoretic. No erythema.  Psychiatric: Affect normal.    Labs reviewed/Significant Diagnostic Results:  CXR PA and Lateral: 08/27/2014 :  1. Persistent infiltrate right lower lobe. 2. Persistent right pleural effusion.  CT of the chest 08/11/14 IMPRESSION: No significant change in the size of the multiloculated right pleural effusion. Locules of air are now present within the pleural space and may be related to recent thoracentesis. Bronchopleural fistula could have this appearance. However, given the  recent thoracentesis, this is felt less likely. Atelectasis in both lower lobes, right greater than left. New trace left pleural effusion. Cardiomegaly. Old granulomatous disease.  CT of the chest 07/1014 IMPRESSION: 1. Moderate loculated RIGHT pleural effusion, likely producing shortness of breath. This produces substantial atelectasis of the RIGHT lower lobe and partial atelectasis of the RIGHT middle lobe. This could represent a parapneumonic effusion or malignant effusion. Ordinary effusion associated with CHF is also possible. 2. 7 mm RIGHT middle lobe pulmonary nodule. Although follow-up is typically recommended, this is at the discretion of the referring clinician in this patient of advanced age. Guideline recommendations as follows: If the patient is at high risk for bronchogenic carcinoma, follow-up chest CT at 3-61months is recommended. If the patient is at low risk for bronchogenic carcinoma, follow-up chest CT at 6-12  months is recommended. This recommendation follows the consensus statement: Guidelines for Management of Small Pulmonary Nodules Detected on CT Scans: A Statement from the [place] as published in Radiology 2005; 237:395-400. 3. Severe atherosclerosis and coronary artery disease with ectasia of the ascending aorta. Again, follow-up is at the discretion of the referring clinician.  Basic Metabolic Panel:  Recent Labs  08/10/14 0535 08/11/14 0445 08/27/14 1022 09/08/14  NA 137 138 133* 136*  K 3.9 4.0 3.7 4.3  CL 103 102 99  --   CO2 23 24 24   --   GLUCOSE 96 89 117*  --   BUN 14 15 10 12   CREATININE 0.64 0.66 0.9 0.6  CALCIUM 9.2 9.3 9.4  --    Liver Function Tests:  Recent Labs  08/06/14 1817 08/07/14 0354 08/07/14 0958 08/27/14 1022  AST 14 14  --  20  ALT 12 11  --  11  ALKPHOS 163* 143*  --  125*  BILITOT 0.3 0.3  --  0.3  PROT 6.5 5.7* 5.5* 6.6  ALBUMIN 2.2* 1.9*  --  2.7*   No results for input(s): LIPASE, AMYLASE  in the last 8760 hours. No results for input(s): AMMONIA in the last 8760 hours. CBC:  Recent Labs  08/06/14 1817 08/07/14 0354 08/11/14 0445 08/12/14 0433 08/27/14 1022  WBC 17.4* 14.0* 16.6* 16.8* 14.4*  NEUTROABS 14.4* 10.8*  --   --  11.1*  HGB 13.3 11.3* 12.6 12.6 12.6  HCT 38.8 33.3* 38.1 38.9 38.8  MCV 89.6 88.6 90.5 92.2 91.3  PLT 482* 532* 671* 642* 740.0*     Assessment/Plan  1. Loculated pleural effusion on Right No SOB or pain. Completed Levaquin with no new episodes of fever or tachycardia. Continue to monitor and return to pulmonary if symptoms noted. Check CBC.  2. Essential hypertension Controlled with Univasc  3. Gastroesophageal reflux disease with esophagitis Stable on Prilosec  4. Dyspnea Resolved.   5. Protein-calorie malnutrition, severe Continue to monitor weights and give nutritional supplements. See below.   6. Primary osteoarthritis of right shoulder She continues to use ultram at night periodically for pain. She has a hx of OA with rotator cuff tear to the right shoulder and is not able to have surgery due to her age. Continue scheduled Tylenol and prn ultram.   7. Debility Continue walking program and supportive care  8. Lung nodule No further workup due to age and debilty  28. Bloating Try other supplements to see if this reduce gas. Simethicone 1 tab BID  10. Macular degeneration D/C Ocuvite due to inability to swallow    Cindi Carbon, [place] [phone]

## 2014-10-08 DIAGNOSIS — J189 Pneumonia, unspecified organism: Secondary | ICD-10-CM | POA: Diagnosis not present

## 2014-10-08 DIAGNOSIS — J918 Pleural effusion in other conditions classified elsewhere: Secondary | ICD-10-CM | POA: Diagnosis not present

## 2014-10-08 DIAGNOSIS — R279 Unspecified lack of coordination: Secondary | ICD-10-CM | POA: Diagnosis not present

## 2014-10-08 DIAGNOSIS — R270 Ataxia, unspecified: Secondary | ICD-10-CM | POA: Diagnosis not present

## 2014-10-08 DIAGNOSIS — R262 Difficulty in walking, not elsewhere classified: Secondary | ICD-10-CM | POA: Diagnosis not present

## 2014-10-08 DIAGNOSIS — J90 Pleural effusion, not elsewhere classified: Secondary | ICD-10-CM | POA: Diagnosis not present

## 2014-10-08 DIAGNOSIS — M6281 Muscle weakness (generalized): Secondary | ICD-10-CM | POA: Diagnosis not present

## 2014-10-08 LAB — CBC AND DIFFERENTIAL
HEMATOCRIT: 39 % (ref 36–46)
Hemoglobin: 12.3 g/dL (ref 12.0–16.0)
Platelets: 443 10*3/uL — AB (ref 150–399)
WBC: 9 10^3/mL

## 2014-10-09 DIAGNOSIS — R262 Difficulty in walking, not elsewhere classified: Secondary | ICD-10-CM | POA: Diagnosis not present

## 2014-10-09 DIAGNOSIS — J189 Pneumonia, unspecified organism: Secondary | ICD-10-CM | POA: Diagnosis not present

## 2014-10-09 DIAGNOSIS — J918 Pleural effusion in other conditions classified elsewhere: Secondary | ICD-10-CM | POA: Diagnosis not present

## 2014-10-09 DIAGNOSIS — R270 Ataxia, unspecified: Secondary | ICD-10-CM | POA: Diagnosis not present

## 2014-10-09 DIAGNOSIS — M6281 Muscle weakness (generalized): Secondary | ICD-10-CM | POA: Diagnosis not present

## 2014-10-09 DIAGNOSIS — R279 Unspecified lack of coordination: Secondary | ICD-10-CM | POA: Diagnosis not present

## 2014-10-10 DIAGNOSIS — M6281 Muscle weakness (generalized): Secondary | ICD-10-CM | POA: Diagnosis not present

## 2014-10-10 DIAGNOSIS — J918 Pleural effusion in other conditions classified elsewhere: Secondary | ICD-10-CM | POA: Diagnosis not present

## 2014-10-10 DIAGNOSIS — R279 Unspecified lack of coordination: Secondary | ICD-10-CM | POA: Diagnosis not present

## 2014-10-10 DIAGNOSIS — J189 Pneumonia, unspecified organism: Secondary | ICD-10-CM | POA: Diagnosis not present

## 2014-10-10 DIAGNOSIS — R262 Difficulty in walking, not elsewhere classified: Secondary | ICD-10-CM | POA: Diagnosis not present

## 2014-10-10 DIAGNOSIS — R270 Ataxia, unspecified: Secondary | ICD-10-CM | POA: Diagnosis not present

## 2014-10-13 DIAGNOSIS — J189 Pneumonia, unspecified organism: Secondary | ICD-10-CM | POA: Diagnosis not present

## 2014-10-13 DIAGNOSIS — R279 Unspecified lack of coordination: Secondary | ICD-10-CM | POA: Diagnosis not present

## 2014-10-13 DIAGNOSIS — M6281 Muscle weakness (generalized): Secondary | ICD-10-CM | POA: Diagnosis not present

## 2014-10-13 DIAGNOSIS — R270 Ataxia, unspecified: Secondary | ICD-10-CM | POA: Diagnosis not present

## 2014-10-13 DIAGNOSIS — R262 Difficulty in walking, not elsewhere classified: Secondary | ICD-10-CM | POA: Diagnosis not present

## 2014-10-13 DIAGNOSIS — J918 Pleural effusion in other conditions classified elsewhere: Secondary | ICD-10-CM | POA: Diagnosis not present

## 2014-10-14 DIAGNOSIS — M6281 Muscle weakness (generalized): Secondary | ICD-10-CM | POA: Diagnosis not present

## 2014-10-14 DIAGNOSIS — R262 Difficulty in walking, not elsewhere classified: Secondary | ICD-10-CM | POA: Diagnosis not present

## 2014-10-14 DIAGNOSIS — J189 Pneumonia, unspecified organism: Secondary | ICD-10-CM | POA: Diagnosis not present

## 2014-10-14 DIAGNOSIS — R270 Ataxia, unspecified: Secondary | ICD-10-CM | POA: Diagnosis not present

## 2014-10-14 DIAGNOSIS — J918 Pleural effusion in other conditions classified elsewhere: Secondary | ICD-10-CM | POA: Diagnosis not present

## 2014-10-14 DIAGNOSIS — R279 Unspecified lack of coordination: Secondary | ICD-10-CM | POA: Diagnosis not present

## 2014-10-15 DIAGNOSIS — R262 Difficulty in walking, not elsewhere classified: Secondary | ICD-10-CM | POA: Diagnosis not present

## 2014-10-15 DIAGNOSIS — R279 Unspecified lack of coordination: Secondary | ICD-10-CM | POA: Diagnosis not present

## 2014-10-15 DIAGNOSIS — J918 Pleural effusion in other conditions classified elsewhere: Secondary | ICD-10-CM | POA: Diagnosis not present

## 2014-10-15 DIAGNOSIS — M6281 Muscle weakness (generalized): Secondary | ICD-10-CM | POA: Diagnosis not present

## 2014-10-15 DIAGNOSIS — R270 Ataxia, unspecified: Secondary | ICD-10-CM | POA: Diagnosis not present

## 2014-10-15 DIAGNOSIS — J189 Pneumonia, unspecified organism: Secondary | ICD-10-CM | POA: Diagnosis not present

## 2014-10-16 DIAGNOSIS — J918 Pleural effusion in other conditions classified elsewhere: Secondary | ICD-10-CM | POA: Diagnosis not present

## 2014-10-16 DIAGNOSIS — R262 Difficulty in walking, not elsewhere classified: Secondary | ICD-10-CM | POA: Diagnosis not present

## 2014-10-16 DIAGNOSIS — J189 Pneumonia, unspecified organism: Secondary | ICD-10-CM | POA: Diagnosis not present

## 2014-10-16 DIAGNOSIS — R270 Ataxia, unspecified: Secondary | ICD-10-CM | POA: Diagnosis not present

## 2014-10-16 DIAGNOSIS — M6281 Muscle weakness (generalized): Secondary | ICD-10-CM | POA: Diagnosis not present

## 2014-10-16 DIAGNOSIS — R279 Unspecified lack of coordination: Secondary | ICD-10-CM | POA: Diagnosis not present

## 2014-10-21 ENCOUNTER — Other Ambulatory Visit: Payer: Self-pay | Admitting: *Deleted

## 2014-10-21 DIAGNOSIS — R279 Unspecified lack of coordination: Secondary | ICD-10-CM | POA: Diagnosis not present

## 2014-10-21 DIAGNOSIS — J189 Pneumonia, unspecified organism: Secondary | ICD-10-CM | POA: Diagnosis not present

## 2014-10-21 DIAGNOSIS — R270 Ataxia, unspecified: Secondary | ICD-10-CM | POA: Diagnosis not present

## 2014-10-21 DIAGNOSIS — J918 Pleural effusion in other conditions classified elsewhere: Secondary | ICD-10-CM | POA: Diagnosis not present

## 2014-10-21 DIAGNOSIS — M6281 Muscle weakness (generalized): Secondary | ICD-10-CM | POA: Diagnosis not present

## 2014-10-21 DIAGNOSIS — R262 Difficulty in walking, not elsewhere classified: Secondary | ICD-10-CM | POA: Diagnosis not present

## 2014-10-21 MED ORDER — ZOLPIDEM TARTRATE 5 MG PO TABS: 5.0000 mg | ORAL_TABLET | Freq: Every day | ORAL | Status: DC

## 2014-10-21 NOTE — Telephone Encounter (Signed)
Refilled patient's Ambien (5mg ) for [facility] facility.

## 2014-10-22 DIAGNOSIS — R262 Difficulty in walking, not elsewhere classified: Secondary | ICD-10-CM | POA: Diagnosis not present

## 2014-10-22 DIAGNOSIS — J189 Pneumonia, unspecified organism: Secondary | ICD-10-CM | POA: Diagnosis not present

## 2014-10-22 DIAGNOSIS — R279 Unspecified lack of coordination: Secondary | ICD-10-CM | POA: Diagnosis not present

## 2014-10-22 DIAGNOSIS — M6281 Muscle weakness (generalized): Secondary | ICD-10-CM | POA: Diagnosis not present

## 2014-10-22 DIAGNOSIS — R270 Ataxia, unspecified: Secondary | ICD-10-CM | POA: Diagnosis not present

## 2014-10-22 DIAGNOSIS — J918 Pleural effusion in other conditions classified elsewhere: Secondary | ICD-10-CM | POA: Diagnosis not present

## 2014-10-23 DIAGNOSIS — R262 Difficulty in walking, not elsewhere classified: Secondary | ICD-10-CM | POA: Diagnosis not present

## 2014-10-23 DIAGNOSIS — R279 Unspecified lack of coordination: Secondary | ICD-10-CM | POA: Diagnosis not present

## 2014-10-23 DIAGNOSIS — J189 Pneumonia, unspecified organism: Secondary | ICD-10-CM | POA: Diagnosis not present

## 2014-10-23 DIAGNOSIS — M6281 Muscle weakness (generalized): Secondary | ICD-10-CM | POA: Diagnosis not present

## 2014-10-23 DIAGNOSIS — R270 Ataxia, unspecified: Secondary | ICD-10-CM | POA: Diagnosis not present

## 2014-10-23 DIAGNOSIS — J918 Pleural effusion in other conditions classified elsewhere: Secondary | ICD-10-CM | POA: Diagnosis not present

## 2014-10-26 DIAGNOSIS — R270 Ataxia, unspecified: Secondary | ICD-10-CM | POA: Diagnosis not present

## 2014-10-26 DIAGNOSIS — J918 Pleural effusion in other conditions classified elsewhere: Secondary | ICD-10-CM | POA: Diagnosis not present

## 2014-10-26 DIAGNOSIS — J189 Pneumonia, unspecified organism: Secondary | ICD-10-CM | POA: Diagnosis not present

## 2014-10-26 DIAGNOSIS — M6281 Muscle weakness (generalized): Secondary | ICD-10-CM | POA: Diagnosis not present

## 2014-10-26 DIAGNOSIS — R262 Difficulty in walking, not elsewhere classified: Secondary | ICD-10-CM | POA: Diagnosis not present

## 2014-10-26 DIAGNOSIS — R279 Unspecified lack of coordination: Secondary | ICD-10-CM | POA: Diagnosis not present

## 2014-10-27 DIAGNOSIS — M6281 Muscle weakness (generalized): Secondary | ICD-10-CM | POA: Diagnosis not present

## 2014-10-27 DIAGNOSIS — R0600 Dyspnea, unspecified: Secondary | ICD-10-CM | POA: Diagnosis not present

## 2014-10-27 DIAGNOSIS — R071 Chest pain on breathing: Secondary | ICD-10-CM | POA: Diagnosis not present

## 2014-10-27 DIAGNOSIS — R278 Other lack of coordination: Secondary | ICD-10-CM | POA: Diagnosis not present

## 2014-10-27 DIAGNOSIS — J918 Pleural effusion in other conditions classified elsewhere: Secondary | ICD-10-CM | POA: Diagnosis not present

## 2014-10-27 DIAGNOSIS — J189 Pneumonia, unspecified organism: Secondary | ICD-10-CM | POA: Diagnosis not present

## 2014-10-27 DIAGNOSIS — R0602 Shortness of breath: Secondary | ICD-10-CM | POA: Diagnosis not present

## 2014-10-28 DIAGNOSIS — R0602 Shortness of breath: Secondary | ICD-10-CM | POA: Diagnosis not present

## 2014-10-28 DIAGNOSIS — M6281 Muscle weakness (generalized): Secondary | ICD-10-CM | POA: Diagnosis not present

## 2014-10-28 DIAGNOSIS — R278 Other lack of coordination: Secondary | ICD-10-CM | POA: Diagnosis not present

## 2014-10-28 DIAGNOSIS — R0600 Dyspnea, unspecified: Secondary | ICD-10-CM | POA: Diagnosis not present

## 2014-10-28 DIAGNOSIS — J189 Pneumonia, unspecified organism: Secondary | ICD-10-CM | POA: Diagnosis not present

## 2014-10-28 DIAGNOSIS — J918 Pleural effusion in other conditions classified elsewhere: Secondary | ICD-10-CM | POA: Diagnosis not present

## 2014-10-29 DIAGNOSIS — J189 Pneumonia, unspecified organism: Secondary | ICD-10-CM | POA: Diagnosis not present

## 2014-10-29 DIAGNOSIS — R278 Other lack of coordination: Secondary | ICD-10-CM | POA: Diagnosis not present

## 2014-10-29 DIAGNOSIS — M6281 Muscle weakness (generalized): Secondary | ICD-10-CM | POA: Diagnosis not present

## 2014-10-29 DIAGNOSIS — R0602 Shortness of breath: Secondary | ICD-10-CM | POA: Diagnosis not present

## 2014-10-29 DIAGNOSIS — J918 Pleural effusion in other conditions classified elsewhere: Secondary | ICD-10-CM | POA: Diagnosis not present

## 2014-10-29 DIAGNOSIS — R0600 Dyspnea, unspecified: Secondary | ICD-10-CM | POA: Diagnosis not present

## 2014-11-12 ENCOUNTER — Non-Acute Institutional Stay (SKILLED_NURSING_FACILITY): Payer: Medicare Other | Admitting: Adult Health

## 2014-11-12 DIAGNOSIS — R140 Abdominal distension (gaseous): Secondary | ICD-10-CM

## 2014-11-12 DIAGNOSIS — F0390 Unspecified dementia without behavioral disturbance: Secondary | ICD-10-CM

## 2014-11-12 DIAGNOSIS — I10 Essential (primary) hypertension: Secondary | ICD-10-CM

## 2014-11-12 DIAGNOSIS — M19011 Primary osteoarthritis, right shoulder: Secondary | ICD-10-CM | POA: Diagnosis not present

## 2014-11-12 DIAGNOSIS — E43 Unspecified severe protein-calorie malnutrition: Secondary | ICD-10-CM | POA: Diagnosis not present

## 2014-11-12 NOTE — Progress Notes (Signed)
Patient ID: [name], female   DOB: 06-05-1917, 81 y.o.   MRN: [id number]     Nursing Home Location:  [facility]: SNF [phone])  Chief Complaint  Patient presents with  . Medical Management of Chronic Issues    HPI:  81 y.o. female residing at [facility], skilled care section. I am her to review her chronic diseases. She has a hx of memory loss, debility, right pleural effusion, htn, protein calorie malnutrition, htn, OA, and GERD. Since our last visit she has had lability in her blood pressure. We changed to a pediatric cuff due to her size and at times the diastolic has been reading over 100. The staff is concerned that it is not accurate. She has not had any periods of SOB or CP. Her weight has continued to decrease, currently at 90.4 lbs. She has a lung nodule on the right but we are not working this up due to her age and debility.  She was started on simethicone for bloating in Feb and now denies any issues with bloating. She continues to have a poor appetite and chronic right shoulder pain. She feels the pain is controlled at this time, with Tylenol scheduled.   Review of Systems:  Review of Systems  Constitutional: Negative for fever, chills, diaphoresis, activity change, appetite change, fatigue and unexpected weight change.  HENT: Negative for congestion, postnasal drip, rhinorrhea and trouble swallowing.        HOH  Eyes:       Poor vision  Respiratory: Negative for apnea, cough, shortness of breath and wheezing.   Cardiovascular: Negative for chest pain, palpitations and leg swelling.  Gastrointestinal: Negative for nausea, vomiting, diarrhea, constipation, abdominal distention and rectal pain.       Bloating and excessive gas  Genitourinary: Negative for dysuria, flank pain and difficulty urinating.  Musculoskeletal: Positive for arthralgias and gait problem. Negative for back pain and joint swelling.   Chronic right shoulder pain  Skin: Negative for color change, rash and wound.  Neurological: Negative for dizziness, seizures, speech difficulty and numbness.  Psychiatric/Behavioral: Negative for confusion and agitation.    Medications: Patient's Medications  New Prescriptions   No medications on file  Previous Medications   ACETAMINOPHEN (TYLENOL) 500 MG TABLET    Take 1,000 mg by mouth 4 (four) times daily -  with meals and at bedtime.    CALCIUM CARB-CHOLECALCIFEROL (CALCIUM 600 + D PO)    Take 1 tablet by mouth 2 (two) times daily.   CETIRIZINE (ZYRTEC) 10 MG TABLET    Take 10 mg by mouth every morning.    FEEDING SUPPLEMENT, ENSURE COMPLETE, (ENSURE COMPLETE) LIQD    Take 237 mLs by mouth 3 (three) times daily between meals.   FLUTICASONE (VERAMYST) 27.5 MCG/SPRAY NASAL SPRAY    Place 1 spray into the nose daily.   HYDROCORTISONE (ANUSOL-HC) 2.5 % RECTAL CREAM    Place 1 application rectally as needed for hemorrhoids or itching.   MOEXIPRIL (UNIVASC) 15 MG TABLET    Take 15 mg by mouth every evening.   OMEPRAZOLE (PRILOSEC) 20 MG CAPSULE    Take 20 mg by mouth daily with lunch.    POLYETHYLENE GLYCOL (MIRALAX / GLYCOLAX) PACKET    Take 17 g by mouth every evening.    TRAMADOL (ULTRAM) 50 MG TABLET    Take 1 tablet (50 mg total) by mouth every 6 (six) hours as needed for moderate pain.  VITAMIN B-12 (CYANOCOBALAMIN) 1000 MCG TABLET    Take 1,000 mcg by mouth daily.    ZOLPIDEM (AMBIEN) 5 MG TABLET    Take 1 tablet (5 mg total) by mouth at bedtime.  Modified Medications   No medications on file  Discontinued Medications   No medications on file     Physical Exam:  Filed Vitals:   11/12/14 1412  BP: 141/87  Pulse: 84  Temp: 97.2 F (36.2 C)  Resp: 18  Weight: 90 lb 6.4 oz (41.005 kg)  SpO2: 94%    Physical Exam  Constitutional: No distress.  Frail  HENT:  Head: Normocephalic and atraumatic.  Neck: No JVD present. No tracheal deviation present.  Cardiovascular:  Normal rate, regular rhythm, normal heart sounds and intact distal pulses.   No murmur heard. Pulmonary/Chest: Effort normal and breath sounds normal. No respiratory distress. She has no wheezes. She has no rales.  Abdominal: Soft. Bowel sounds are normal. She exhibits no distension. There is no tenderness.  Musculoskeletal: She exhibits no edema or tenderness.  Lymphadenopathy:    She has no cervical adenopathy.  Neurological: She is alert. No cranial nerve deficit.  Oriented x2  Skin: Skin is warm and dry. No rash noted. She is not diaphoretic. No erythema.  Psychiatric: Affect normal.    Labs reviewed/Significant Diagnostic Results:  CXR PA and Lateral: 08/27/2014 :  1. Persistent infiltrate right lower lobe. 2. Persistent right pleural effusion.  CT of the chest 08/11/14 IMPRESSION: No significant change in the size of the multiloculated right pleural effusion. Locules of air are now present within the pleural space and may be related to recent thoracentesis. Bronchopleural fistula could have this appearance. However, given the recent thoracentesis, this is felt less likely. Atelectasis in both lower lobes, right greater than left. New trace left pleural effusion. Cardiomegaly. Old granulomatous disease.  CT of the chest 07/1014 IMPRESSION: 1. Moderate loculated RIGHT pleural effusion, likely producing shortness of breath. This produces substantial atelectasis of the RIGHT lower lobe and partial atelectasis of the RIGHT middle lobe. This could represent a parapneumonic effusion or malignant effusion. Ordinary effusion associated with CHF is also possible. 2. 7 mm RIGHT middle lobe pulmonary nodule. Although follow-up is typically recommended, this is at the discretion of the referring clinician in this patient of advanced age. Guideline recommendations as follows: If the patient is at high risk for bronchogenic carcinoma, follow-up chest CT at 3-12months is  recommended. If the patient is at low risk for bronchogenic carcinoma, follow-up chest CT at 6-12 months is recommended. This recommendation follows the consensus statement: Guidelines for Management of Small Pulmonary Nodules Detected on CT Scans: A Statement from the [place] as published in Radiology 2005; 237:395-400. 3. Severe atherosclerosis and coronary artery disease with ectasia of the ascending aorta. Again, follow-up is at the discretion of the referring clinician.  Basic Metabolic Panel:  Recent Labs  08/10/14 0535 08/11/14 0445 08/27/14 1022 09/08/14  NA 137 138 133* 136*  K 3.9 4.0 3.7 4.3  CL 103 102 99  --   CO2 23 24 24   --   GLUCOSE 96 89 117*  --   BUN 14 15 10 12   CREATININE 0.64 0.66 0.9 0.6  CALCIUM 9.2 9.3 9.4  --    Liver Function Tests:  Recent Labs  08/06/14 1817 08/07/14 0354 08/07/14 0958 08/27/14 1022  AST 14 14  --  20  ALT 12 11  --  11  ALKPHOS 163* 143*  --  125*  BILITOT 0.3 0.3  --  0.3  PROT 6.5 5.7* 5.5* 6.6  ALBUMIN 2.2* 1.9*  --  2.7*   No results for input(s): LIPASE, AMYLASE in the last 8760 hours. No results for input(s): AMMONIA in the last 8760 hours. CBC:  Recent Labs  08/06/14 1817 08/07/14 0354 08/11/14 0445 08/12/14 0433 08/27/14 1022 10/08/14  WBC 17.4* 14.0* 16.6* 16.8* 14.4* 9.0  NEUTROABS 14.4* 10.8*  --   --  11.1*  --   HGB 13.3 11.3* 12.6 12.6 12.6 12.3  HCT 38.8 33.3* 38.1 38.9 38.8 39  MCV 89.6 88.6 90.5 92.2 91.3  --   PLT 482* 532* 671* 642* 740.0* 443*     Assessment/Plan  1. Essential hypertension Resume use of adult manual cuff and record BP and pulse daily over the weekend and report to me on Monday.  2. Dementia, without behavioral disturbance Continues to have sitters in the morning, ambulates with walker and assistance. Stable memory loss, no meds due to age, debility,  and weight.  3. Primary osteoarthritis of right shoulder Stable, continue Tylenol  4. Protein-calorie  malnutrition, severe Continue ensure. Check TSH. If normal, consider Remeron for appetite stimulant  5. Bloating Improved, continue simethicone   Cindi Carbon, ANP [facility] [phone]

## 2014-11-17 DIAGNOSIS — E069 Thyroiditis, unspecified: Secondary | ICD-10-CM | POA: Diagnosis not present

## 2014-11-17 LAB — TSH: TSH: 4.26 u[IU]/mL (ref <=5.90)

## 2014-11-23 DIAGNOSIS — R5381 Other malaise: Secondary | ICD-10-CM | POA: Diagnosis not present

## 2014-11-23 DIAGNOSIS — E46 Unspecified protein-calorie malnutrition: Secondary | ICD-10-CM | POA: Diagnosis not present

## 2014-11-23 DIAGNOSIS — I10 Essential (primary) hypertension: Secondary | ICD-10-CM | POA: Diagnosis not present

## 2014-11-23 DIAGNOSIS — F0390 Unspecified dementia without behavioral disturbance: Secondary | ICD-10-CM | POA: Diagnosis not present

## 2014-11-23 LAB — BASIC METABOLIC PANEL
BUN: 16 mg/dL (ref 4–21)
Creatinine: 0.7 mg/dL (ref 0.5–1.1)
GLUCOSE: 91 mg/dL
Potassium: 4.6 mmol/L (ref 3.4–5.3)
Sodium: 136 mmol/L — AB (ref 137–147)

## 2014-12-10 ENCOUNTER — Non-Acute Institutional Stay (SKILLED_NURSING_FACILITY): Payer: Medicare Other | Admitting: Adult Health

## 2014-12-10 ENCOUNTER — Encounter: Payer: Self-pay | Admitting: Adult Health

## 2014-12-10 DIAGNOSIS — M19011 Primary osteoarthritis, right shoulder: Secondary | ICD-10-CM

## 2014-12-10 DIAGNOSIS — R634 Abnormal weight loss: Secondary | ICD-10-CM

## 2014-12-10 DIAGNOSIS — I10 Essential (primary) hypertension: Secondary | ICD-10-CM | POA: Diagnosis not present

## 2014-12-10 DIAGNOSIS — R140 Abdominal distension (gaseous): Secondary | ICD-10-CM | POA: Diagnosis not present

## 2014-12-10 DIAGNOSIS — F0390 Unspecified dementia without behavioral disturbance: Secondary | ICD-10-CM

## 2014-12-10 DIAGNOSIS — M19019 Primary osteoarthritis, unspecified shoulder: Secondary | ICD-10-CM | POA: Insufficient documentation

## 2014-12-10 NOTE — Progress Notes (Signed)
Patient ID: [name], female   DOB: 06/24/1917, 81 y.o.   MRN: [id number]     Nursing Home Location:  [facility]: SNF [phone])  Chief Complaint  Patient presents with  . Medical Management of Chronic Issues    HPI:  81 y.o. female residing at [facility], skilled care section. I am her to review her chronic diseases. She has a hx of memory loss, debility, right pleural effusion, htn, protein calorie malnutrition, htn, OA, lung nodule, and GERD. Since our last visit her BP has improved with an increase in Univasc. Her weight has not improved, currently at 92 lbs. She reports lack of appetite and intermittent difficulty sleeping. She also reports continued issues with bloating and gas and would like her simethicone increased. She is on a rather bland diet but does use ensure supplements. She is using ultram for pain to her joints, to include the right shoulder.   Review of Systems:  Review of Systems  Constitutional: Negative for fever, chills, diaphoresis, activity change, appetite change, fatigue and unexpected weight change.  HENT: Negative for congestion, postnasal drip, rhinorrhea and trouble swallowing.        HOH  Eyes:       Poor vision  Respiratory: Negative for apnea, cough, shortness of breath and wheezing.   Cardiovascular: Negative for chest pain, palpitations and leg swelling.  Gastrointestinal: Negative for nausea, vomiting, diarrhea, constipation, abdominal distention and rectal pain.       Bloating and excessive gas  Genitourinary: Negative for dysuria, flank pain and difficulty urinating.  Musculoskeletal: Positive for arthralgias and gait problem. Negative for back pain and joint swelling.       Chronic right shoulder pain  Skin: Negative for color change, rash and wound.  Neurological: Negative for dizziness, seizures, speech difficulty and numbness.  Psychiatric/Behavioral: Negative for confusion and  agitation.    Medications: Patient's Medications  New Prescriptions   No medications on file  Previous Medications   ACETAMINOPHEN (TYLENOL) 500 MG TABLET    Take 1,000 mg by mouth 4 (four) times daily -  with meals and at bedtime.    CALCIUM CARB-CHOLECALCIFEROL (CALCIUM 600 + D PO)    Take 1 tablet by mouth 2 (two) times daily.   CETIRIZINE (ZYRTEC) 10 MG TABLET    Take 10 mg by mouth every morning.    FEEDING SUPPLEMENT, ENSURE COMPLETE, (ENSURE COMPLETE) LIQD    Take 237 mLs by mouth 3 (three) times daily between meals.   FLUTICASONE (VERAMYST) 27.5 MCG/SPRAY NASAL SPRAY    Place 1 spray into the nose daily.   HYDROCORTISONE (ANUSOL-HC) 2.5 % RECTAL CREAM    Place 1 application rectally as needed for hemorrhoids or itching.   MIRTAZAPINE (REMERON) 7.5 MG TABLET    Take 7.5 mg by mouth at bedtime.   MOEXIPRIL (UNIVASC) 15 MG TABLET    Take 22.5 mg by mouth every evening.    OMEPRAZOLE (PRILOSEC) 20 MG CAPSULE    Take 20 mg by mouth daily with lunch.    POLYETHYLENE GLYCOL (MIRALAX / GLYCOLAX) PACKET    Take 17 g by mouth every evening.    SIMETHICONE (MYLICON) 80 MG CHEWABLE TABLET    Chew 160 mg by mouth 2 (two) times daily.   TRAMADOL (ULTRAM) 50 MG TABLET    Take 1 tablet (50 mg total) by mouth every 6 (six) hours as needed for moderate pain.   VITAMIN B-12 (CYANOCOBALAMIN) 1000 MCG TABLET  Take 1,000 mcg by mouth daily.    ZOLPIDEM (AMBIEN) 5 MG TABLET    Take 1 tablet (5 mg total) by mouth at bedtime.  Modified Medications   No medications on file  Discontinued Medications   No medications on file     Physical Exam:  Filed Vitals:   12/10/14 1610  BP: 147/89  Pulse: 94  Temp: 97.4 F (36.3 C)  Resp: 18  Weight: 92 lb 6.4 oz (41.912 kg)  SpO2: 97%    Physical Exam  Constitutional: No distress.  Frail  HENT:  Head: Normocephalic and atraumatic.  Neck: No JVD present. No tracheal deviation present.  Cardiovascular: Normal rate, regular rhythm, normal heart  sounds and intact distal pulses.   No murmur heard. Pulmonary/Chest: Effort normal and breath sounds normal. No respiratory distress. She has no wheezes. She has no rales.  Abdominal: Soft. Bowel sounds are normal. She exhibits no distension. There is no tenderness.  Musculoskeletal: She exhibits no edema or tenderness.  Lymphadenopathy:    She has no cervical adenopathy.  Neurological: She is alert. No cranial nerve deficit.  Oriented x2  Skin: Skin is warm and dry. No rash noted. She is not diaphoretic. No erythema.  Psychiatric: Affect normal.    Labs reviewed/Significant Diagnostic Results:  CXR PA and Lateral: 08/27/2014 :  1. Persistent infiltrate right lower lobe. 2. Persistent right pleural effusion.  CT of the chest 08/11/14 IMPRESSION: No significant change in the size of the multiloculated right pleural effusion. Locules of air are now present within the pleural space and may be related to recent thoracentesis. Bronchopleural fistula could have this appearance. However, given the recent thoracentesis, this is felt less likely. Atelectasis in both lower lobes, right greater than left. New trace left pleural effusion. Cardiomegaly. Old granulomatous disease.  CT of the chest 07/1014 IMPRESSION: 1. Moderate loculated RIGHT pleural effusion, likely producing shortness of breath. This produces substantial atelectasis of the RIGHT lower lobe and partial atelectasis of the RIGHT middle lobe. This could represent a parapneumonic effusion or malignant effusion. Ordinary effusion associated with CHF is also possible. 2. 7 mm RIGHT middle lobe pulmonary nodule. Although follow-up is typically recommended, this is at the discretion of the referring clinician in this patient of advanced age. Guideline recommendations as follows: If the patient is at high risk for bronchogenic carcinoma, follow-up chest CT at 3-36months is recommended. If the patient is at low risk for  bronchogenic carcinoma, follow-up chest CT at 6-12 months is recommended. This recommendation follows the consensus statement: Guidelines for Management of Small Pulmonary Nodules Detected on CT Scans: A Statement from the [place] as published in Radiology 2005; 237:395-400. 3. Severe atherosclerosis and coronary artery disease with ectasia of the ascending aorta. Again, follow-up is at the discretion of the referring clinician.  Basic Metabolic Panel:  Recent Labs  08/10/14 0535 08/11/14 0445 08/27/14 1022 09/08/14 11/23/14  NA 137 138 133* 136* 136*  K 3.9 4.0 3.7 4.3 4.6  CL 103 102 99  --   --   CO2 23 24 24   --   --   GLUCOSE 96 89 117*  --   --   BUN 14 15 10 12 16   CREATININE 0.64 0.66 0.9 0.6 0.7  CALCIUM 9.2 9.3 9.4  --   --    Liver Function Tests:  Recent Labs  08/06/14 1817 08/07/14 0354 08/07/14 0958 08/27/14 1022  AST 14 14  --  20  ALT 12 11  --  11  ALKPHOS 163* 143*  --  125*  BILITOT 0.3 0.3  --  0.3  PROT 6.5 5.7* 5.5* 6.6  ALBUMIN 2.2* 1.9*  --  2.7*   No results for input(s): LIPASE, AMYLASE in the last 8760 hours. No results for input(s): AMMONIA in the last 8760 hours. CBC:  Recent Labs  08/06/14 1817 08/07/14 0354 08/11/14 0445 08/12/14 0433 08/27/14 1022 10/08/14  WBC 17.4* 14.0* 16.6* 16.8* 14.4* 9.0  NEUTROABS 14.4* 10.8*  --   --  11.1*  --   HGB 13.3 11.3* 12.6 12.6 12.6 12.3  HCT 38.8 33.3* 38.1 38.9 38.8 39  MCV 89.6 88.6 90.5 92.2 91.3  --   PLT 482* 532* 671* 642* 740.0* 443*     Assessment/Plan  1. Essential hypertension Improved, BMP ok, continue Univasc  2. Dementia, without behavioral disturbance Continues to have sitters in the morning, ambulates with walker and assistance. Stable memory loss, no meds due to age, debility,  and weight.  3. Primary osteoarthritis of right shoulder Stable, continue Tylenol and ultram prn  4. Weight loss Begin Remeron 7.5 mg qhs, continue supplements  5.  Bloating Improved, increase simethicone to 160 mg BID   Cindi Carbon, ANP [facility] [phone]

## 2014-12-11 DIAGNOSIS — H43813 Vitreous degeneration, bilateral: Secondary | ICD-10-CM | POA: Diagnosis not present

## 2014-12-11 DIAGNOSIS — H3532 Exudative age-related macular degeneration: Secondary | ICD-10-CM | POA: Diagnosis not present

## 2014-12-11 DIAGNOSIS — H31013 Macula scars of posterior pole (postinflammatory) (post-traumatic), bilateral: Secondary | ICD-10-CM | POA: Diagnosis not present

## 2014-12-26 ENCOUNTER — Encounter: Payer: Self-pay | Admitting: Internal Medicine

## 2014-12-26 NOTE — Progress Notes (Signed)
Patient ID: [name], female   DOB: 11/22/1916, 81 y.o.   MRN: [id number]    HISTORY AND PHYSICAL  Location:  [facility] Room Number: 136 Place of Service: SNF (31)   Extended Emergency Contact Information Primary Emergency Contact: Powell,Katherine  [place] Phone: [phone] Mobile Phone: [phone] Relation: Other Secondary Emergency Contact: Thatcher,Shirley Address: [address]  [place] Phone: [phone] Mobile Phone: [phone] Relation: Other  Advanced Directive information Does patient have an advance directive?: Yes, Type of Advance Directive: Out of facility DNR (pink MOST or yellow form);Healthcare Power of Attorney, Pre-existing out of facility DNR order (yellow form or pink MOST form): Yellow form placed in chart (order not valid for inpatient use) (At [facility]), Does patient want to make changes to advanced directive?: No - Patient declined  Chief Complaint  Patient presents with  . New Admit To SNF    following hospitalization    HPI:  Admitted to skilled nursing facility 08/12/2014. Hospitalized 08/06/2014 through 08/12/2014 for healthcare acquired pneumonia. Treated with cefepime and vancomycin. She was switched to oral Levaquin prior to discharge from the hospital.  Lung nodule in the right middle lobe was found measuring 7 mm. Further outpatient follow-up is recommended.  Pleural effusions or heart thoracentesis 2. Cytology was negative for malignant cells.  Multiple other active problems include hypertension, GERD, severe protein calorie malnutrition, skin tears of the right lower anterior leg, and unstable gait, partial deafness, right shoulder pain due to glenohumeral osteoarthritis.  Patient has significant dementia with her last MMSE measuring 19/30.  She complains of difficulty initiating urination now.  Past Medical History  Diagnosis Date    . Arthritis   . Carpal tunnel syndrome on left   . DJD (degenerative joint disease)   . Diverticulosis   . Macular degeneration syndrome   . GERD (gastroesophageal reflux disease)   . Headache(784.0)   . Wears glasses   . Wears hearing aid     both  . Wears partial dentures     top and bottom  . High blood pressure     echo 09-normal  . Lower GI bleed 06/2013    required 2 units PRBC  . Pleural effusion 08/07/2014    Past Surgical History  Procedure Laterality Date  . Appendectomy    . Hip surgery      left  . Knee surgery    . Tonsillectomy    . Thyroid cyst excision    . Eye surgery      cataractas  . Carpal tunnel release  2/12    rt  . Joint replacement  2006    rt total hip x2  . Carpal tunnel release  04/23/2012    Procedure: CARPAL TUNNEL RELEASE;  Surgeon: Wynonia Sours, MD;  Location: [facility];  Service: Orthopedics;  Laterality: Left;    Patient Care Team: Lajean Manes, MD as PCP - General (Internal Medicine) [facility]  History   Social History  . Marital Status: Single    Spouse Name: N/A  . Number of Children: N/A  . Years of Education: N/A   Occupational History  . Not on file.   Social History Main Topics  . Smoking status: Former Smoker    Quit date: 07/07/1973  . Smokeless tobacco: Never Used  . Alcohol Use: Yes  . Drug Use: Not on file  . Sexual Activity: No   Other  Topics Concern  . Not on file   Social History Narrative   Patient is Widowed.  Occupation: Housewife  Lives in single level home ([facility]) at [place] since 1993. Lives alone, has caregiver assistance &-12noon, 5-9pm daily [place]).    Stopped smoking 1970s. Minimal alcohol history   Patient has Advanced planning documents:  Living Will, DNR, HCPOA Danie Binder)                       reports that she quit smoking about 41 years ago. She has never used smokeless tobacco. She reports  that she drinks alcohol. Her drug history is not on file.  Family History  Problem Relation Age of Onset  . Heart attack Mother   . Diabetes Father    Family Status  Relation Status Death Age  . Mother Deceased   . Father Deceased   . Daughter Deceased 77    MI    Immunization History  Administered Date(s) Administered  . Influenza Split 05/28/2014  . Pneumococcal Conjugate-13 03/03/2014    Allergies  Allergen Reactions  . Celebrex [Celecoxib] Itching  . Morphine And Related Nausea Only  . Vicodin [Hydrocodone-Acetaminophen] Itching  . Sulfa Antibiotics Rash    Medications: Patient's Medications  New Prescriptions   No medications on file  Previous Medications   ACETAMINOPHEN (TYLENOL) 500 MG TABLET    Take 1,000 mg by mouth 4 (four) times daily -  with meals and at bedtime.    CALCIUM CARB-CHOLECALCIFEROL (CALCIUM 600 + D PO)    Take 1 tablet by mouth 2 (two) times daily.   CETIRIZINE (ZYRTEC) 10 MG TABLET    Take 10 mg by mouth every morning.    FEEDING SUPPLEMENT, ENSURE COMPLETE, (ENSURE COMPLETE) LIQD    Take 237 mLs by mouth 3 (three) times daily between meals.   FLUTICASONE (VERAMYST) 27.5 MCG/SPRAY NASAL SPRAY    Place 1 spray into the nose daily.   HYDROCORTISONE (ANUSOL-HC) 2.5 % RECTAL CREAM    Place 1 application rectally as needed for hemorrhoids or itching.   MIRTAZAPINE (REMERON) 7.5 MG TABLET    Take 7.5 mg by mouth at bedtime.   MOEXIPRIL (UNIVASC) 15 MG TABLET    Take 22.5 mg by mouth every evening.    OMEPRAZOLE (PRILOSEC) 20 MG CAPSULE    Take 20 mg by mouth daily with lunch.    POLYETHYLENE GLYCOL (MIRALAX / GLYCOLAX) PACKET    Take 17 g by mouth every evening.    SIMETHICONE (MYLICON) 80 MG CHEWABLE TABLET    Chew 160 mg by mouth 2 (two) times daily.   TRAMADOL (ULTRAM) 50 MG TABLET    Take 1 tablet (50 mg total) by mouth every 6 (six) hours as needed for moderate pain.   VITAMIN B-12 (CYANOCOBALAMIN) 1000 MCG TABLET    Take 1,000 mcg by mouth  daily.    ZOLPIDEM (AMBIEN) 5 MG TABLET    Take 1 tablet (5 mg total) by mouth at bedtime.  Modified Medications   No medications on file  Discontinued Medications   No medications on file    Review of Systems  Constitutional: Negative for fever, chills, diaphoresis, activity change, appetite change, fatigue and unexpected weight change.  HENT: Positive for hearing loss. Negative for congestion, ear discharge, ear pain, postnasal drip, rhinorrhea, sinus pressure, sore throat, tinnitus, trouble swallowing and voice change.   Eyes: Negative for pain, redness, itching and visual disturbance.  Respiratory: Positive for  shortness of breath. Negative for cough, choking, wheezing and stridor.        Pain on inspiration  Cardiovascular: Negative for chest pain, palpitations and leg swelling.  Gastrointestinal: Negative for nausea, abdominal pain, diarrhea, constipation and abdominal distention.  Endocrine: Negative for cold intolerance, heat intolerance, polydipsia, polyphagia and polyuria.  Genitourinary: Positive for difficulty urinating (Difficulty with initiation of urination). Negative for dysuria, urgency, frequency, hematuria, flank pain, vaginal discharge and pelvic pain.  Musculoskeletal: Negative for myalgias, back pain, arthralgias, gait problem, neck pain and neck stiffness.  Skin: Negative for color change, pallor and rash.  Allergic/Immunologic: Negative.   Neurological: Negative for dizziness, tremors, seizures, syncope, speech difficulty, weakness, light-headedness, numbness and headaches.  Hematological: Negative for adenopathy. Does not bruise/bleed easily.  Psychiatric/Behavioral: Positive for confusion. Negative for suicidal ideas, hallucinations, behavioral problems, sleep disturbance, dysphoric mood and agitation. The patient is not nervous/anxious and is not hyperactive.     Filed Vitals:   12/26/14 1247  BP: 112/68  Pulse: 103  Temp: 98.2 F (36.8 C)  Resp: 16    Height: 5\' 3"  (1.6 m)  Weight: 98 lb (44.453 kg)   Body mass index is 17.36 kg/(m^2).  Physical Exam  Constitutional: She is oriented to person, place, and time. She appears well-developed and well-nourished. No distress.  Then, frail  HENT:  Right Ear: External ear normal.  Left Ear: External ear normal.  Nose: Nose normal.  Mouth/Throat: Oropharynx is clear and moist. No oropharyngeal exudate.  Partial hearing loss bilaterally  Eyes: Conjunctivae and EOM are normal. Pupils are equal, round, and reactive to light. No scleral icterus.  Corrective lenses  Neck: No JVD present. No tracheal deviation present. No thyromegaly present.  Anterior scar;? Previous thyroid surgery  Cardiovascular: Normal rate, regular rhythm, normal heart sounds and intact distal pulses.  Exam reveals no gallop and no friction rub.   No murmur heard. Pulmonary/Chest: Effort normal. No respiratory distress. She has no wheezes. She has no rales. She exhibits no tenderness.  Decreased breath sounds in the lower right lung  Abdominal: She exhibits no distension and no mass. There is no tenderness.  Musculoskeletal: Normal range of motion. She exhibits no edema or tenderness.  Osteoarthritic changes in hands and fingers  Lymphadenopathy:    She has no cervical adenopathy.  Neurological: She is alert and oriented to person, place, and time. No cranial nerve deficit. Coordination normal.  Loss of memory  Skin: No rash noted. She is not diaphoretic. No erythema. No pallor.  Skin tear right lower extremity  Psychiatric: She has a normal mood and affect. Her behavior is normal. Thought content normal.     Labs reviewed: Admission on 08/06/2014, Discharged on 08/12/2014  No results displayed because visit has over 200 results.      08/06/14 CT chest: IMPRESSION: 1. Moderate loculated RIGHT pleural effusion, likely producing shortness of breath. This produces substantial atelectasis of the RIGHT lower lobe  and partial atelectasis of the RIGHT middle lobe. This could represent a parapneumonic effusion or malignant effusion. Ordinary effusion associated with CHF is also possible. 2. 7 mm RIGHT middle lobe pulmonary nodule. Although follow-up is typically recommended, this is at the discretion of the referring clinician in this patient of advanced age. Guideline recommendations as follows: If the patient is at high risk for bronchogenic carcinoma, follow-up chest CT at 3-40months is recommended. If the patient is at low risk for bronchogenic carcinoma, follow-up chest CT at 6-12 months is recommended. This recommendation follows the  consensus statement: Guidelines for Management of Small Pulmonary Nodules Detected on CT Scans: A Statement from the [place] as published in Radiology 2005; 237:395-400. 3. Severe atherosclerosis and coronary artery disease with ectasia of the ascending aorta. Again, follow-up is at the discretion of the referring clinician. Guideline recommendations as follows: Ectatic to mildly aneurysmal ascending thoracic aorta. Recommend annual imaging followup by CTA or MRA. This recommendation follows 2010 ACCF/AHA/AATS/ACR/ASA/SCA/SCAI/SIR/STS/SVM Guidelines for the Diagnosis and Management of Patients With Thoracic Aortic Disease. Circulation. 2010; 121: L875-I433  08/11/14 CT chest: IMPRESSION: No significant change in the size of the multiloculated right pleural effusion. Locules of air are now present within the pleural space and may be related to recent thoracentesis. Bronchopleural fistula could have this appearance. However, given the recent thoracentesis, this is felt less likely.  Atelectasis in both lower lobes, right greater than left.  New trace left pleural effusion.  Cardiomegaly. Old granulomatous disease.   Assessment/Plan  1. HCAP (healthcare-associated pneumonia) Continues to recover from recent pneumonia. Needs to finish  Levaquin. Follow-up chest x-rays are indicated. Remains on oxygen. Will institute weaning as she improves.  2. Lung nodule Last CT of the chest done 08/11/2014. Per radiology suggestions, we should schedule follow-up CT of the chest next spring.  3. Protein-calorie malnutrition, severe Continued attention to adequate intake and caloric supplements.  4. Essential hypertension Controlled  5. Dementia, without behavioral disturbance Patient is likely long-term care patient due to the progression of her dementia.  6. Debility Engaged in physical therapy and occupational therapy to try to improve strength, safe mobility, and self-care skills.  7. Pleural effusion Patient had thoracentesis twice during her hospital stay. No malignancy was found and cell submitted for Pap smear.

## 2015-01-07 ENCOUNTER — Encounter: Payer: Self-pay | Admitting: Adult Health

## 2015-01-07 ENCOUNTER — Non-Acute Institutional Stay (SKILLED_NURSING_FACILITY): Payer: Medicare Other | Admitting: Adult Health

## 2015-01-07 DIAGNOSIS — G4700 Insomnia, unspecified: Secondary | ICD-10-CM | POA: Diagnosis not present

## 2015-01-07 DIAGNOSIS — M19011 Primary osteoarthritis, right shoulder: Secondary | ICD-10-CM | POA: Diagnosis not present

## 2015-01-07 DIAGNOSIS — R634 Abnormal weight loss: Secondary | ICD-10-CM | POA: Diagnosis not present

## 2015-01-07 DIAGNOSIS — I10 Essential (primary) hypertension: Secondary | ICD-10-CM | POA: Diagnosis not present

## 2015-01-07 NOTE — Progress Notes (Signed)
Patient ID: [name], female   DOB: 1916/11/16, 81 y.o.   MRN: [id number]   Code status DNR   Nursing Home Location:  [place] of Service: SNF (31)  Chief Complaint  Patient presents with  . Medical Management of Chronic Issues    HPI:  81 y.o. female residing at [facility], skilled care section. I am her to review her chronic diseases. She has a hx of memory loss, debility, right pleural effusion, htn, protein calorie malnutrition, htn, OA, lung nodule, and GERD. She is frail and uses a walker for ambulation but only goes short distances with assistance. She has a caregiver during the day who is also her POA. She was started on Remeron for weight loss last month and has gained 4 lbs. She reports that she is sleeping well. Her BP is better controlled today, currently at 147/89.  She has had issues with labile BP in the past. She denies SOB or CP.   Review of Systems:  Review of Systems  Constitutional: Negative for fever, chills, diaphoresis, activity change, appetite change, fatigue and unexpected weight change.  HENT: Negative for congestion, postnasal drip, rhinorrhea and trouble swallowing.        HOH  Eyes:       Poor vision  Respiratory: Negative for apnea, cough, shortness of breath and wheezing.   Cardiovascular: Negative for chest pain, palpitations and leg swelling.  Gastrointestinal: Negative for nausea, vomiting, diarrhea, constipation, abdominal distention and rectal pain.       Bloating and excessive gas  Genitourinary: Negative for dysuria, flank pain and difficulty urinating.  Musculoskeletal: Positive for arthralgias and gait problem. Negative for back pain and joint swelling.       Chronic right shoulder pain  Skin: Negative for color change, rash and wound.  Neurological: Negative for dizziness, seizures, speech difficulty and numbness.  Psychiatric/Behavioral: Negative for confusion and agitation.     Medications: Patient's Medications  New Prescriptions   No medications on file  Previous Medications   ACETAMINOPHEN (TYLENOL) 500 MG TABLET    Take 1,000 mg by mouth 4 (four) times daily -  with meals and at bedtime.    CALCIUM CARB-CHOLECALCIFEROL (CALCIUM 600 + D PO)    Take 1 tablet by mouth 2 (two) times daily.   CETIRIZINE (ZYRTEC) 10 MG TABLET    Take 10 mg by mouth every morning.    FEEDING SUPPLEMENT, ENSURE COMPLETE, (ENSURE COMPLETE) LIQD    Take 237 mLs by mouth 3 (three) times daily between meals.   FLUTICASONE (VERAMYST) 27.5 MCG/SPRAY NASAL SPRAY    Place 1 spray into the nose daily.   HYDROCORTISONE (ANUSOL-HC) 2.5 % RECTAL CREAM    Place 1 application rectally as needed for hemorrhoids or itching.   MIRTAZAPINE (REMERON) 7.5 MG TABLET    Take 7.5 mg by mouth at bedtime.   MOEXIPRIL (UNIVASC) 15 MG TABLET    Take 22.5 mg by mouth every evening.    OMEPRAZOLE (PRILOSEC) 20 MG CAPSULE    Take 20 mg by mouth daily with lunch.    POLYETHYLENE GLYCOL (MIRALAX / GLYCOLAX) PACKET    Take 17 g by mouth every evening.    SIMETHICONE (MYLICON) 80 MG CHEWABLE TABLET    Chew 160 mg by mouth 2 (two) times daily.   TRAMADOL (ULTRAM) 50 MG TABLET    Take 1 tablet (50 mg total) by mouth every 6 (six) hours as needed for moderate pain.  VITAMIN B-12 (CYANOCOBALAMIN) 1000 MCG TABLET    Take 1,000 mcg by mouth daily.    ZOLPIDEM (AMBIEN) 5 MG TABLET    Take 1 tablet (5 mg total) by mouth at bedtime.  Modified Medications   No medications on file  Discontinued Medications   No medications on file     Physical Exam:  Filed Vitals:   01/07/15 1150  BP: 147/89  Pulse: 94  Temp: 97.4 F (36.3 C)  Resp: 18  Weight: 94 lb 3.2 oz (42.729 kg)  SpO2: 97%    Physical Exam  Constitutional: No distress.  Frail  HENT:  Head: Normocephalic and atraumatic.  Neck: No JVD present. No tracheal deviation present.  Cardiovascular: Normal rate, regular rhythm, normal heart sounds and  intact distal pulses.   No murmur heard. Pulmonary/Chest: Effort normal and breath sounds normal. No respiratory distress. She has no wheezes. She has no rales.  Abdominal: Soft. Bowel sounds are normal. She exhibits no distension. There is no tenderness.  Musculoskeletal: She exhibits no edema or tenderness.  Lymphadenopathy:    She has no cervical adenopathy.  Neurological: She is alert. No cranial nerve deficit.  Oriented x2  Skin: Skin is warm and dry. No rash noted. She is not diaphoretic. No erythema.  Psychiatric: Affect normal.    Labs reviewed/Significant Diagnostic Results:  CXR PA and Lateral: 08/27/2014 :  1. Persistent infiltrate right lower lobe. 2. Persistent right pleural effusion.  CT of the chest 08/11/14 IMPRESSION: No significant change in the size of the multiloculated right pleural effusion. Locules of air are now present within the pleural space and may be related to recent thoracentesis. Bronchopleural fistula could have this appearance. However, given the recent thoracentesis, this is felt less likely. Atelectasis in both lower lobes, right greater than left. New trace left pleural effusion. Cardiomegaly. Old granulomatous disease.  CT of the chest 07/1014 IMPRESSION: 1. Moderate loculated RIGHT pleural effusion, likely producing shortness of breath. This produces substantial atelectasis of the RIGHT lower lobe and partial atelectasis of the RIGHT middle lobe. This could represent a parapneumonic effusion or malignant effusion. Ordinary effusion associated with CHF is also possible. 2. 7 mm RIGHT middle lobe pulmonary nodule. Although follow-up is typically recommended, this is at the discretion of the referring clinician in this patient of advanced age. Guideline recommendations as follows: If the patient is at high risk for bronchogenic carcinoma, follow-up chest CT at 3-41months is recommended. If the patient is at low risk for bronchogenic  carcinoma, follow-up chest CT at 6-12 months is recommended. This recommendation follows the consensus statement: Guidelines for Management of Small Pulmonary Nodules Detected on CT Scans: A Statement from the [place] as published in Radiology 2005; 237:395-400. 3. Severe atherosclerosis and coronary artery disease with ectasia of the ascending aorta. Again, follow-up is at the discretion of the referring clinician.  Basic Metabolic Panel:  Recent Labs  08/10/14 0535 08/11/14 0445 08/27/14 1022 09/08/14 11/23/14  NA 137 138 133* 136* 136*  K 3.9 4.0 3.7 4.3 4.6  CL 103 102 99  --   --   CO2 23 24 24   --   --   GLUCOSE 96 89 117*  --   --   BUN 14 15 10 12 16   CREATININE 0.64 0.66 0.9 0.6 0.7  CALCIUM 9.2 9.3 9.4  --   --    Liver Function Tests:  Recent Labs  08/06/14 1817 08/07/14 0354 08/07/14 0958 08/27/14 1022  AST 14 14  --  20  ALT 12 11  --  11  ALKPHOS 163* 143*  --  125*  BILITOT 0.3 0.3  --  0.3  PROT 6.5 5.7* 5.5* 6.6  ALBUMIN 2.2* 1.9*  --  2.7*   No results for input(s): LIPASE, AMYLASE in the last 8760 hours. No results for input(s): AMMONIA in the last 8760 hours. CBC:  Recent Labs  08/06/14 1817 08/07/14 0354 08/11/14 0445 08/12/14 0433 08/27/14 1022 10/08/14  WBC 17.4* 14.0* 16.6* 16.8* 14.4* 9.0  NEUTROABS 14.4* 10.8*  --   --  11.1*  --   HGB 13.3 11.3* 12.6 12.6 12.6 12.3  HCT 38.8 33.3* 38.1 38.9 38.8 39  MCV 89.6 88.6 90.5 92.2 91.3  --   PLT 482* 532* 671* 642* 740.0* 443*     Assessment/Plan  1. Loss of weight Improved, continue Remeron at 7.5 mg qhs and monitor weight  2. Essential hypertension Improved, continue Univasc.  Monitor BMP continue periodically.   3.  OA to the right shoulder Controlled with Tylenol.   4. Insomnia Continue Ambien. Improved sleep with the addition of Remeron.    Cindi Carbon, ANP [facility] [phone]

## 2015-02-15 ENCOUNTER — Non-Acute Institutional Stay (SKILLED_NURSING_FACILITY): Payer: Medicare Other | Admitting: Adult Health

## 2015-02-15 ENCOUNTER — Encounter: Payer: Self-pay | Admitting: Adult Health

## 2015-02-15 DIAGNOSIS — R634 Abnormal weight loss: Secondary | ICD-10-CM | POA: Diagnosis not present

## 2015-02-15 DIAGNOSIS — R140 Abdominal distension (gaseous): Secondary | ICD-10-CM

## 2015-02-15 DIAGNOSIS — M19011 Primary osteoarthritis, right shoulder: Secondary | ICD-10-CM

## 2015-02-15 DIAGNOSIS — I10 Essential (primary) hypertension: Secondary | ICD-10-CM

## 2015-02-15 DIAGNOSIS — G4700 Insomnia, unspecified: Secondary | ICD-10-CM

## 2015-02-15 NOTE — Progress Notes (Signed)
Patient ID: [name], female   DOB: Dec 16, 1916, 81 y.o.   MRN: [id number]   Code status DNR   Nursing Home Location:  [place] of Service: SNF (31)  Chief Complaint  Patient presents with  . Medical Management of Chronic Issues    HPI:  81 y.o. female residing at [facility], skilled care section. I am her to review her chronic diseases. She has a hx of memory loss, debility, right pleural effusion, htn, protein calorie malnutrition, htn, OA, and GERD. She is currently on Remeron for weight loss and her weight has not changed in the past month. She uses ensure three times daily as well. She has issues with bloating and gas and is currently on simethicone. She reports that she still has gas but she thinks the med helps. She has chronic right shoulder pain and stiffness and uses tylenol with some relief.  She has been using [place] for quite some time for insomnia and this was stopped recently due to insurance non coverage.  She was not able to sleep without it and so they elected to pay for it out of pocket.    Review of Systems:  Review of Systems  Constitutional: Negative for fever, chills, diaphoresis, activity change, appetite change, fatigue and unexpected weight change.  HENT: Negative for congestion, postnasal drip, rhinorrhea and trouble swallowing.        HOH  Eyes:       Poor vision  Respiratory: Negative for apnea, cough, shortness of breath and wheezing.   Cardiovascular: Negative for chest pain, palpitations and leg swelling.  Gastrointestinal: Negative for nausea, vomiting, diarrhea, constipation, abdominal distention and rectal pain.       Bloating and excessive gas  Genitourinary: Negative for dysuria, flank pain and difficulty urinating.  Musculoskeletal: Positive for arthralgias and gait problem. Negative for back pain and joint swelling.       Chronic right shoulder pain  Skin: Negative for color change,  rash and wound.  Neurological: Negative for dizziness, seizures, speech difficulty and numbness.  Psychiatric/Behavioral: Negative for confusion and agitation.    Medications: Patient's Medications  New Prescriptions   No medications on file  Previous Medications   ACETAMINOPHEN (TYLENOL) 500 MG TABLET    Take 1,000 mg by mouth 2 (two) times daily.    CALCIUM CARB-CHOLECALCIFEROL (CALCIUM 600 + D PO)    Take 1 tablet by mouth 2 (two) times daily.   CETIRIZINE (ZYRTEC) 10 MG TABLET    Take 10 mg by mouth every morning.    FEEDING SUPPLEMENT, ENSURE COMPLETE, (ENSURE COMPLETE) LIQD    Take 237 mLs by mouth 3 (three) times daily between meals.   FLUTICASONE (VERAMYST) 27.5 MCG/SPRAY NASAL SPRAY    Place 1 spray into the nose daily.   HYDROCORTISONE (ANUSOL-HC) 2.5 % RECTAL CREAM    Place 1 application rectally as needed for hemorrhoids or itching.   MIRTAZAPINE (REMERON) 15 MG TABLET    Take 15 mg by mouth at bedtime.   MOEXIPRIL (UNIVASC) 15 MG TABLET    Take 22.5 mg by mouth every evening.    OMEPRAZOLE (PRILOSEC) 20 MG CAPSULE    Take 20 mg by mouth daily with lunch.    POLYETHYLENE GLYCOL (MIRALAX / GLYCOLAX) PACKET    Take 17 g by mouth every evening.    SIMETHICONE (MYLICON) 80 MG CHEWABLE TABLET    Chew 160 mg by mouth 2 (two) times daily.   TRAMADOL (ULTRAM) 50  MG TABLET    Take 1 tablet (50 mg total) by mouth every 6 (six) hours as needed for moderate pain.   VITAMIN B-12 (CYANOCOBALAMIN) 1000 MCG TABLET    Take 1,000 mcg by mouth daily.    ZOLPIDEM (AMBIEN) 5 MG TABLET    Take 1 tablet (5 mg total) by mouth at bedtime.  Modified Medications   No medications on file  Discontinued Medications   MIRTAZAPINE (REMERON) 7.5 MG TABLET    Take 7.5 mg by mouth at bedtime.     Physical Exam:  Filed Vitals:   02/15/15 1509  BP: 104/59  Pulse: 52  Temp: 98.4 F (36.9 C)  Resp: 20  Weight: 94 lb (42.638 kg)  SpO2: 96%    Physical Exam  Constitutional: No distress.  Frail    HENT:  Head: Normocephalic and atraumatic.  Neck: No JVD present. No tracheal deviation present.  Cardiovascular: Normal rate, regular rhythm, normal heart sounds and intact distal pulses.   No murmur heard. Pulmonary/Chest: Effort normal and breath sounds normal. No respiratory distress. She has no wheezes. She has no rales.  Abdominal: Soft. Bowel sounds are normal. She exhibits no distension. There is no tenderness.  Musculoskeletal: She exhibits no edema or tenderness.  Right shoulder with decreased ROM and pain on ROM  Lymphadenopathy:    She has no cervical adenopathy.  Neurological: She is alert. No cranial nerve deficit.  Oriented x2  Skin: Skin is warm and dry. No rash noted. She is not diaphoretic. No erythema.  Psychiatric: Affect normal.    Labs reviewed/Significant Diagnostic Results:   Basic Metabolic Panel:  Recent Labs  08/10/14 0535 08/11/14 0445 08/27/14 1022 09/08/14 11/23/14  NA 137 138 133* 136* 136*  K 3.9 4.0 3.7 4.3 4.6  CL 103 102 99  --   --   CO2 23 24 24   --   --   GLUCOSE 96 89 117*  --   --   BUN 14 15 10 12 16   CREATININE 0.64 0.66 0.9 0.6 0.7  CALCIUM 9.2 9.3 9.4  --   --    Liver Function Tests:  Recent Labs  08/06/14 1817 08/07/14 0354 08/07/14 0958 08/27/14 1022  AST 14 14  --  20  ALT 12 11  --  11  ALKPHOS 163* 143*  --  125*  BILITOT 0.3 0.3  --  0.3  PROT 6.5 5.7* 5.5* 6.6  ALBUMIN 2.2* 1.9*  --  2.7*   No results for input(s): LIPASE, AMYLASE in the last 8760 hours. No results for input(s): AMMONIA in the last 8760 hours. CBC:  Recent Labs  08/06/14 1817 08/07/14 0354 08/11/14 0445 08/12/14 0433 08/27/14 1022 10/08/14  WBC 17.4* 14.0* 16.6* 16.8* 14.4* 9.0  NEUTROABS 14.4* 10.8*  --   --  11.1*  --   HGB 13.3 11.3* 12.6 12.6 12.6 12.3  HCT 38.8 33.3* 38.1 38.9 38.8 39  MCV 89.6 88.6 90.5 92.2 91.3  --   PLT 482* 532* 671* 642* 740.0* 443*   Lab Results  Component Value Date   TSH 4.26 11/17/2014       Assessment/Plan  1. Essential hypertension Stable on current meds without edema. Monitor BMP q 6 months  2. Bloating Stable, continue simethicone  3. Primary osteoarthritis of right shoulder Stable, continue Tylenol  4. Insomnia Stable, continue ambien  5. Loss of weight Increase Remeron to 15 mg qhs      Cindi Carbon, ANP [facility] [phone]  [phone]    

## 2015-03-08 ENCOUNTER — Other Ambulatory Visit: Payer: Self-pay | Admitting: *Deleted

## 2015-03-08 MED ORDER — ZOLPIDEM TARTRATE 5 MG PO TABS: ORAL_TABLET | ORAL | Status: DC

## 2015-03-08 NOTE — Telephone Encounter (Signed)
[facility] 

## 2015-03-18 ENCOUNTER — Non-Acute Institutional Stay (SKILLED_NURSING_FACILITY): Payer: Medicare Other | Admitting: Adult Health

## 2015-03-18 ENCOUNTER — Encounter: Payer: Self-pay | Admitting: Adult Health

## 2015-03-18 DIAGNOSIS — K2100 Gastro-esophageal reflux disease with esophagitis, without bleeding: Secondary | ICD-10-CM

## 2015-03-18 DIAGNOSIS — R634 Abnormal weight loss: Secondary | ICD-10-CM

## 2015-03-18 DIAGNOSIS — I10 Essential (primary) hypertension: Secondary | ICD-10-CM

## 2015-03-18 DIAGNOSIS — M25579 Pain in unspecified ankle and joints of unspecified foot: Secondary | ICD-10-CM | POA: Diagnosis not present

## 2015-03-18 DIAGNOSIS — R140 Abdominal distension (gaseous): Secondary | ICD-10-CM | POA: Diagnosis not present

## 2015-03-18 NOTE — Progress Notes (Signed)
Patient ID: [name], female   DOB: Jul 03, 1917, 81 y.o.   MRN: [id number]    Code status DNR   Nursing Home Location:  [place] of Service: SNF (31)  Chief Complaint  Patient presents with  . Medical Management of Chronic Issues    HPI:  81 y.o. female residing at [facility], skilled care section. I am her to review her chronic diseases. She has a hx of memory loss, debility, right pleural effusion, htn, protein calorie malnutrition, htn, OA, constipation, GIB, and GERD. She is currently on Remeron for weight loss and has lost 2 more lbs since our last visit and reports no appetite. She is mostly sedentary and rather frail.  She has issues with bloating and gas and is currently on simethicone. She reports that she still has gas but she thinks the med helps. She is reporting severe pain in her left mid foot. She states that she uses pain med for this but has only used ultram 1 time in the past two weeks. The pain is described as aching, could occur at rest or with activity, and feels like something is "squeezing her foot".    Functional status: most sits in the recline, can walk with assistance and a walker   Review of Systems:  Review of Systems  Constitutional: Negative for fever, chills, diaphoresis, activity change, appetite change, fatigue and unexpected weight change.  HENT: Negative for congestion, postnasal drip, rhinorrhea and trouble swallowing.        HOH  Eyes:       Poor vision  Respiratory: Negative for apnea, cough, shortness of breath and wheezing.   Cardiovascular: Negative for chest pain, palpitations and leg swelling.  Gastrointestinal: Negative for nausea, vomiting, diarrhea, constipation, abdominal distention and rectal pain.       Bloating and excessive gas  Genitourinary: Negative for dysuria, flank pain and difficulty urinating.  Musculoskeletal: Positive for arthralgias and gait problem. Negative for  back pain and joint swelling.       Chronic right shoulder pain. New foot pain   Skin: Negative for color change, rash and wound.  Neurological: Negative for dizziness, seizures, speech difficulty and numbness.  Psychiatric/Behavioral: Negative for confusion and agitation.    Medications: Patient's Medications  New Prescriptions   No medications on file  Previous Medications   ACETAMINOPHEN (TYLENOL) 500 MG TABLET    Take 1,000 mg by mouth 2 (two) times daily.    CALCIUM CARB-CHOLECALCIFEROL (CALCIUM 600 + D PO)    Take 1 tablet by mouth 2 (two) times daily.   CETIRIZINE (ZYRTEC) 10 MG TABLET    Take 10 mg by mouth every morning.    FEEDING SUPPLEMENT, ENSURE COMPLETE, (ENSURE COMPLETE) LIQD    Take 237 mLs by mouth 3 (three) times daily between meals.   FLUTICASONE (VERAMYST) 27.5 MCG/SPRAY NASAL SPRAY    Place 1 spray into the nose daily.   HYDROCORTISONE (ANUSOL-HC) 2.5 % RECTAL CREAM    Place 1 application rectally as needed for hemorrhoids or itching.   MIRTAZAPINE (REMERON) 15 MG TABLET    Take 15 mg by mouth at bedtime.   MOEXIPRIL (UNIVASC) 15 MG TABLET    Take 22.5 mg by mouth every evening.    OMEPRAZOLE (PRILOSEC) 20 MG CAPSULE    Take 20 mg by mouth daily with lunch.    POLYETHYLENE GLYCOL (MIRALAX / GLYCOLAX) PACKET    Take 17 g by mouth every evening.  SIMETHICONE (MYLICON) 80 MG CHEWABLE TABLET    Chew 160 mg by mouth 2 (two) times daily.   TRAMADOL (ULTRAM) 50 MG TABLET    Take 1 tablet (50 mg total) by mouth every 6 (six) hours as needed for moderate pain.   VITAMIN B-12 (CYANOCOBALAMIN) 1000 MCG TABLET    Take 1,000 mcg by mouth daily.    ZOLPIDEM (AMBIEN) 5 MG TABLET    Take one tablet by mouth once daily at bedtime for rest (control)  Modified Medications   No medications on file  Discontinued Medications   No medications on file     Physical Exam:  Filed Vitals:   03/18/15 1642  BP: 122/66  Pulse: 68  Temp: 98.1 F (36.7 C)  Resp: 18  Weight: 92 lb  (41.731 kg)   Wt Readings from Last 3 Encounters:  03/18/15 92 lb (41.731 kg)  02/15/15 94 lb (42.638 kg)  01/07/15 94 lb 3.2 oz (42.729 kg)   Physical Exam  Constitutional: No distress.  Frail  HENT:  Head: Normocephalic and atraumatic.  Neck: No JVD present. No tracheal deviation present.  Cardiovascular: Normal rate, regular rhythm, normal heart sounds and intact distal pulses.   No murmur heard. Pulmonary/Chest: Effort normal and breath sounds normal. No respiratory distress. She has no wheezes. She has no rales.  Abdominal: Soft. Bowel sounds are normal. She exhibits no distension. There is no tenderness.  Musculoskeletal: She exhibits no edema or tenderness.  Left mid foot with bony prominence on the dorsum of the foot as well as another bony prominence medially. Tenderness noted on exam, no edema or erythema  Lymphadenopathy:    She has no cervical adenopathy.  Neurological: She is alert. No cranial nerve deficit.  Oriented x2  Skin: Skin is warm and dry. No rash noted. She is not diaphoretic. No erythema.  Psychiatric: Affect normal.    Labs reviewed/Significant Diagnostic Results:   Basic Metabolic Panel:  Recent Labs  08/10/14 0535 08/11/14 0445 08/27/14 1022 09/08/14 11/23/14  NA 137 138 133* 136* 136*  K 3.9 4.0 3.7 4.3 4.6  CL 103 102 99  --   --   CO2 23 24 24   --   --   GLUCOSE 96 89 117*  --   --   BUN 14 15 10 12 16   CREATININE 0.64 0.66 0.9 0.6 0.7  CALCIUM 9.2 9.3 9.4  --   --    Liver Function Tests:  Recent Labs  08/06/14 1817 08/07/14 0354 08/07/14 0958 08/27/14 1022  AST 14 14  --  20  ALT 12 11  --  11  ALKPHOS 163* 143*  --  125*  BILITOT 0.3 0.3  --  0.3  PROT 6.5 5.7* 5.5* 6.6  ALBUMIN 2.2* 1.9*  --  2.7*   No results for input(s): LIPASE, AMYLASE in the last 8760 hours. No results for input(s): AMMONIA in the last 8760 hours. CBC:  Recent Labs  08/06/14 1817 08/07/14 0354 08/11/14 0445 08/12/14 0433 08/27/14 1022  10/08/14  WBC 17.4* 14.0* 16.6* 16.8* 14.4* 9.0  NEUTROABS 14.4* 10.8*  --   --  11.1*  --   HGB 13.3 11.3* 12.6 12.6 12.6 12.3  HCT 38.8 33.3* 38.1 38.9 38.8 39  MCV 89.6 88.6 90.5 92.2 91.3  --   PLT 482* 532* 671* 642* 740.0* 443*   Lab Results  Component Value Date   TSH 4.26 11/17/2014      Assessment/Plan  1. Essential hypertension Stable  on current meds without edema. Monitor BMP q 6 months  2. Bloating Stable, continue simethicone  3. GERD Stable with hx of GIB, continue current med  4. Constipation Stable, continue Miralax  5. Loss of weight -continue remeron for one more month, if no improvement would d/c -given her age and frailty would avoid work up or other meds  -continue nutritional support  6. Foot pain -continue ultram prn for pain -obtain foot xray    Labs at next visit   Cindi Carbon, [place] [phone]

## 2015-04-02 LAB — BASIC METABOLIC PANEL
BUN: 19 mg/dL (ref 4–21)
Creatinine: 0.6 mg/dL (ref 0.5–1.1)
Glucose: 84 mg/dL
Potassium: 3.7 mmol/L (ref 3.4–5.3)
Sodium: 136 mmol/L — AB (ref 137–147)

## 2015-04-13 DIAGNOSIS — R54 Age-related physical debility: Secondary | ICD-10-CM | POA: Diagnosis not present

## 2015-04-22 ENCOUNTER — Encounter: Payer: Self-pay | Admitting: Adult Health

## 2015-04-22 ENCOUNTER — Non-Acute Institutional Stay (SKILLED_NURSING_FACILITY): Payer: Medicare Other | Admitting: Adult Health

## 2015-04-22 DIAGNOSIS — R634 Abnormal weight loss: Secondary | ICD-10-CM

## 2015-04-22 DIAGNOSIS — R54 Age-related physical debility: Secondary | ICD-10-CM | POA: Diagnosis not present

## 2015-04-22 DIAGNOSIS — I10 Essential (primary) hypertension: Secondary | ICD-10-CM | POA: Diagnosis not present

## 2015-04-22 DIAGNOSIS — M19011 Primary osteoarthritis, right shoulder: Secondary | ICD-10-CM

## 2015-04-22 DIAGNOSIS — R140 Abdominal distension (gaseous): Secondary | ICD-10-CM

## 2015-04-22 DIAGNOSIS — K5901 Slow transit constipation: Secondary | ICD-10-CM

## 2015-04-22 NOTE — Progress Notes (Signed)
Patient ID: [name], female   DOB: August 25, 1917, 81 y.o.   MRN: [id number]    Code status DNR   Nursing Home Location:  [place] of Service: SNF (31)  Chief Complaint  Patient presents with  . Acute Visit    htn, bloating, gas    HPI:  81 y.o. female residing at [facility], skilled care section. I am her to review her chronic diseases. She has a hx of memory loss, debility, right pleural effusion, htn, protein calorie malnutrition, htn, OA, constipation, GIB, and GERD. She is currently on Remeron for weight loss and has remained at 94 lbs. She has a poor appetite overall.  She has issues with bloating and gas and is currently on simethicone. She had four BMs today per resident.  She has difficult to control BP. There is no hx of CHF, she has no edema, SOB, CP, or DOE. Her BP has been 192/104 despite adding HCTZ and Lisinopril. She was previously on moxepril which was d/c'd since it was not working. She was also previously on norvasc but this was discontinued last year due to a pleural effusion with low BP and tachycardia.    There is no echo for review. Her last EKG in 07/2014 showed, ST, PACs, LAFB, old anterior MI, with ST elevation.   Functional status: most sits in the recline, can walk with assistance and a walker   Review of Systems:  Review of Systems  Constitutional: Negative for fever, chills, diaphoresis, activity change, appetite change, fatigue and unexpected weight change.  HENT: Negative for congestion, postnasal drip, rhinorrhea and trouble swallowing.        HOH  Eyes: Negative for visual disturbance.       Poor vision  Respiratory: Negative for apnea, cough, shortness of breath and wheezing.   Cardiovascular: Negative for chest pain, palpitations and leg swelling.  Gastrointestinal: Positive for diarrhea. Negative for nausea, vomiting, constipation, abdominal distention and rectal pain.       Bloating and  excessive gas  Genitourinary: Negative for dysuria, flank pain and difficulty urinating.  Musculoskeletal: Positive for arthralgias and gait problem. Negative for back pain and joint swelling.       Chronic right shoulder pain.   Skin: Negative for color change, rash and wound.  Neurological: Negative for dizziness, seizures, speech difficulty and numbness.  Psychiatric/Behavioral: Negative for confusion and agitation.    Medications: Patient's Medications  New Prescriptions   No medications on file  Previous Medications   ACETAMINOPHEN (TYLENOL) 500 MG TABLET    Take 1,000 mg by mouth 2 (two) times daily.    CALCIUM CARB-CHOLECALCIFEROL (CALCIUM 600 + D PO)    Take 1 tablet by mouth 2 (two) times daily.   CETIRIZINE (ZYRTEC) 10 MG TABLET    Take 10 mg by mouth every morning.    FEEDING SUPPLEMENT, ENSURE COMPLETE, (ENSURE COMPLETE) LIQD    Take 237 mLs by mouth 3 (three) times daily between meals.   FLUTICASONE (VERAMYST) 27.5 MCG/SPRAY NASAL SPRAY    Place 1 spray into the nose daily.   HYDROCORTISONE (ANUSOL-HC) 2.5 % RECTAL CREAM    Place 1 application rectally as needed for hemorrhoids or itching.   MIRTAZAPINE (REMERON) 15 MG TABLET    Take 15 mg by mouth at bedtime.   MOEXIPRIL (UNIVASC) 15 MG TABLET    Take 22.5 mg by mouth every evening.    OMEPRAZOLE (PRILOSEC) 20 MG CAPSULE    Take 20  mg by mouth daily with lunch.    POLYETHYLENE GLYCOL (MIRALAX / GLYCOLAX) PACKET    Take 17 g by mouth every evening.    SIMETHICONE (MYLICON) 80 MG CHEWABLE TABLET    Chew 160 mg by mouth 2 (two) times daily.   TRAMADOL (ULTRAM) 50 MG TABLET    Take 1 tablet (50 mg total) by mouth every 6 (six) hours as needed for moderate pain.   VITAMIN B-12 (CYANOCOBALAMIN) 1000 MCG TABLET    Take 1,000 mcg by mouth daily.    ZOLPIDEM (AMBIEN) 5 MG TABLET    Take one tablet by mouth once daily at bedtime for rest (control)  Modified Medications   No medications on file  Discontinued Medications   No  medications on file     Physical Exam:  Filed Vitals:   04/22/15 1453  BP: 192/104  Pulse: 96  Resp: 20  Weight: 94 lb 6.4 oz (42.82 kg)  SpO2: 98%   Wt Readings from Last 3 Encounters:  04/22/15 94 lb 6.4 oz (42.82 kg)  03/18/15 92 lb (41.731 kg)  02/15/15 94 lb (42.638 kg)   Physical Exam  Constitutional: No distress.  Frail  HENT:  Head: Normocephalic and atraumatic.  Neck: No JVD present. No tracheal deviation present.  Cardiovascular: Normal rate, regular rhythm, normal heart sounds and intact distal pulses.   No murmur heard. Pulmonary/Chest: Effort normal and breath sounds normal. No respiratory distress. She has no wheezes. She has no rales.  Abdominal: Soft. Bowel sounds are normal. She exhibits no distension. There is no tenderness.  Musculoskeletal: She exhibits no edema or tenderness.  Lymphadenopathy:    She has no cervical adenopathy.  Neurological: She is alert. No cranial nerve deficit.  Oriented x2  Skin: Skin is warm and dry. No rash noted. She is not diaphoretic. No erythema.  Psychiatric: Affect normal.    Labs reviewed/Significant Diagnostic Results:   Basic Metabolic Panel:  Recent Labs  08/10/14 0535 08/11/14 0445 08/27/14 1022 09/08/14 11/23/14 04/02/15  NA 137 138 133* 136* 136* 136*  K 3.9 4.0 3.7 4.3 4.6 3.7  CL 103 102 99  --   --   --   CO2 23 24 24   --   --   --   GLUCOSE 96 89 117*  --   --   --   BUN 14 15 10 12 16 19   CREATININE 0.64 0.66 0.9 0.6 0.7 0.6  CALCIUM 9.2 9.3 9.4  --   --   --    Liver Function Tests:  Recent Labs  08/06/14 1817 08/07/14 0354 08/07/14 0958 08/27/14 1022  AST 14 14  --  20  ALT 12 11  --  11  ALKPHOS 163* 143*  --  125*  BILITOT 0.3 0.3  --  0.3  PROT 6.5 5.7* 5.5* 6.6  ALBUMIN 2.2* 1.9*  --  2.7*   No results for input(s): LIPASE, AMYLASE in the last 8760 hours. No results for input(s): AMMONIA in the last 8760 hours. CBC:  Recent Labs  08/06/14 1817 08/07/14 0354  08/11/14 0445 08/12/14 0433 08/27/14 1022 10/08/14  WBC 17.4* 14.0* 16.6* 16.8* 14.4* 9.0  NEUTROABS 14.4* 10.8*  --   --  11.1*  --   HGB 13.3 11.3* 12.6 12.6 12.6 12.3  HCT 38.8 33.3* 38.1 38.9 38.8 39  MCV 89.6 88.6 90.5 92.2 91.3  --   PLT 482* 532* 671* 642* 740.0* 443*   Lab Results  Component Value Date  TSH 4.26 11/17/2014      Assessment/Plan  1. Accelerated hypertension -severely uncontrolled  -increase lisinopril to 20 mg and add Cardizem LA 120 mg daily -recheck BMP in 1 week -assistance when OOB at all times -goal BP can be relaxed given her age, debility, and risk of falls  2. Bloating -d/c miralax  3. Slow transit constipation -see #2, add colace 100 mg BID  4. Loss of weight -no improvement, if this continues will d/c remeron  5. Primary osteoarthritis of right shoulder -continue prn ultram   I called and left a message with Enid Derry, regarding her care.   Cindi Carbon, ANP [facility] [phone]

## 2015-04-27 ENCOUNTER — Encounter: Payer: Self-pay | Admitting: Internal Medicine

## 2015-04-27 ENCOUNTER — Non-Acute Institutional Stay (SKILLED_NURSING_FACILITY): Payer: Medicare Other | Admitting: Internal Medicine

## 2015-04-27 DIAGNOSIS — K5901 Slow transit constipation: Secondary | ICD-10-CM

## 2015-04-27 NOTE — Progress Notes (Signed)
Patient ID: [name], female   DOB: May 14, 1917, 81 y.o.   MRN: [id number]  Location:  [facility] Provider:  Rexene Edison. Mariea Clonts, D.O., C.M.D.  Code Status:  DNR Goals of care: Advanced Directive information Does patient have an advance directive?: Yes, Type of Advance Directive: [place];Out of facility DNR (pink MOST or yellow form), Pre-existing out of facility DNR order (yellow form or pink MOST form): Yellow form placed in chart (order not valid for inpatient use)  Chief Complaint  Patient presents with  . Acute Visit    constipation    HPI:  81 yo white female long term SNF resident seen for med mgt of chronic diseases.  She has now been having difficulty with constipation since miralax discontinued and had a bout severe enough to require disimpaction last night.  She had been having "diarrhea" when NP saw her which is why it got discontinued.  Staff believe pt gave a poor history and that was not truly what was going on (has dementia).  She is feeling better today and back to regular bms.  Review of Systems:  Review of Systems  Constitutional: Positive for malaise/fatigue. Negative for fever and chills.  Gastrointestinal: Positive for constipation. Negative for abdominal pain, diarrhea, blood in stool and melena.  Musculoskeletal: Negative for falls.  Neurological: Negative for weakness.  Psychiatric/Behavioral: Positive for memory loss.    Past Medical History  Diagnosis Date  . Arthritis   . Carpal tunnel syndrome on left   . DJD (degenerative joint disease)   . Diverticulosis   . Macular degeneration syndrome   . GERD (gastroesophageal reflux disease)   . Headache(784.0)   . Wears glasses   . Wears hearing aid     both  . Wears partial dentures     top and bottom  . High blood pressure     echo 09-normal  . Lower GI bleed 06/2013    required 2 units PRBC  . Pleural effusion 08/07/2014  . Essential hypertension 06/29/2013    D/c 'd  amlodipine  08/27/2014 due to low bp and relative tachycardia      Patient Active Problem List   Diagnosis Date Noted  . Insomnia 01/07/2015  . Loss of weight 12/10/2014  . Osteoarthritis, shoulder 12/10/2014  . Bloating 10/06/2014  . Lumbar back pain 09/08/2014  . Dyspnea 08/27/2014  . Protein-calorie malnutrition, severe 08/11/2014  . Loculated pleural effusion on Right   . Lung nodule 08/06/2014  . DJD (degenerative joint disease)   . Macular degeneration syndrome   . HCAP (healthcare-associated pneumonia) 08/04/2014  . Debility 07/07/2013  . GI bleed 06/29/2013  . Essential hypertension 06/29/2013  . Dementia 06/29/2013  . GERD (gastroesophageal reflux disease) 06/29/2013  . B12 deficiency 06/29/2013  . Syncope 06/29/2013  . Arrhythmia 06/29/2013  . Macular degeneration 06/29/2013    Allergies  Allergen Reactions  . Celebrex [Celecoxib] Itching  . Morphine And Related Nausea Only  . Vicodin [Hydrocodone-Acetaminophen] Itching  . Sulfa Antibiotics Rash    Medications: Patient's Medications  New Prescriptions   No medications on file  Previous Medications   ACETAMINOPHEN (TYLENOL) 500 MG TABLET    Take 1,000 mg by mouth 2 (two) times daily.    CALCIUM CARB-CHOLECALCIFEROL (CALCIUM 600 + D PO)    Take 1 tablet by mouth 2 (two) times daily.   CETIRIZINE (ZYRTEC) 10 MG TABLET    Take 10 mg by mouth every morning.    FEEDING SUPPLEMENT, ENSURE  COMPLETE, (ENSURE COMPLETE) LIQD    Take 237 mLs by mouth 3 (three) times daily between meals.   FLUTICASONE (VERAMYST) 27.5 MCG/SPRAY NASAL SPRAY    Place 1 spray into the nose daily.   HYDROCORTISONE (ANUSOL-HC) 2.5 % RECTAL CREAM    Place 1 application rectally as needed for hemorrhoids or itching.   MIRTAZAPINE (REMERON) 15 MG TABLET    Take 15 mg by mouth at bedtime.   MOEXIPRIL (UNIVASC) 15 MG TABLET    Take 22.5 mg by mouth every evening.    OMEPRAZOLE (PRILOSEC) 20 MG CAPSULE    Take 20 mg by mouth daily with lunch.     POLYETHYLENE GLYCOL (MIRALAX / GLYCOLAX) PACKET    Take 17 g by mouth every evening.    SIMETHICONE (MYLICON) 80 MG CHEWABLE TABLET    Chew 160 mg by mouth 2 (two) times daily.   TRAMADOL (ULTRAM) 50 MG TABLET    Take 1 tablet (50 mg total) by mouth every 6 (six) hours as needed for moderate pain.   VITAMIN B-12 (CYANOCOBALAMIN) 1000 MCG TABLET    Take 1,000 mcg by mouth daily.    ZOLPIDEM (AMBIEN) 5 MG TABLET    Take one tablet by mouth once daily at bedtime for rest (control)  Modified Medications   No medications on file  Discontinued Medications   No medications on file    Physical Exam: Filed Vitals:   04/27/15 1214  BP: 132/72  Pulse: 85  Temp: 98.6 F (37 C)  Resp: 16  Weight: 94 lb 6.4 oz (42.82 kg)  SpO2: 97%   Body mass index is 16.73 kg/(m^2).  Physical Exam  Constitutional: She appears well-developed and well-nourished. No distress.  Cardiovascular: Normal rate and regular rhythm.   Pulmonary/Chest: Breath sounds normal.  Abdominal: Soft. Bowel sounds are normal. She exhibits no distension and no mass. There is no tenderness. There is no rebound and no guarding.  Neurological: She is alert.  Skin: Skin is warm and dry.  Psychiatric:  Pleasantly confused--comes across clear, but staff note her story is not consistent    Labs reviewed: Basic Metabolic Panel:  Recent Labs  08/10/14 0535 08/11/14 0445 08/27/14 1022 09/08/14 11/23/14 04/02/15  NA 137 138 133* 136* 136* 136*  K 3.9 4.0 3.7 4.3 4.6 3.7  CL 103 102 99  --   --   --   CO2 23 24 24   --   --   --   GLUCOSE 96 89 117*  --   --   --   BUN 14 15 10 12 16 19   CREATININE 0.64 0.66 0.9 0.6 0.7 0.6  CALCIUM 9.2 9.3 9.4  --   --   --     Liver Function Tests:  Recent Labs  08/06/14 1817 08/07/14 0354 08/07/14 0958 08/27/14 1022  AST 14 14  --  20  ALT 12 11  --  11  ALKPHOS 163* 143*  --  125*  BILITOT 0.3 0.3  --  0.3  PROT 6.5 5.7* 5.5* 6.6  ALBUMIN 2.2* 1.9*  --  2.7*    CBC:  Recent  Labs  08/06/14 1817 08/07/14 0354 08/11/14 0445 08/12/14 0433 08/27/14 1022 10/08/14  WBC 17.4* 14.0* 16.6* 16.8* 14.4* 9.0  NEUTROABS 14.4* 10.8*  --   --  11.1*  --   HGB 13.3 11.3* 12.6 12.6 12.6 12.3  HCT 38.8 33.3* 38.1 38.9 38.8 39  MCV 89.6 88.6 90.5 92.2 91.3  --  PLT 482* 532* 671* 642* 740.0* 443*    Lab Results  Component Value Date   TSH 4.26 11/17/2014   No results found for: HGBA1C No results found for: CHOL, HDL, LDLCALC, LDLDIRECT, TRIG, CHOLHDL  Patient Care Team: Gayland Curry, DO as PCP - General (Geriatric Medicine) [facility]  Assessment/Plan 1. Slow transit constipation -resume miralax every other day, cont simethicone, anusol supps as needed for hemorrhoidal discomfort -encourage po fluids and some more activity--walking with walker with assistance (she sits in her recliner a lot)  Family/ staff Communication: discussed with nursing  Labs/tests ordered:  No new  Sania Noy L. Charice Zuno, D.O. [facility] [address], [place] [address] Cell Phone (Mon-Fri 8am-5pm):  [phone] On Call:  [phone] & follow prompts after 5pm & weekends Office Phone:  [phone] Office Fax:  [phone]

## 2015-04-29 DIAGNOSIS — I151 Hypertension secondary to other renal disorders: Secondary | ICD-10-CM | POA: Diagnosis not present

## 2015-05-13 DIAGNOSIS — I151 Hypertension secondary to other renal disorders: Secondary | ICD-10-CM | POA: Diagnosis not present

## 2015-05-13 LAB — BASIC METABOLIC PANEL
BUN: 13 mg/dL (ref 4–21)
CREATININE: 0.6 mg/dL (ref 0.5–1.1)
Glucose: 77 mg/dL
Potassium: 3.4 mmol/L (ref 3.4–5.3)
SODIUM: 134 mmol/L — AB (ref 137–147)

## 2015-05-27 DIAGNOSIS — I151 Hypertension secondary to other renal disorders: Secondary | ICD-10-CM | POA: Diagnosis not present

## 2015-05-28 ENCOUNTER — Other Ambulatory Visit: Payer: Self-pay | Admitting: *Deleted

## 2015-05-28 MED ORDER — ZOLPIDEM TARTRATE 5 MG PO TABS: ORAL_TABLET | ORAL | Status: DC

## 2015-05-28 NOTE — Telephone Encounter (Signed)
[facility] 

## 2015-05-31 ENCOUNTER — Non-Acute Institutional Stay (SKILLED_NURSING_FACILITY): Payer: Medicare Other | Admitting: Adult Health

## 2015-05-31 DIAGNOSIS — K5901 Slow transit constipation: Secondary | ICD-10-CM

## 2015-05-31 DIAGNOSIS — G4700 Insomnia, unspecified: Secondary | ICD-10-CM

## 2015-05-31 DIAGNOSIS — R634 Abnormal weight loss: Secondary | ICD-10-CM

## 2015-05-31 DIAGNOSIS — F0390 Unspecified dementia without behavioral disturbance: Secondary | ICD-10-CM | POA: Diagnosis not present

## 2015-06-02 DIAGNOSIS — Z23 Encounter for immunization: Secondary | ICD-10-CM | POA: Diagnosis not present

## 2015-06-07 ENCOUNTER — Encounter: Payer: Self-pay | Admitting: Adult Health

## 2015-06-07 DIAGNOSIS — K5900 Constipation, unspecified: Secondary | ICD-10-CM | POA: Insufficient documentation

## 2015-06-07 NOTE — Progress Notes (Signed)
Patient ID: [name], female   DOB: 1917/07/08, 81 y.o.   MRN: [id number]    Code status DNR   Nursing Home Location:  [place] of Service: SNF (31)  Chief Complaint  Patient presents with  . Medical Management of Chronic Issues    HPI:  81 y.o. female residing at [facility], skilled care section. I am her to review her chronic diseases. She has a hx of memory loss, debility, right pleural effusion, htn, protein calorie malnutrition, htn, OA, constipation, GIB, and GERD. She is currently on Remeron for weight loss and her weight has continued to decline at 91 lbs today. She reports having no appetite, denies depression. She denies issues with chewing her food. Endorses gas frequently.   Her BP has improved, ranging 152-176/89-96. No SOB, CP, palpitations.    Functional status: most sits in the recliner, can walk with assistance and a walker   Review of Systems:  Review of Systems  Constitutional: Negative for fever, chills, diaphoresis, activity change, appetite change, fatigue and unexpected weight change.  HENT: Negative for congestion, postnasal drip, rhinorrhea and trouble swallowing.        HOH  Eyes: Negative for visual disturbance.       Poor vision  Respiratory: Negative for apnea, cough, shortness of breath and wheezing.   Cardiovascular: Negative for chest pain, palpitations and leg swelling.  Gastrointestinal: Positive for diarrhea. Negative for nausea, vomiting, constipation, abdominal distention and rectal pain.       Bloating and excessive gas  Genitourinary: Negative for dysuria, flank pain and difficulty urinating.  Musculoskeletal: Positive for arthralgias and gait problem. Negative for back pain and joint swelling.       Chronic right shoulder pain.   Skin: Negative for color change, rash and wound.  Neurological: Negative for dizziness, seizures, speech difficulty and numbness.  Psychiatric/Behavioral:  Negative for confusion and agitation.    Medications: Patient's Medications  New Prescriptions   No medications on file  Previous Medications   ACETAMINOPHEN (TYLENOL) 500 MG TABLET    Take 1,000 mg by mouth 2 (two) times daily.    CALCIUM CARB-CHOLECALCIFEROL (CALCIUM 600 + D PO)    Take 1 tablet by mouth 2 (two) times daily.   CETIRIZINE (ZYRTEC) 10 MG TABLET    Take 10 mg by mouth every morning.    DILTIAZEM (CARDIZEM) 120 MG TABLET    Take 120 mg by mouth daily.   DOCUSATE SODIUM (COLACE) 100 MG CAPSULE    Take 100 mg by mouth 2 (two) times daily.   FEEDING SUPPLEMENT, ENSURE COMPLETE, (ENSURE COMPLETE) LIQD    Take 237 mLs by mouth 3 (three) times daily between meals.   FLUTICASONE (VERAMYST) 27.5 MCG/SPRAY NASAL SPRAY    Place 1 spray into the nose daily.   HYDROCORTISONE (ANUSOL-HC) 2.5 % RECTAL CREAM    Place 1 application rectally as needed for hemorrhoids or itching.   LISINOPRIL (PRINIVIL,ZESTRIL) 20 MG TABLET    Take 20 mg by mouth daily.   MIRTAZAPINE (REMERON) 15 MG TABLET    Take 15 mg by mouth at bedtime.   OMEPRAZOLE (PRILOSEC) 20 MG CAPSULE    Take 20 mg by mouth daily with lunch.    POLYETHYLENE GLYCOL (MIRALAX / GLYCOLAX) PACKET    Take 17 g by mouth every other day.    SIMETHICONE (MYLICON) 80 MG CHEWABLE TABLET    Chew 160 mg by mouth 2 (two) times daily.   TRAMADOL (  ULTRAM) 50 MG TABLET    Take 1 tablet (50 mg total) by mouth every 6 (six) hours as needed for moderate pain.   VITAMIN B-12 (CYANOCOBALAMIN) 1000 MCG TABLET    Take 1,000 mcg by mouth daily.    ZOLPIDEM (AMBIEN) 5 MG TABLET    Take one tablet by mouth once daily at bedtime for rest (control)  Modified Medications   No medications on file  Discontinued Medications   MOEXIPRIL (UNIVASC) 15 MG TABLET    Take 22.5 mg by mouth every evening.      Physical Exam:  Filed Vitals:   06/07/15 1238  BP: 120/67  Pulse: 87  Temp: 98 F (36.7 C)  Resp: 20  Weight: 91 lb 12.8 oz (41.64 kg)   Wt Readings  from Last 3 Encounters:  06/07/15 91 lb 12.8 oz (41.64 kg)  04/27/15 94 lb 6.4 oz (42.82 kg)  04/22/15 94 lb 6.4 oz (42.82 kg)   Physical Exam  Constitutional: No distress.  Frail  HENT:  Head: Normocephalic and atraumatic.  Neck: No JVD present. No tracheal deviation present.  Cardiovascular: Normal rate, regular rhythm, normal heart sounds and intact distal pulses.   No murmur heard. Pulmonary/Chest: Effort normal and breath sounds normal. No respiratory distress. She has no wheezes. She has no rales.  Abdominal: Soft. Bowel sounds are normal. She exhibits no distension. There is no tenderness.  Musculoskeletal: She exhibits no edema or tenderness.  Lymphadenopathy:    She has no cervical adenopathy.  Neurological: She is alert. No cranial nerve deficit.  Oriented x2  Skin: Skin is warm and dry. No rash noted. She is not diaphoretic. No erythema.  Psychiatric: Affect normal.    Labs reviewed/Significant Diagnostic Results:  EKG in 07/2014 showed, ST, PACs, LAFB, old anterior MI, with ST elevation.   Basic Metabolic Panel:  Recent Labs  08/10/14 0535 08/11/14 0445 08/27/14 1022  11/23/14 04/02/15 05/13/15  NA 137 138 133*  < > 136* 136* 134*  K 3.9 4.0 3.7  < > 4.6 3.7 3.4  CL 103 102 99  --   --   --   --   CO2 23 24 24   --   --   --   --   GLUCOSE 96 89 117*  --   --   --   --   BUN 14 15 10   < > 16 19 13   CREATININE 0.64 0.66 0.9  < > 0.7 0.6 0.6  CALCIUM 9.2 9.3 9.4  --   --   --   --   < > = values in this interval not displayed. Liver Function Tests:  Recent Labs  08/06/14 1817 08/07/14 0354 08/07/14 0958 08/27/14 1022  AST 14 14  --  20  ALT 12 11  --  11  ALKPHOS 163* 143*  --  125*  BILITOT 0.3 0.3  --  0.3  PROT 6.5 5.7* 5.5* 6.6  ALBUMIN 2.2* 1.9*  --  2.7*   No results for input(s): LIPASE, AMYLASE in the last 8760 hours. No results for input(s): AMMONIA in the last 8760 hours. CBC:  Recent Labs  08/06/14 1817 08/07/14 0354 08/11/14 0445  08/12/14 0433 08/27/14 1022 10/08/14  WBC 17.4* 14.0* 16.6* 16.8* 14.4* 9.0  NEUTROABS 14.4* 10.8*  --   --  11.1*  --   HGB 13.3 11.3* 12.6 12.6 12.6 12.3  HCT 38.8 33.3* 38.1 38.9 38.8 39  MCV 89.6 88.6 90.5 92.2 91.3  --  PLT 482* 532* 671* 642* 740.0* 443*   Lab Results  Component Value Date   TSH 4.26 11/17/2014      Assessment/Plan  1. Accelerated hypertension -improved, goal BP 150/90 due to age and debility -continue current meds, she was encourage to call for help when getting OOB  2. Slow transit constipation -improved with miralax QOD, still issues with bloating  3. Insomnia -reports occasional sleepless night,  -continue current dose of ambien, no further increase due to age and fall risk  4. Loss of weight -no improvement, continues to lose weight -consider stopping remeron at next visit.       Cindi Carbon, ANP [facility] [phone]

## 2015-07-15 ENCOUNTER — Encounter: Payer: Self-pay | Admitting: Adult Health

## 2015-07-15 ENCOUNTER — Non-Acute Institutional Stay (SKILLED_NURSING_FACILITY): Payer: Medicare Other | Admitting: Adult Health

## 2015-07-15 DIAGNOSIS — R634 Abnormal weight loss: Secondary | ICD-10-CM

## 2015-07-15 DIAGNOSIS — I10 Essential (primary) hypertension: Secondary | ICD-10-CM | POA: Diagnosis not present

## 2015-07-15 DIAGNOSIS — R5381 Other malaise: Secondary | ICD-10-CM | POA: Diagnosis not present

## 2015-07-15 DIAGNOSIS — F411 Generalized anxiety disorder: Secondary | ICD-10-CM

## 2015-07-15 DIAGNOSIS — G4700 Insomnia, unspecified: Secondary | ICD-10-CM | POA: Diagnosis not present

## 2015-07-15 DIAGNOSIS — F419 Anxiety disorder, unspecified: Secondary | ICD-10-CM | POA: Insufficient documentation

## 2015-07-15 NOTE — Progress Notes (Signed)
Patient ID: KIMERY CANTALUPO, female   DOB: 1916/12/26, 81 y.o.   MRN: [id number]    Code status DNR   Nursing Home Location:  [place] of Service: SNF (31)  Chief Complaint  Patient presents with  . Medical Management of Chronic Issues    HPI:  81 y.o. female residing at [facility], skilled care section. I am her to review her chronic diseases. She has a hx of memory loss, debility, right pleural effusion, htn, protein calorie malnutrition, OA, constipation, GIB, insomnia, and GERD. Her weight for last month was recorded at 91 lbs, for Nov she is 95.6lbs.  The resident's caretaker states that her appetite is unchanged but inadequate but they do not believe that she was as low as 91 lbs.  She remains on ensure three times a day. She likes finger foods to eat such as pizza and chicken tenders. Her caretaker reports that since starting Remeron she is more calm and pleasant.   She has insomnia and uses ambien at bedtime which helps. However, there are some nights reported where she is up frequently.   Her BP has improved at 147/83, currently taking Cardizem and lisinopril. She did not tolerate hctz due to electrolyte imbalance and lack of response.   Functional status: most sits in the recliner, can walk with assistance and a walker   Review of Systems:  Review of Systems  Constitutional: Negative for fever, chills, diaphoresis, activity change, appetite change, fatigue and unexpected weight change.  HENT: Negative for congestion, postnasal drip, rhinorrhea and trouble swallowing.        HOH  Eyes: Negative for visual disturbance.       Poor vision  Respiratory: Negative for apnea, cough, shortness of breath and wheezing.   Cardiovascular: Negative for chest pain, palpitations and leg swelling.  Gastrointestinal: Negative for nausea, vomiting, diarrhea, constipation, abdominal distention and rectal pain.       Bloating and excessive gas   Genitourinary: Negative for dysuria, flank pain and difficulty urinating.  Musculoskeletal: Positive for arthralgias and gait problem. Negative for back pain and joint swelling.       Chronic right shoulder pain.   Skin: Negative for color change, rash and wound.  Neurological: Negative for dizziness, seizures, speech difficulty and numbness.  Psychiatric/Behavioral: Positive for sleep disturbance. Negative for behavioral problems, confusion and agitation.    Medications: Patient's Medications  New Prescriptions   No medications on file  Previous Medications   ACETAMINOPHEN (TYLENOL) 500 MG TABLET    Take 1,000 mg by mouth 2 (two) times daily.    CALCIUM CARB-CHOLECALCIFEROL (CALCIUM 600 + D PO)    Take 1 tablet by mouth 2 (two) times daily.   CETIRIZINE (ZYRTEC) 10 MG TABLET    Take 10 mg by mouth every morning.    DILTIAZEM (CARDIZEM) 120 MG TABLET    Take 120 mg by mouth daily.   DOCUSATE SODIUM (COLACE) 100 MG CAPSULE    Take 100 mg by mouth 2 (two) times daily.   FEEDING SUPPLEMENT, ENSURE COMPLETE, (ENSURE COMPLETE) LIQD    Take 237 mLs by mouth 3 (three) times daily between meals.   FLUTICASONE (VERAMYST) 27.5 MCG/SPRAY NASAL SPRAY    Place 1 spray into the nose daily.   HYDROCORTISONE (ANUSOL-HC) 2.5 % RECTAL CREAM    Place 1 application rectally as needed for hemorrhoids or itching.   LISINOPRIL (PRINIVIL,ZESTRIL) 20 MG TABLET    Take 20 mg by mouth daily.  MIRTAZAPINE (REMERON) 15 MG TABLET    Take 15 mg by mouth at bedtime.   OMEPRAZOLE (PRILOSEC) 20 MG CAPSULE    Take 20 mg by mouth daily with lunch.    POLYETHYLENE GLYCOL (MIRALAX / GLYCOLAX) PACKET    Take 17 g by mouth every other day.    SIMETHICONE (MYLICON) 80 MG CHEWABLE TABLET    Chew 160 mg by mouth 2 (two) times daily.   TRAMADOL (ULTRAM) 50 MG TABLET    Take 1 tablet (50 mg total) by mouth every 6 (six) hours as needed for moderate pain.   VITAMIN B-12 (CYANOCOBALAMIN) 1000 MCG TABLET    Take 1,000 mcg by mouth  daily.    ZOLPIDEM (AMBIEN) 5 MG TABLET    Take one tablet by mouth once daily at bedtime for rest (control)  Modified Medications   No medications on file  Discontinued Medications   No medications on file     Physical Exam:  Filed Vitals:   07/15/15 1109  Weight: 95 lb 9.6 oz (43.364 kg)   Wt Readings from Last 3 Encounters:  07/15/15 95 lb 9.6 oz (43.364 kg)  06/07/15 91 lb 12.8 oz (41.64 kg)  04/27/15 94 lb 6.4 oz (42.82 kg)   Physical Exam  Constitutional: No distress.  Frail  HENT:  Head: Normocephalic and atraumatic.  Neck: No JVD present. No tracheal deviation present.  Cardiovascular: Normal rate, regular rhythm, normal heart sounds and intact distal pulses.   No murmur heard. Pulmonary/Chest: Effort normal and breath sounds normal. No respiratory distress. She has no wheezes. She has no rales.  Abdominal: Soft. Bowel sounds are normal. She exhibits no distension. There is no tenderness.  Musculoskeletal: She exhibits no edema or tenderness.  Lymphadenopathy:    She has no cervical adenopathy.  Neurological: She is alert. No cranial nerve deficit.  Oriented x2  Skin: Skin is warm and dry. No rash noted. She is not diaphoretic. No erythema.  Psychiatric: Affect normal.    Labs reviewed/Significant Diagnostic Results:  EKG in 07/2014 showed, ST, PACs, LAFB, old anterior MI, with ST elevation.   Basic Metabolic Panel:  Recent Labs  08/10/14 0535 08/11/14 0445 08/27/14 1022  11/23/14 04/02/15 05/13/15  NA 137 138 133*  < > 136* 136* 134*  K 3.9 4.0 3.7  < > 4.6 3.7 3.4  CL 103 102 99  --   --   --   --   CO2 23 24 24   --   --   --   --   GLUCOSE 96 89 117*  --   --   --   --   BUN 14 15 10   < > 16 19 13   CREATININE 0.64 0.66 0.9  < > 0.7 0.6 0.6  CALCIUM 9.2 9.3 9.4  --   --   --   --   < > = values in this interval not displayed. Liver Function Tests:  Recent Labs  08/06/14 1817 08/07/14 0354 08/07/14 0958 08/27/14 1022  AST 14 14  --  20  ALT  12 11  --  11  ALKPHOS 163* 143*  --  125*  BILITOT 0.3 0.3  --  0.3  PROT 6.5 5.7* 5.5* 6.6  ALBUMIN 2.2* 1.9*  --  2.7*   No results for input(s): LIPASE, AMYLASE in the last 8760 hours. No results for input(s): AMMONIA in the last 8760 hours. CBC:  Recent Labs  08/06/14 1817 08/07/14 0354 08/11/14 0445 08/12/14 [id number]  08/27/14 1022 10/08/14  WBC 17.4* 14.0* 16.6* 16.8* 14.4* 9.0  NEUTROABS 14.4* 10.8*  --   --  11.1*  --   HGB 13.3 11.3* 12.6 12.6 12.6 12.3  HCT 38.8 33.3* 38.1 38.9 38.8 39  MCV 89.6 88.6 90.5 92.2 91.3  --   PLT 482* 532* 671* 642* 740.0* 443*   Lab Results  Component Value Date   TSH 4.26 11/17/2014      Assessment/Plan  1. Insomnia -continues to report wakefulness 2-3 nights a week -continue ambien and add scheduled melatonin  2. Loss of weight -continue to monitor weight and provide ensure -give resident more finger foods for easier intake -will not d/c remeron per POA request, see below  3. Generalized anxiety disorder -has periods of anxiety about her routine and loss of family members -improved mood per POA so will continue remeron for now  4. Debility -walking program each afternoon from 2-4 pm with assistance -toilet q 2 hrs WA  5. Essential hypertension -improved, continue lisinopril and cardizem -BMP q 6 months     Cindi Carbon, [place] [phone]

## 2015-08-10 ENCOUNTER — Non-Acute Institutional Stay (SKILLED_NURSING_FACILITY): Payer: Medicare Other | Admitting: Internal Medicine

## 2015-08-10 ENCOUNTER — Encounter: Payer: Self-pay | Admitting: Internal Medicine

## 2015-08-10 DIAGNOSIS — F0390 Unspecified dementia without behavioral disturbance: Secondary | ICD-10-CM

## 2015-08-10 DIAGNOSIS — Z789 Other specified health status: Secondary | ICD-10-CM

## 2015-08-10 DIAGNOSIS — J209 Acute bronchitis, unspecified: Secondary | ICD-10-CM | POA: Diagnosis not present

## 2015-08-10 DIAGNOSIS — R5381 Other malaise: Secondary | ICD-10-CM | POA: Diagnosis not present

## 2015-08-10 DIAGNOSIS — E43 Unspecified severe protein-calorie malnutrition: Secondary | ICD-10-CM

## 2015-08-10 DIAGNOSIS — R05 Cough: Secondary | ICD-10-CM | POA: Diagnosis not present

## 2015-08-11 DIAGNOSIS — I151 Hypertension secondary to other renal disorders: Secondary | ICD-10-CM | POA: Diagnosis not present

## 2015-08-11 LAB — BASIC METABOLIC PANEL
BUN: 17 mg/dL (ref 4–21)
Creatinine: 0.7 mg/dL (ref 0.5–1.1)
GLUCOSE: 85 mg/dL
POTASSIUM: 4.1 mmol/L (ref 3.4–5.3)
SODIUM: 137 mmol/L (ref 137–147)

## 2015-08-11 LAB — CBC AND DIFFERENTIAL
HEMATOCRIT: 41 % (ref 36–46)
HEMOGLOBIN: 13.5 g/dL (ref 12.0–16.0)
Platelets: 303 10*3/uL (ref 150–399)
WBC: 6 10*3/mL

## 2015-08-12 ENCOUNTER — Non-Acute Institutional Stay (SKILLED_NURSING_FACILITY): Payer: Medicare Other | Admitting: Adult Health

## 2015-08-12 ENCOUNTER — Encounter: Payer: Self-pay | Admitting: Adult Health

## 2015-08-12 DIAGNOSIS — I10 Essential (primary) hypertension: Secondary | ICD-10-CM | POA: Diagnosis not present

## 2015-08-12 DIAGNOSIS — N393 Stress incontinence (female) (male): Secondary | ICD-10-CM

## 2015-08-12 DIAGNOSIS — G4700 Insomnia, unspecified: Secondary | ICD-10-CM

## 2015-08-12 DIAGNOSIS — M19011 Primary osteoarthritis, right shoulder: Secondary | ICD-10-CM

## 2015-08-12 DIAGNOSIS — R32 Unspecified urinary incontinence: Secondary | ICD-10-CM | POA: Insufficient documentation

## 2015-08-12 NOTE — Progress Notes (Signed)
Patient ID: [name], female   DOB: [birth year], 81 y.o.   MRN: [id number]    Code status DNR  Patient Care Team: Gayland Curry, DO as PCP - General (Geriatric Medicine) [facility] Location:  [place] of Service: SNF [phone])  Chief Complaint  Patient presents with  . Medical Management of Chronic Issues    HPI:  81 y.o. female residing at [facility], skilled care section. I am her to review her chronic diseases. She has a hx of memory loss, debility, right pleural effusion, htn, protein calorie malnutrition, htn, OA, constipation, GIB, and GERD. She is currently on Remeron for weight loss and depression. Her weight has increased by 3 lbs to 94 lbs from last month.    She was seen by Dr. Mariea Clonts on 12/13 for a cough. Subsequent CXR and labs were unremarkable. The resident and caregiver feel that her cough is improving. She denies any SOB and is eating and drinking. Melatonin was added to her night time regimen for sleep and she reports that it helped her sleep better.  She had an incontinent episode due to not being able to get up to the BR on time.  She is on a q 2 hr toileting regimen. BP better controlled without reports of CP, SOB, or edema. She continues to walk with a walker and one person assistance. She has chronic right shoulder pain but has not used any prn ultram in the past two weeks, remains on scheduled tylenol.  Functional status: most sits in the recliner, can walk with assistance and a walker   Review of Systems:  Review of Systems  Constitutional: Negative for fever, chills, diaphoresis, activity change, appetite change, fatigue and unexpected weight change.  HENT: Positive for congestion. Negative for postnasal drip, rhinorrhea and trouble swallowing.        HOH  Eyes: Negative for visual disturbance.       Poor vision  Respiratory: Positive for cough. Negative for apnea,  shortness of breath and wheezing.   Cardiovascular: Negative for chest pain, palpitations and leg swelling.  Gastrointestinal: Negative for nausea, vomiting, diarrhea, constipation, abdominal distention and rectal pain.       Bloating and excessive gas  Genitourinary: Negative for dysuria, flank pain and difficulty urinating.  Musculoskeletal: Positive for arthralgias and gait problem. Negative for back pain and joint swelling.       Chronic right shoulder pain.   Skin: Negative for color change, rash and wound.  Neurological: Negative for dizziness, seizures, speech difficulty and numbness.  Psychiatric/Behavioral: Negative for confusion and agitation.    Medications: Patient's Medications  New Prescriptions   No medications on file  Previous Medications   ACETAMINOPHEN (TYLENOL) 500 MG TABLET    Take 1,000 mg by mouth 2 (two) times daily.    CALCIUM CARB-CHOLECALCIFEROL (CALCIUM 600 + D PO)    Take 1 tablet by mouth 2 (two) times daily.   CETIRIZINE (ZYRTEC) 10 MG TABLET    Take 10 mg by mouth every morning.    CHLORPHENIRAMINE-DM (CORICIDIN HBP COUGH/COLD PO)    Take by mouth. 2 teaspoon every 6 hours as needed for cough for 10 days, started 08/10/15   DILTIAZEM (CARDIZEM) 120 MG TABLET    Take 120 mg by mouth daily.   DOCUSATE SODIUM (COLACE) 100 MG CAPSULE    Take 100 mg by mouth 2 (two) times daily.   FEEDING SUPPLEMENT, ENSURE COMPLETE, (ENSURE  COMPLETE) LIQD    Take 237 mLs by mouth 3 (three) times daily between meals.   FLUTICASONE (VERAMYST) 27.5 MCG/SPRAY NASAL SPRAY    Place 1 spray into the nose daily.   GUAIFENESIN (MUCINEX) 600 MG 12 HR TABLET    Take by mouth. Every 12 hours for 10 days started 08/10/15   HYDROCORTISONE (ANUSOL-HC) 2.5 % RECTAL CREAM    Place 1 application rectally as needed for hemorrhoids or itching.   IPRATROPIUM-ALBUTEROL (DUONEB) 0.5-2.5 (3) MG/3ML SOLN    Take 3 mLs by nebulization. One vial inhaled every 6 hours as needed for cough, congestion for 10  days -started 08/10/15   LISINOPRIL (PRINIVIL,ZESTRIL) 20 MG TABLET    Take 20 mg by mouth daily.   MELATONIN 5 MG TABS    Take by mouth. Take one at bedtime   MIRTAZAPINE (REMERON) 15 MG TABLET    Take 15 mg by mouth at bedtime.   OMEPRAZOLE (PRILOSEC) 20 MG CAPSULE    Take 20 mg by mouth daily with lunch.    POLYETHYLENE GLYCOL (MIRALAX / GLYCOLAX) PACKET    Take 17 g by mouth every other day.    SIMETHICONE (MYLICON) 80 MG CHEWABLE TABLET    Chew 160 mg by mouth 2 (two) times daily.   TRAMADOL (ULTRAM) 50 MG TABLET    Take 1 tablet (50 mg total) by mouth every 6 (six) hours as needed for moderate pain.   VITAMIN B-12 (CYANOCOBALAMIN) 1000 MCG TABLET    Take 1,000 mcg by mouth daily.    ZOLPIDEM (AMBIEN) 5 MG TABLET    Take one tablet by mouth once daily at bedtime for rest (control)  Modified Medications   No medications on file  Discontinued Medications   No medications on file     Physical Exam:  Filed Vitals:   08/12/15 1133  BP: 135/71  Pulse: 80  Temp: 98.4 F (36.9 C)  Resp: 17  Weight: 94 lb (42.638 kg)  SpO2: 95%   Wt Readings from Last 3 Encounters:  08/12/15 94 lb (42.638 kg)  08/10/15 94 lb (42.638 kg)  07/15/15 95 lb 9.6 oz (43.364 kg)   Physical Exam  Constitutional: No distress.  Frail  HENT:  Head: Normocephalic and atraumatic.  Neck: No JVD present. No tracheal deviation present.  Cardiovascular: Normal rate, regular rhythm, normal heart sounds and intact distal pulses.   No murmur heard. Pulmonary/Chest: Effort normal and breath sounds normal. No respiratory distress. She has no wheezes. She has no rales.  Abdominal: Soft. Bowel sounds are normal. She exhibits no distension. There is no tenderness.  Musculoskeletal: She exhibits no edema or tenderness.  Lymphadenopathy:    She has no cervical adenopathy.  Neurological: She is alert. No cranial nerve deficit.  Oriented x2  Skin: Skin is warm and dry. No rash noted. She is not diaphoretic. No  erythema.  Psychiatric: Affect normal.    Labs reviewed/Significant Diagnostic Results:  EKG in 07/2014 showed, ST, PACs, LAFB, old anterior MI, with ST elevation.   Basic Metabolic Panel:  Recent Labs  08/27/14 1022  04/02/15 05/13/15 08/11/15  NA 133*  < > 136* 134* 137  K 3.7  < > 3.7 3.4 4.1  CL 99  --   --   --   --   CO2 24  --   --   --   --   GLUCOSE 117*  --   --   --   --   BUN 10  < >  19 13 17   CREATININE 0.9  < > 0.6 0.6 0.7  CALCIUM 9.4  --   --   --   --   < > = values in this interval not displayed. Liver Function Tests:  Recent Labs  08/27/14 1022  AST 20  ALT 11  ALKPHOS 125*  BILITOT 0.3  PROT 6.6  ALBUMIN 2.7*   No results for input(s): LIPASE, AMYLASE in the last 8760 hours. No results for input(s): AMMONIA in the last 8760 hours. CBC:  Recent Labs  08/27/14 1022 10/08/14 08/11/15  WBC 14.4* 9.0 6.0  NEUTROABS 11.1*  --   --   HGB 12.6 12.3 13.5  HCT 38.8 39 41  MCV 91.3  --   --   PLT 740.0* 443* 303   Lab Results  Component Value Date   TSH 4.26 11/17/2014      Assessment/Plan    1. Primary osteoarthritis of right shoulder -stable, continue scheduled tylenol  2. Essential hypertension -controlled, continue lisinopril and cardizem -monitor BMP periodically, most recent BMP WNL -Goal BP <150/90 due to age and fall risk  3. Insomnia -improved -continue melatonin and ambien  4. Incontinence in female -continue toileting q 2 hr while awake      Cindi Carbon, [place] [phone]

## 2015-09-11 NOTE — Progress Notes (Signed)
Patient ID: [name], female   DOB: [birth year], 82 y.o.   MRN: [id number]  Location:  [facility] Provider:  Jonelle Sidle L. Mariea Clonts, D.O., C.M.D. Hollace Kinnier, DO DOS:  08/10/15  Code Status:  DNR Goals of care: Advanced Directive information Does patient have an advance directive?: Yes, Type of Advance Directive: [name];Out of facility DNR (pink MOST or yellow form)  Chief Complaint  Patient presents with  . Acute Visit    cough    HPI:  Pt is a 82 y.o. white female with h/o dementia, pulmonary nodule, DJD shoulder, constipation, b12 deficiency, gi bleed, macular degeneration, htn, generalized debility, severe protein calorie malnutrition, low back pain, urinary incontinence and others seen for an acute visit due to productive cough.  Initially cough was dry and pt was treated with standing order robitussin DM.  She's been afebrile, but "sounds terrible".  She reports feeling badly also.  When seen, she was seated in her chair in the corner of her snf room coughing and wiping her nose.  Her home care aide was with her.  She's been coughing up some yellow green sputum now, but has difficulty actually spitting it up.  She does not eat or drink well to begin with.  Her bp is elevated with the cough medicine.  She's had trouble sleeping due to coughing.    Review of Systems  Constitutional: Positive for weight loss and malaise/fatigue. Negative for fever and chills.  HENT: Positive for congestion and hearing loss. Negative for sore throat.   Eyes: Positive for blurred vision.  Respiratory: Positive for cough, sputum production, shortness of breath and wheezing. Negative for hemoptysis.   Cardiovascular: Negative for chest pain and leg swelling.  Gastrointestinal: Negative for nausea, vomiting and diarrhea.  Genitourinary: Negative for dysuria.       Incontinence  Skin: Negative for rash.  Neurological: Positive for weakness. Negative for dizziness and headaches.    Psychiatric/Behavioral: Positive for memory loss.    Past Medical History  Diagnosis Date  . Arthritis   . Carpal tunnel syndrome on left   . DJD (degenerative joint disease)   . Diverticulosis   . Macular degeneration syndrome   . GERD (gastroesophageal reflux disease)   . Headache(784.0)   . Wears glasses   . Wears hearing aid     both  . Wears partial dentures     top and bottom  . High blood pressure     echo 09-normal  . Lower GI bleed 06/2013    required 2 units PRBC  . Pleural effusion 08/07/2014  . Essential hypertension 06/29/2013    D/c 'd amlodipine  08/27/2014 due to low bp and relative tachycardia     Past Surgical History  Procedure Laterality Date  . Appendectomy    . Hip surgery      left  . Knee surgery    . Tonsillectomy    . Thyroid cyst excision    . Eye surgery      cataractas  . Carpal tunnel release  2/12    rt  . Joint replacement  2006    rt total hip x2  . Carpal tunnel release  04/23/2012    Procedure: CARPAL TUNNEL RELEASE;  Surgeon: Wynonia Sours, MD;  Location: [facility];  Service: Orthopedics;  Laterality: Left;    Allergies  Allergen Reactions  . Celebrex [Celecoxib] Itching  . Morphine And Related Nausea Only  . Vicodin [Hydrocodone-Acetaminophen] Itching  .  Sulfa Antibiotics Rash      Medication List       This list is accurate as of: 08/10/15 11:59 PM.  Always use your most recent med list.               acetaminophen 500 MG tablet  Commonly known as:  TYLENOL  Take 1,000 mg by mouth 2 (two) times daily.     CALCIUM 600 + D PO  Take 1 tablet by mouth 2 (two) times daily.     cetirizine 10 MG tablet  Commonly known as:  ZYRTEC  Take 10 mg by mouth every morning.     CORICIDIN HBP COUGH/COLD PO  Take by mouth. 2 teaspoon every 6 hours as needed for cough for 10 days, started 08/10/15     diltiazem 120 MG tablet  Commonly known as:  CARDIZEM  Take 120 mg by mouth daily.     docusate sodium  100 MG capsule  Commonly known as:  COLACE  Take 100 mg by mouth 2 (two) times daily.     DUONEB 0.5-2.5 (3) MG/3ML Soln  Generic drug:  ipratropium-albuterol  Take 3 mLs by nebulization. One vial inhaled every 6 hours as needed for cough, congestion for 10 days -started 08/10/15     feeding supplement (ENSURE COMPLETE) Liqd  Take 237 mLs by mouth 3 (three) times daily between meals.     fluticasone 27.5 MCG/SPRAY nasal spray  Commonly known as:  VERAMYST  Place 1 spray into the nose daily.     guaiFENesin 600 MG 12 hr tablet  Commonly known as:  MUCINEX  Take by mouth. Every 12 hours for 10 days started 08/10/15     hydrocortisone 2.5 % rectal cream  Commonly known as:  ANUSOL-HC  Place 1 application rectally as needed for hemorrhoids or itching.     lisinopril 20 MG tablet  Commonly known as:  PRINIVIL,ZESTRIL  Take 20 mg by mouth daily.     Melatonin 5 MG Tabs  Take by mouth. Take one at bedtime     mirtazapine 15 MG tablet  Commonly known as:  REMERON  Take 15 mg by mouth at bedtime.     omeprazole 20 MG capsule  Commonly known as:  PRILOSEC  Take 20 mg by mouth daily with lunch.     polyethylene glycol packet  Commonly known as:  MIRALAX / GLYCOLAX  Take 17 g by mouth every other day.     simethicone 80 MG chewable tablet  Commonly known as:  MYLICON  Chew 0000000 mg by mouth 2 (two) times daily.     traMADol 50 MG tablet  Commonly known as:  ULTRAM  Take 1 tablet (50 mg total) by mouth every 6 (six) hours as needed for moderate pain.     vitamin B-12 1000 MCG tablet  Commonly known as:  CYANOCOBALAMIN  Take 1,000 mcg by mouth daily.     zolpidem 5 MG tablet  Commonly known as:  AMBIEN  Take one tablet by mouth once daily at bedtime for rest (control)        Immunization History  Administered Date(s) Administered  . Influenza Split 05/28/2014  . Influenza-Unspecified 06/10/2015  . Pneumococcal Conjugate-13 03/03/2014   Pertinent  Health Maintenance  Due  Topic Date Due  . PNA vac Low Risk Adult (2 of 2 - PPSV23) 03/04/2015  . INFLUENZA VACCINE  03/28/2016  . DEXA SCAN  Completed   Fall Risk  08/12/2015 07/15/2015 02/15/2015 01/07/2015  01/07/2015  Falls in the past year? Yes Yes Yes Yes Yes  Number falls in past yr: 1 1 1 1 1   Injury with Fall? No - - Yes -  Risk Factor Category  - - High Fall Risk High Fall Risk -  Risk for fall due to : History of fall(s);Impaired balance/gait History of fall(s) History of fall(s) History of fall(s) History of fall(s);Impaired balance/gait  Follow up Falls evaluation completed Falls evaluation completed - Falls evaluation completed Falls evaluation completed    Filed Vitals:   08/10/15 1402  BP: 144/88  Pulse: 74  Temp: 98.4 F (36.9 C)  Resp: 20  Height: 5\' 3"  (1.6 m)  Weight: 94 lb (42.638 kg)  SpO2: 94%   Body mass index is 16.66 kg/(m^2). Physical Exam  Constitutional:  Frail white female seated in her wingback chair in corner, coughing, appears uncomfortable  HENT:  Head: Normocephalic and atraumatic.  Mouth/Throat: Oropharynx is clear and moist. No oropharyngeal exudate.  Nasal congestion  Eyes: Conjunctivae are normal.  Neck: Neck supple.  Cardiovascular: Normal rate, regular rhythm and intact distal pulses.   Pulmonary/Chest: Effort normal.  Coarse wet rhonchi and some expiratory wheezes scattered throughout lung fields  Abdominal: Soft. Bowel sounds are normal.  Lymphadenopathy:    She has no cervical adenopathy.  Neurological: She is alert.  Skin: Skin is warm and dry.  Psychiatric: She has a normal mood and affect.    Labs reviewed:  Recent Labs  04/02/15 05/13/15 08/11/15  NA 136* 134* 137  K 3.7 3.4 4.1  BUN 19 13 17   CREATININE 0.6 0.6 0.7   No results for input(s): AST, ALT, ALKPHOS, BILITOT, PROT, ALBUMIN in the last 8760 hours.  Recent Labs  10/08/14 08/11/15  WBC 9.0 6.0  HGB 12.3 13.5  HCT 39 41  PLT 443* 303   Lab Results  Component Value Date    TSH 4.26 11/17/2014   Assessment/Plan 1. Acute bronchitis, unspecified organism -due to her baseline lung disease with pulmonary nodule and effusion, she is at high risk of declining into pneumonia -obtain cxr -duonebs q6hx10d -coricidin bp in place of robitussin dm -hydrate hydrate hydrate -mucinex (plain) 600mg  po q12h -vs q shift WA  2. Debility -intake poor at baseline and worse with acute illness -requires close monitoring and cueing  3. Protein-calorie malnutrition, severe (HCC) -cont supplements as tolerates, hydration, and encourage po intake -is on remeron for this and depression (?working)  4. Dementia, without behavioral disturbance -not on meds for her memory loss, already in SNF and has no behavioral difficulties so unlikely to benefit her at this point -has caregiver help 1:1 also  5. Active advance directive - DNR (Do Not Resuscitate) documented in epic  Family/ staff Communication: discussed with caregiver and nursing staff and supervisor  Labs/tests ordered:  CXR, cbc, bmp next draw [place]. Almer Littleton, D.O. [facility] [address], [place] [address] Cell Phone (Mon-Fri 8am-5pm):  [phone] On Call:  [phone] & follow prompts after 5pm & weekends Office Phone:  [phone] Office Fax:  [phone]

## 2015-09-16 ENCOUNTER — Encounter: Payer: Self-pay | Admitting: Adult Health

## 2015-09-16 ENCOUNTER — Non-Acute Institutional Stay (SKILLED_NURSING_FACILITY): Payer: Medicare Other | Admitting: Adult Health

## 2015-09-16 DIAGNOSIS — F329 Major depressive disorder, single episode, unspecified: Secondary | ICD-10-CM | POA: Insufficient documentation

## 2015-09-16 DIAGNOSIS — J302 Other seasonal allergic rhinitis: Secondary | ICD-10-CM

## 2015-09-16 DIAGNOSIS — I10 Essential (primary) hypertension: Secondary | ICD-10-CM

## 2015-09-16 DIAGNOSIS — H109 Unspecified conjunctivitis: Secondary | ICD-10-CM

## 2015-09-16 DIAGNOSIS — K5901 Slow transit constipation: Secondary | ICD-10-CM | POA: Diagnosis not present

## 2015-09-16 DIAGNOSIS — F32A Depression, unspecified: Secondary | ICD-10-CM

## 2015-09-16 DIAGNOSIS — J309 Allergic rhinitis, unspecified: Secondary | ICD-10-CM | POA: Insufficient documentation

## 2015-09-16 NOTE — Progress Notes (Signed)
Patient ID: [name], female   DOB: 08-28-1917, 82 y.o.   MRN: [id number]    Code status DNR  Patient Care Team: Gayland Curry, DO as PCP - General (Geriatric Medicine) [facility] Location:  [place] of Service: SNF [phone])  Chief Complaint  Patient presents with  . Medical Management of Chronic Issues    HPI:  82 y.o. female residing at [facility], skilled care section. I am her to review her chronic diseases. She has a hx of memory loss, debility, right pleural effusion, htn, protein calorie malnutrition, htn, OA, constipation, GIB, and GERD. She is currently on Remeron for weight loss and depression. Weight stable at 93 lbs, but below goal. Currently receiving ensure for supplement Reports eye drainage and irritation. No fever, cough, eye pain, or change in vision.   Has a hx of allergic rhinitis, currently on zyrtec and veramyst. Resident reports chronic congestion but thinks veramyst helps. Does not have any watery, itchy eyes, does have watery nasal drainage at times. BP better controlled without reports of CP, SOB, or edema. She continues to walk with a walker and one person assistance.   Functional status: mostly sits in the recliner, can walk with assistance and a walker   Review of Systems:  Review of Systems  Constitutional: Negative for fever, chills, diaphoresis, activity change, appetite change, fatigue and unexpected weight change.  HENT: Positive for congestion. Negative for postnasal drip, rhinorrhea and trouble swallowing.        HOH  Eyes: Positive for discharge and redness. Negative for photophobia, pain, itching and visual disturbance.       Poor vision  Respiratory: Negative for apnea, cough, shortness of breath and wheezing.   Cardiovascular: Negative for chest pain, palpitations and leg swelling.  Gastrointestinal: Negative for nausea, vomiting, diarrhea,  constipation, abdominal distention and rectal pain.       Bloating and excessive gas  Genitourinary: Negative for dysuria, flank pain and difficulty urinating.  Musculoskeletal: Positive for arthralgias and gait problem. Negative for back pain and joint swelling.       Chronic right shoulder pain.   Skin: Negative for color change, rash and wound.  Neurological: Negative for dizziness, seizures, speech difficulty and numbness.  Psychiatric/Behavioral: Negative for confusion, sleep disturbance and agitation. The patient is not nervous/anxious.     Medications: Patient's Medications  New Prescriptions   No medications on file  Previous Medications   ACETAMINOPHEN (TYLENOL) 500 MG TABLET    Take 1,000 mg by mouth 2 (two) times daily.    CALCIUM CARB-CHOLECALCIFEROL (CALCIUM 600 + D PO)    Take 1 tablet by mouth 2 (two) times daily.   CETIRIZINE (ZYRTEC) 10 MG TABLET    Take 10 mg by mouth every morning.    DILTIAZEM (CARDIZEM) 120 MG TABLET    Take 120 mg by mouth daily.   DOCUSATE SODIUM (COLACE) 100 MG CAPSULE    Take 100 mg by mouth 2 (two) times daily.   FEEDING SUPPLEMENT, ENSURE COMPLETE, (ENSURE COMPLETE) LIQD    Take 237 mLs by mouth 3 (three) times daily between meals.   FLUTICASONE (VERAMYST) 27.5 MCG/SPRAY NASAL SPRAY    Place 1 spray into the nose daily.   HYDROCORTISONE (ANUSOL-HC) 2.5 % RECTAL CREAM    Place 1 application rectally as needed for hemorrhoids or itching.   IPRATROPIUM-ALBUTEROL (DUONEB) 0.5-2.5 (3) MG/3ML SOLN    Take 3 mLs by nebulization.  One vial inhaled every 6 hours as needed for cough, congestion for 10 days -started 08/10/15   LISINOPRIL (PRINIVIL,ZESTRIL) 20 MG TABLET    Take 20 mg by mouth daily.   MELATONIN 5 MG TABS    Take by mouth. Take one at bedtime   MIRTAZAPINE (REMERON) 15 MG TABLET    Take 15 mg by mouth at bedtime.   OMEPRAZOLE (PRILOSEC) 20 MG CAPSULE    Take 20 mg by mouth daily with lunch.    POLYETHYLENE GLYCOL (MIRALAX / GLYCOLAX) PACKET     Take 17 g by mouth every other day.    SIMETHICONE (MYLICON) 80 MG CHEWABLE TABLET    Chew 160 mg by mouth 2 (two) times daily.   TRAMADOL (ULTRAM) 50 MG TABLET    Take 1 tablet (50 mg total) by mouth every 6 (six) hours as needed for moderate pain.   VITAMIN B-12 (CYANOCOBALAMIN) 1000 MCG TABLET    Take 1,000 mcg by mouth daily.    ZOLPIDEM (AMBIEN) 5 MG TABLET    Take one tablet by mouth once daily at bedtime for rest (control)  Modified Medications   No medications on file  Discontinued Medications   CHLORPHENIRAMINE-DM (CORICIDIN HBP COUGH/COLD PO)    Take by mouth. 2 teaspoon every 6 hours as needed for cough for 10 days, started 08/10/15   GUAIFENESIN (MUCINEX) 600 MG 12 HR TABLET    Take by mouth. Every 12 hours for 10 days started 08/10/15     Physical Exam: VS reviewed  Wt Readings from Last 3 Encounters:  09/16/15 93 lb 6.4 oz (42.366 kg)  08/12/15 94 lb (42.638 kg)  08/10/15 94 lb (42.638 kg)   Physical Exam  Constitutional: No distress.  Frail  HENT:  Head: Normocephalic and atraumatic.  Eyes: EOM are normal. Pupils are equal, round, and reactive to light. Right eye exhibits discharge. Left eye exhibits discharge.  Purulent drainage to both eyes, slight erythema, able to see 2 fingers/. Limbus clear.  Neck: No JVD present. No tracheal deviation present.  Cardiovascular: Normal rate, regular rhythm, normal heart sounds and intact distal pulses.   No murmur heard. Pulmonary/Chest: Effort normal and breath sounds normal. No respiratory distress. She has no wheezes. She has no rales.  Abdominal: Soft. Bowel sounds are normal. She exhibits no distension. There is no tenderness.  Musculoskeletal: She exhibits no edema or tenderness.  Lymphadenopathy:    She has no cervical adenopathy.  Neurological: She is alert. No cranial nerve deficit.  Oriented x2  Skin: Skin is warm and dry. No rash noted. She is not diaphoretic. No erythema.  Psychiatric: Affect normal.    Labs  reviewed/Significant Diagnostic Results:  EKG in 07/2014 showed, ST, PACs, LAFB, old anterior MI, with ST elevation.   Basic Metabolic Panel:  Recent Labs  04/02/15 05/13/15 08/11/15  NA 136* 134* 137  K 3.7 3.4 4.1  BUN 19 13 17   CREATININE 0.6 0.6 0.7   Liver Function Tests: No results for input(s): AST, ALT, ALKPHOS, BILITOT, PROT, ALBUMIN in the last 8760 hours. No results for input(s): LIPASE, AMYLASE in the last 8760 hours. No results for input(s): AMMONIA in the last 8760 hours. CBC:  Recent Labs  10/08/14 08/11/15  WBC 9.0 6.0  HGB 12.3 13.5  HCT 39 41  PLT 443* 303   Lab Results  Component Value Date   TSH 4.26 11/17/2014      Assessment/Plan    1. Bilateral conjunctivitis -polymyxin B/trimethoprim 2 gtts OU  4 x day for 5 days  2. Essential hypertension -controlled, continue lisinopril, cardizem -BMP ok  3. Slow transit constipation -controlled continue colace and senokot  4. Depression -improved with remeron per caregiver, originally started for weight loss  5. Other seasonal allergic rhinitis -reports nasal congestion when veramyst is withheld -continue zyrtec and veramyst      Cindi Carbon, [place] [phone]

## 2015-09-20 ENCOUNTER — Other Ambulatory Visit: Payer: Self-pay | Admitting: *Deleted

## 2015-09-20 MED ORDER — TRAMADOL HCL 50 MG PO TABS: 50.0000 mg | ORAL_TABLET | Freq: Four times a day (QID) | ORAL | Status: DC | PRN

## 2015-09-20 NOTE — Telephone Encounter (Signed)
[place]

## 2015-10-21 ENCOUNTER — Encounter: Payer: Self-pay | Admitting: Adult Health

## 2015-10-21 ENCOUNTER — Non-Acute Institutional Stay (SKILLED_NURSING_FACILITY): Payer: Medicare Other | Admitting: Adult Health

## 2015-10-21 DIAGNOSIS — I10 Essential (primary) hypertension: Secondary | ICD-10-CM | POA: Diagnosis not present

## 2015-10-21 DIAGNOSIS — K5901 Slow transit constipation: Secondary | ICD-10-CM | POA: Diagnosis not present

## 2015-10-21 DIAGNOSIS — R634 Abnormal weight loss: Secondary | ICD-10-CM | POA: Diagnosis not present

## 2015-10-21 DIAGNOSIS — R140 Abdominal distension (gaseous): Secondary | ICD-10-CM

## 2015-10-21 DIAGNOSIS — J302 Other seasonal allergic rhinitis: Secondary | ICD-10-CM | POA: Diagnosis not present

## 2015-10-21 DIAGNOSIS — G4700 Insomnia, unspecified: Secondary | ICD-10-CM | POA: Diagnosis not present

## 2015-10-21 NOTE — Progress Notes (Signed)
Patient ID: [name], female   DOB: 1917-03-29, 82 y.o.   MRN: [id number]    Code status DNR  Patient Care Team: Gayland Curry, DO as PCP - General (Geriatric Medicine) [facility] [place] Location:  [place] of Service: SNF [phone])  Chief Complaint  Patient presents with  . Medical Management of Chronic Issues    HPI:  82 y.o. female residing at [facility], skilled care section. I am her to review her chronic diseases. She has a hx of memory loss, debility, right pleural effusion, htn, protein calorie malnutrition, htn, OA, constipation, GIB, and GERD. She is currently on Remeron for weight loss and depression. Weight has increased to 96 lbs, 3 lb gain from last month. Currently receiving ensure for supplement Has a hx of allergic rhinitis, currently on zyrtec and veramyst. Resident reports chronic congestion but thinks veramyst helps. Does not have any watery, itchy eyes, does have watery nasal drainage at times. BP better controlled without reports of CP, SOB, or edema. She continues to walk with a walker and one person assistance. Reports regular BMs on miralax. Has difficulty sleeping at night occasionally and reports that ambien and melatonin help.  Functional status: mostly sits in the recliner, can walk with assistance and a walker   Review of Systems:  Review of Systems  Constitutional: Negative for fever, chills, diaphoresis, activity change, appetite change, fatigue and unexpected weight change.  HENT: Negative for congestion, postnasal drip, rhinorrhea and trouble swallowing.        HOH  Eyes:       Poor vision  Respiratory: Negative for apnea, cough, shortness of breath and wheezing.   Cardiovascular: Negative for chest pain, palpitations and leg swelling.  Gastrointestinal: Negative for nausea, vomiting, diarrhea, constipation, abdominal distention and rectal pain.       Bloating  and excessive gas  Genitourinary: Negative for dysuria, flank pain and difficulty urinating.  Musculoskeletal: Positive for arthralgias and gait problem. Negative for back pain and joint swelling.       Chronic right shoulder pain.   Skin: Negative for color change, rash and wound.  Neurological: Negative for dizziness, seizures, speech difficulty and numbness.  Psychiatric/Behavioral: Positive for sleep disturbance. Negative for confusion and agitation. The patient is not nervous/anxious.     Medications: Patient's Medications  New Prescriptions   No medications on file  Previous Medications   ACETAMINOPHEN (TYLENOL) 500 MG TABLET    Take 1,000 mg by mouth 2 (two) times daily.    CALCIUM CARB-CHOLECALCIFEROL (CALCIUM 600 + D PO)    Take 1 tablet by mouth 2 (two) times daily.   CETIRIZINE (ZYRTEC) 10 MG TABLET    Take 10 mg by mouth every morning.    DILTIAZEM (CARDIZEM) 120 MG TABLET    Take 120 mg by mouth daily.   DOCUSATE SODIUM (COLACE) 100 MG CAPSULE    Take 100 mg by mouth 2 (two) times daily.   FEEDING SUPPLEMENT, ENSURE COMPLETE, (ENSURE COMPLETE) LIQD    Take 237 mLs by mouth 3 (three) times daily between meals.   FLUTICASONE (VERAMYST) 27.5 MCG/SPRAY NASAL SPRAY    Place 1 spray into the nose daily.   IPRATROPIUM-ALBUTEROL (DUONEB) 0.5-2.5 (3) MG/3ML SOLN    Take 3 mLs by nebulization. One vial inhaled every 6 hours as needed for cough, congestion for 10 days -started 08/10/15   LISINOPRIL (PRINIVIL,ZESTRIL) 20 MG TABLET    Take 20 mg  by mouth daily.   MELATONIN 5 MG TABS    Take by mouth. Take one at bedtime   MIRTAZAPINE (REMERON) 15 MG TABLET    Take 15 mg by mouth at bedtime.   OMEPRAZOLE (PRILOSEC) 20 MG CAPSULE    Take 20 mg by mouth daily with lunch.    POLYETHYLENE GLYCOL (MIRALAX / GLYCOLAX) PACKET    Take 17 g by mouth every other day.    SIMETHICONE (MYLICON) 80 MG CHEWABLE TABLET    Chew 160 mg by mouth 2 (two) times daily.   TRAMADOL (ULTRAM) 50 MG TABLET    Take 1  tablet (50 mg total) by mouth every 6 (six) hours as needed for moderate pain.   VITAMIN B-12 (CYANOCOBALAMIN) 1000 MCG TABLET    Take 1,000 mcg by mouth daily.    ZOLPIDEM (AMBIEN) 5 MG TABLET    Take one tablet by mouth once daily at bedtime for rest (control)  Modified Medications   No medications on file  Discontinued Medications   HYDROCORTISONE (ANUSOL-HC) 2.5 % RECTAL CREAM    Place 1 application rectally as needed for hemorrhoids or itching.     Physical Exam: VS reviewed  Wt Readings from Last 3 Encounters:  10/21/15 96 lb 3.2 oz (43.636 kg)  09/16/15 93 lb 6.4 oz (42.366 kg)  08/12/15 94 lb (42.638 kg)   Physical Exam  Constitutional: No distress.  Frail  HENT:  Head: Normocephalic and atraumatic.  Eyes: EOM are normal. Pupils are equal, round, and reactive to light.  Neck: No JVD present. No tracheal deviation present.  Cardiovascular: Normal rate, regular rhythm, normal heart sounds and intact distal pulses.   No murmur heard. Pulmonary/Chest: Effort normal and breath sounds normal. No respiratory distress. She has no wheezes. She has no rales.  Abdominal: Soft. Bowel sounds are normal. She exhibits no distension. There is no tenderness.  Musculoskeletal: She exhibits no edema or tenderness.  Lymphadenopathy:    She has no cervical adenopathy.  Neurological: She is alert. No cranial nerve deficit.  Oriented x2  Skin: Skin is warm and dry. No rash noted. She is not diaphoretic. No erythema.  Psychiatric: Affect normal.  Nursing note and vitals reviewed.   Labs reviewed/Significant Diagnostic Results:  EKG in 07/2014 showed, ST, PACs, LAFB, old anterior MI, with ST elevation.   Basic Metabolic Panel:  Recent Labs  04/02/15 05/13/15 08/11/15  NA 136* 134* 137  K 3.7 3.4 4.1  BUN 19 13 17   CREATININE 0.6 0.6 0.7   Liver Function Tests: No results for input(s): AST, ALT, ALKPHOS, BILITOT, PROT, ALBUMIN in the last 8760 hours. No results for input(s):  LIPASE, AMYLASE in the last 8760 hours. No results for input(s): AMMONIA in the last 8760 hours. CBC:  Recent Labs  08/11/15  WBC 6.0  HGB 13.5  HCT 41  PLT 303   Lab Results  Component Value Date   TSH 4.26 11/17/2014      Assessment/Plan   1. Essential hypertension -controlled -continue lisinopril and cardizem  2. Other seasonal allergic rhinitis -controlled -continue vermamyst  3. Slow transit constipation -stable -continue mirlax  4. Insomnia -improved -continue melatonin and ambien  5. Bloating -continues to report excess gas -should try avoiding straws and chewing gum regularly to prevent air swallowing  7. Weight loss -trending upward -continue remeron    Cindi Carbon, [place] [phone]

## 2015-12-09 ENCOUNTER — Encounter: Payer: Self-pay | Admitting: Adult Health

## 2015-12-09 ENCOUNTER — Non-Acute Institutional Stay (SKILLED_NURSING_FACILITY): Payer: Medicare Other | Admitting: Adult Health

## 2015-12-09 DIAGNOSIS — R140 Abdominal distension (gaseous): Secondary | ICD-10-CM | POA: Diagnosis not present

## 2015-12-09 DIAGNOSIS — G4700 Insomnia, unspecified: Secondary | ICD-10-CM | POA: Diagnosis not present

## 2015-12-09 DIAGNOSIS — I10 Essential (primary) hypertension: Secondary | ICD-10-CM | POA: Diagnosis not present

## 2015-12-09 DIAGNOSIS — R634 Abnormal weight loss: Secondary | ICD-10-CM

## 2015-12-09 DIAGNOSIS — M19011 Primary osteoarthritis, right shoulder: Secondary | ICD-10-CM

## 2015-12-09 NOTE — Progress Notes (Signed)
Patient ID: [name], female   DOB: [birth year], 82 y.o.   MRN: [id number]    Code status DNR  Patient Care Team: Gayland Curry, DO as PCP - General (Geriatric Medicine) [facility] [place] Location:  [place] of Service:    Chief Complaint  Patient presents with  . Medical Management of Chronic Issues    HPI:  82 y.o. female residing at [facility], skilled care section. I am her to review her chronic diseases. She has a hx of memory loss, debility, right pleural effusion, htn, protein calorie malnutrition, OA, constipation, GIB, and GERD.  OA: noted to right shoulder, no surgery per resident. Currently on tylenol scheduled and prn ultram (has not used in the past 14 days)  Insomnia: currently on ambien and melatonin, sleeps well  Excessive gas: on simethicone, recommended to stop chewing gum at the last visit but it seems to help her with anxiety  Weight loss: on remeron , weight trending up to 102 lbs, also on ensure daily  HTN: BP well controlled with cardizem and lisinopril  Functional status: mostly sits in the recliner, can walk with assistance and a walker   Review of Systems:  Review of Systems  Constitutional: Negative for fever, chills, diaphoresis, activity change, appetite change, fatigue and unexpected weight change.  HENT: Negative for congestion, postnasal drip, rhinorrhea and trouble swallowing.        HOH  Eyes:       Poor vision  Respiratory: Negative for apnea, cough, shortness of breath and wheezing.   Cardiovascular: Negative for chest pain, palpitations and leg swelling.  Gastrointestinal: Negative for nausea, vomiting, diarrhea, constipation, abdominal distention and rectal pain.       Bloating and excessive gas  Genitourinary: Negative for dysuria, flank pain and difficulty urinating.  Musculoskeletal: Positive for arthralgias and gait problem. Negative for back  pain and joint swelling.       Chronic right shoulder pain.   Skin: Negative for color change, rash and wound.  Neurological: Negative for dizziness, seizures, speech difficulty and numbness.  Psychiatric/Behavioral: Positive for sleep disturbance. Negative for confusion and agitation. The patient is not nervous/anxious.     Medications: Patient's Medications  New Prescriptions   No medications on file  Previous Medications   ACETAMINOPHEN (TYLENOL) 500 MG TABLET    Take 1,000 mg by mouth 2 (two) times daily.    CALCIUM CARB-CHOLECALCIFEROL (CALCIUM 600 + D PO)    Take 1 tablet by mouth 2 (two) times daily.   CETIRIZINE (ZYRTEC) 10 MG TABLET    Take 10 mg by mouth every morning.    DILTIAZEM (CARDIZEM) 120 MG TABLET    Take 120 mg by mouth daily.   DOCUSATE SODIUM (COLACE) 100 MG CAPSULE    Take 100 mg by mouth 2 (two) times daily.   FEEDING SUPPLEMENT, ENSURE COMPLETE, (ENSURE COMPLETE) LIQD    Take 237 mLs by mouth 3 (three) times daily between meals.   FLUTICASONE (VERAMYST) 27.5 MCG/SPRAY NASAL SPRAY    Place 1 spray into the nose daily.   LISINOPRIL (PRINIVIL,ZESTRIL) 20 MG TABLET    Take 20 mg by mouth daily.   MELATONIN 5 MG TABS    Take by mouth. Take one at bedtime   MIRTAZAPINE (REMERON) 15 MG TABLET    Take 15 mg by mouth at bedtime.   OMEPRAZOLE (PRILOSEC) 20 MG CAPSULE    Take 20 mg by mouth  daily with lunch.    POLYETHYLENE GLYCOL (MIRALAX / GLYCOLAX) PACKET    Take 17 g by mouth every other day.    SIMETHICONE (MYLICON) 80 MG CHEWABLE TABLET    Chew 160 mg by mouth 2 (two) times daily.   TRAMADOL (ULTRAM) 50 MG TABLET    Take 1 tablet (50 mg total) by mouth every 6 (six) hours as needed for moderate pain.   VITAMIN B-12 (CYANOCOBALAMIN) 1000 MCG TABLET    Take 1,000 mcg by mouth daily.    ZOLPIDEM (AMBIEN) 5 MG TABLET    Take one tablet by mouth once daily at bedtime for rest (control)  Modified Medications   No medications on file  Discontinued Medications    IPRATROPIUM-ALBUTEROL (DUONEB) 0.5-2.5 (3) MG/3ML SOLN    Take 3 mLs by nebulization. One vial inhaled every 6 hours as needed for cough, congestion for 10 days -started 08/10/15     Physical Exam: BP 137/79 mmHg  Pulse 86  Temp(Src) 98.5 F (36.9 C)  Resp 18  Wt 102 lb (46.267 kg)  SpO2 96%   Wt Readings from Last 3 Encounters:  12/09/15 102 lb (46.267 kg)  10/21/15 96 lb 3.2 oz (43.636 kg)  09/16/15 93 lb 6.4 oz (42.366 kg)   Physical Exam  Constitutional: No distress.  Frail  HENT:  Head: Normocephalic and atraumatic.  Eyes: EOM are normal. Pupils are equal, round, and reactive to light.  Neck: No JVD present. No tracheal deviation present.  Cardiovascular: Normal rate, regular rhythm, normal heart sounds and intact distal pulses.   No murmur heard. Pulmonary/Chest: Effort normal and breath sounds normal. No respiratory distress. She has no wheezes. She has no rales.  Abdominal: Soft. Bowel sounds are normal. She exhibits no distension. There is no tenderness.  Musculoskeletal: She exhibits no edema or tenderness.  Decreased ROM to right shoulder with crepitus  Lymphadenopathy:    She has no cervical adenopathy.  Neurological: She is alert. No cranial nerve deficit.  Oriented x2  Skin: Skin is warm and dry. No rash noted. She is not diaphoretic. No erythema.  Psychiatric: Affect normal.  Nursing note and vitals reviewed.   Labs reviewed/Significant Diagnostic Results:  EKG in 07/2014 showed, ST, PACs, LAFB, old anterior MI, with ST elevation.   Basic Metabolic Panel:  Recent Labs  04/02/15 05/13/15 08/11/15  NA 136* 134* 137  K 3.7 3.4 4.1  BUN 19 13 17   CREATININE 0.6 0.6 0.7   Liver Function Tests: No results for input(s): AST, ALT, ALKPHOS, BILITOT, PROT, ALBUMIN in the last 8760 hours. No results for input(s): LIPASE, AMYLASE in the last 8760 hours. No results for input(s): AMMONIA in the last 8760 hours. CBC:  Recent Labs  08/11/15  WBC 6.0  HGB  13.5  HCT 41  PLT 303   Lab Results  Component Value Date   TSH 4.26 11/17/2014      Assessment/Plan  Osteoarthritis, shoulder Continue tylenol and prn ultram  Insomnia Continue ambien, d/c melatonin in an attempt to reduce pill burden  Loss of weight Improved, continue remeron  Bloating Continue simethicone, try to cut down on gum chewing  Essential hypertension Stable, continue current meds    Cindi Carbon, [place] [phone]

## 2015-12-09 NOTE — Assessment & Plan Note (Signed)
Continue tylenol and prn ultram

## 2015-12-09 NOTE — Assessment & Plan Note (Signed)
Continue ambien, d/c melatonin in an attempt to reduce pill burden

## 2015-12-09 NOTE — Assessment & Plan Note (Signed)
Continue simethicone, try to cut down on gum chewing

## 2015-12-09 NOTE — Assessment & Plan Note (Signed)
Improved, continue remeron

## 2015-12-09 NOTE — Assessment & Plan Note (Signed)
Stable, continue current meds 

## 2015-12-22 ENCOUNTER — Other Ambulatory Visit: Payer: Self-pay | Admitting: *Deleted

## 2015-12-22 MED ORDER — ZOLPIDEM TARTRATE 5 MG PO TABS: ORAL_TABLET | ORAL | Status: DC

## 2015-12-22 NOTE — Telephone Encounter (Signed)
[facility] 

## 2016-01-25 DIAGNOSIS — M2041 Other hammer toe(s) (acquired), right foot: Secondary | ICD-10-CM | POA: Diagnosis not present

## 2016-01-25 DIAGNOSIS — M2042 Other hammer toe(s) (acquired), left foot: Secondary | ICD-10-CM | POA: Diagnosis not present

## 2016-01-25 DIAGNOSIS — B351 Tinea unguium: Secondary | ICD-10-CM | POA: Diagnosis not present

## 2016-01-28 ENCOUNTER — Non-Acute Institutional Stay (SKILLED_NURSING_FACILITY): Payer: Medicare Other | Admitting: Adult Health

## 2016-01-28 DIAGNOSIS — R634 Abnormal weight loss: Secondary | ICD-10-CM | POA: Diagnosis not present

## 2016-01-28 DIAGNOSIS — R143 Flatulence: Secondary | ICD-10-CM | POA: Diagnosis not present

## 2016-01-28 DIAGNOSIS — G4700 Insomnia, unspecified: Secondary | ICD-10-CM | POA: Diagnosis not present

## 2016-01-28 DIAGNOSIS — R413 Other amnesia: Secondary | ICD-10-CM | POA: Insufficient documentation

## 2016-01-28 DIAGNOSIS — F0390 Unspecified dementia without behavioral disturbance: Secondary | ICD-10-CM | POA: Diagnosis not present

## 2016-01-28 DIAGNOSIS — K5901 Slow transit constipation: Secondary | ICD-10-CM

## 2016-01-28 DIAGNOSIS — I10 Essential (primary) hypertension: Secondary | ICD-10-CM | POA: Diagnosis not present

## 2016-01-28 NOTE — Progress Notes (Signed)
Patient ID: [name], female   DOB: Feb 21, 1917, 82 y.o.   MRN: [id number]     Code status DNR  Patient Care Team: Gayland Curry, DO as PCP - General (Geriatric Medicine) [facility] [place] Location:  [place] of Service: SNF [phone])  Chief Complaint  Patient presents with  . Medical Management of Chronic Issues    HPI:  82 y.o. female residing at [facility], skilled care section. I am her to review her chronic diseases. She has a hx of memory loss, debility, right pleural effusion, htn, protein calorie malnutrition, OA, constipation, GIB, and GERD. She is proud to report that she has gained 2 lbs and is up to 102.8 lbs. She continues to complain of gas and bloating despite simethicone use. She chews gum frequently and doesn't want to stop. She has a very limited diet and does not want to make changes. She continues to walk 1-2x a day with assistance and a gait belt. She has some memory loss, last MMSE 21/30 but remains stable functionally and cognitively over time.  She is on ambien for sleep and this was changed to prn and she reported not being able to sleep and so it was changed back to scheduled. Functional status: mostly sits in the recliner, can walk with assistance and a walker   Review of Systems:  Review of Systems  Constitutional: Negative for fever, chills, diaphoresis, activity change, appetite change, fatigue and unexpected weight change.  HENT: Negative for congestion, postnasal drip, rhinorrhea and trouble swallowing.        HOH  Eyes:       Poor vision  Respiratory: Negative for apnea, cough, shortness of breath and wheezing.   Cardiovascular: Negative for chest pain, palpitations and leg swelling.  Gastrointestinal: Negative for nausea, vomiting, diarrhea, constipation, abdominal distention and rectal pain.       Bloating and excessive gas  Genitourinary: Negative for dysuria,  flank pain and difficulty urinating.  Musculoskeletal: Positive for arthralgias and gait problem. Negative for back pain and joint swelling.       Chronic right shoulder pain.   Skin: Negative for color change, rash and wound.  Neurological: Negative for dizziness, seizures, speech difficulty and numbness.  Psychiatric/Behavioral: Positive for sleep disturbance. Negative for confusion and agitation. The patient is not nervous/anxious.     Medications: Patient's Medications  New Prescriptions   No medications on file  Previous Medications   ACETAMINOPHEN (TYLENOL) 500 MG TABLET    Take 1,000 mg by mouth 2 (two) times daily.    CALCIUM CARB-CHOLECALCIFEROL (CALCIUM 600 + D PO)    Take 1 tablet by mouth 2 (two) times daily.   CETIRIZINE (ZYRTEC) 10 MG TABLET    Take 10 mg by mouth every morning.    DILTIAZEM (CARDIZEM) 120 MG TABLET    Take 120 mg by mouth daily.   DOCUSATE SODIUM (COLACE) 100 MG CAPSULE    Take 100 mg by mouth 2 (two) times daily.   FEEDING SUPPLEMENT, ENSURE COMPLETE, (ENSURE COMPLETE) LIQD    Take 237 mLs by mouth 3 (three) times daily between meals.   FLUTICASONE (VERAMYST) 27.5 MCG/SPRAY NASAL SPRAY    Place 1 spray into the nose daily.   LISINOPRIL (PRINIVIL,ZESTRIL) 20 MG TABLET    Take 20 mg by mouth daily.   MELATONIN 5 MG TABS    Take by mouth. Take one at bedtime   MIRTAZAPINE (REMERON) 15  MG TABLET    Take 15 mg by mouth at bedtime.   OMEPRAZOLE (PRILOSEC) 20 MG CAPSULE    Take 20 mg by mouth daily with lunch.    POLYETHYLENE GLYCOL (MIRALAX / GLYCOLAX) PACKET    Take 17 g by mouth every other day.    SIMETHICONE (MYLICON) 80 MG CHEWABLE TABLET    Chew 160 mg by mouth 2 (two) times daily.   TRAMADOL (ULTRAM) 50 MG TABLET    Take 1 tablet (50 mg total) by mouth every 6 (six) hours as needed for moderate pain.   VITAMIN B-12 (CYANOCOBALAMIN) 1000 MCG TABLET    Take 1,000 mcg by mouth daily.    ZOLPIDEM (AMBIEN) 5 MG TABLET    Take one tablet by mouth once daily at  bedtime for rest (control)  Modified Medications   No medications on file  Discontinued Medications   No medications on file     Physical Exam: BP 148/84 mmHg  Pulse 82  Temp(Src) 98.9 F (37.2 C)  Resp 20  Wt 102 lb 12.8 oz (46.63 kg)  SpO2 93%   Wt Readings from Last 3 Encounters:  01/28/16 102 lb 12.8 oz (46.63 kg)  12/09/15 102 lb (46.267 kg)  10/21/15 96 lb 3.2 oz (43.636 kg)   Physical Exam  Constitutional: No distress.  Frail  HENT:  Head: Normocephalic and atraumatic.  Eyes: EOM are normal. Pupils are equal, round, and reactive to light.  Neck: No JVD present. No tracheal deviation present.  Cardiovascular: Normal rate, regular rhythm, normal heart sounds and intact distal pulses.   No murmur heard. Pulmonary/Chest: Effort normal and breath sounds normal. No respiratory distress. She has no wheezes. She has no rales.  Abdominal: Soft. Bowel sounds are normal. She exhibits no distension. There is no tenderness.  Lymphadenopathy:    She has no cervical adenopathy.  Neurological: She is alert. No cranial nerve deficit.  Oriented x2  Skin: Skin is warm and dry. No rash noted. She is not diaphoretic. No erythema.  Psychiatric: Affect normal.  Nursing note and vitals reviewed.   Labs reviewed/Significant Diagnostic Results:  EKG in 07/2014 showed, ST, PACs, LAFB, old anterior MI, with ST elevation.   Basic Metabolic Panel:  Recent Labs  04/02/15 05/13/15 08/11/15  NA 136* 134* 137  K 3.7 3.4 4.1  BUN 19 13 17   CREATININE 0.6 0.6 0.7   Liver Function Tests: No results for input(s): AST, ALT, ALKPHOS, BILITOT, PROT, ALBUMIN in the last 8760 hours. No results for input(s): LIPASE, AMYLASE in the last 8760 hours. No results for input(s): AMMONIA in the last 8760 hours. CBC:  Recent Labs  08/11/15  WBC 6.0  HGB 13.5  HCT 41  PLT 303   Lab Results  Component Value Date   TSH 4.26 11/17/2014      Assessment/Plan  1. Essential  hypertension Controlled Continue diltiazem, lisinopril   2. Slow transit constipation Controlled, see below  3. Insomnia Improved with resuming ambien  4. Loss of weight -improved with encouraging intake and remeron  5. Dementia, without behavioral disturbance Moderate but remains able to communicate and follow commands Would not benefit from meds  6. Excessive gas -encouraged to cut down on gum chewing and see if this helps with gas -try citrucel 1 tbsp qd for 1 week and then increase to 2 tbsp qd hold for diarrhea  -d/c mirlax  Cindi Carbon, ANP [facility] [phone]

## 2016-03-06 ENCOUNTER — Encounter: Payer: Self-pay | Admitting: Adult Health

## 2016-03-06 ENCOUNTER — Non-Acute Institutional Stay (SKILLED_NURSING_FACILITY): Payer: Medicare Other | Admitting: Adult Health

## 2016-03-06 DIAGNOSIS — H9193 Unspecified hearing loss, bilateral: Secondary | ICD-10-CM | POA: Diagnosis not present

## 2016-03-06 DIAGNOSIS — G4700 Insomnia, unspecified: Secondary | ICD-10-CM

## 2016-03-06 DIAGNOSIS — K5901 Slow transit constipation: Secondary | ICD-10-CM | POA: Diagnosis not present

## 2016-03-06 DIAGNOSIS — R531 Weakness: Secondary | ICD-10-CM | POA: Diagnosis not present

## 2016-03-06 DIAGNOSIS — R634 Abnormal weight loss: Secondary | ICD-10-CM | POA: Diagnosis not present

## 2016-03-06 NOTE — Progress Notes (Signed)
Patient ID: [name], female   DOB: Jan 07, 1917, 82 y.o.   MRN: [id number]   Location:   [place]:  SNF (31) Provider:   Cindi Carbon, [place] [phone]   REED, Jonelle Sidle, DO  Patient Care Team: Gayland Curry, DO as PCP - General (Geriatric Medicine) [facility]  Extended Emergency Contact Information Primary Emergency Contact: Powell,Katherine  [place] Phone: [phone] Mobile Phone: [phone] Relation: Other Secondary Emergency Contact: Thatcher,Shirley Address: [address]  [place] of [place] Phone: [phone] Mobile Phone: [phone] Relation: Other  Code Status:  DNR Goals of care: Advanced Directive information Advanced Directives 03/06/2016  Does patient have an advance directive? Yes  Type of Paramedic of [place];Out of facility DNR (pink MOST or yellow form)  Does patient want to make changes to advanced directive? No - Patient declined  Copy of advanced directive(s) in chart? Yes     Chief Complaint  Patient presents with  . Medical Management of Chronic Issues    HPI:  Pt is a 82 y.o. female seen today for medical management of chronic diseases.   Resides in skilled care at [facility] due to age, debility, and mild memory loss. She has a private caregiver with her each morning and ambulates with a walker and one person assist. She felt weak over the weekend and did not walk. She denies any other associated symptoms.   Constipation and excessive gas: long term issues, failed trial of citrucel so therefore changed back to miralax and simethicone. Chews gum regularly and may be swallowing air.  Has regular BMs now per staff  Hearing loss: wears hearing aides bilaterally  Weight loss: weight trending upward now at 101 lbs. Currently on remeron for sleep/depression, appetite  Insomnia: uses  ambien and remeron but continues to have sleep difficulty, usually lies in bed at night for several hrs before falling asleep     Past Medical History  Diagnosis Date  . Arthritis   . Carpal tunnel syndrome on left   . DJD (degenerative joint disease)   . Diverticulosis   . Macular degeneration syndrome   . GERD (gastroesophageal reflux disease)   . Headache(784.0)   . Wears glasses   . Wears hearing aid     both  . Wears partial dentures     top and bottom  . High blood pressure     echo 09-normal  . Lower GI bleed 06/2013    required 2 units PRBC  . Pleural effusion 08/07/2014  . Essential hypertension 06/29/2013    D/c 'd amlodipine  08/27/2014 due to low bp and relative tachycardia     Past Surgical History  Procedure Laterality Date  . Appendectomy    . Hip surgery      left  . Knee surgery    . Tonsillectomy    . Thyroid cyst excision    . Eye surgery      cataractas  . Carpal tunnel release  2/12    rt  . Joint replacement  2006    rt total hip x2  . Carpal tunnel release  04/23/2012    Procedure: CARPAL TUNNEL RELEASE;  Surgeon: Wynonia Sours, MD;  Location: [place];  Service: Orthopedics;  Laterality: Left;    Allergies  Allergen Reactions  . Celebrex [Celecoxib] Itching  . Morphine And Related Nausea Only  . Vicodin [Hydrocodone-Acetaminophen]  Itching  . Sulfa Antibiotics Rash      Medication List       This list is accurate as of: 03/06/16 11:30 AM.  Always use your most recent med list.               acetaminophen 500 MG tablet  Commonly known as:  TYLENOL  Take 1,000 mg by mouth 2 (two) times daily.     CALCIUM 600 + D PO  Take 1 tablet by mouth 2 (two) times daily.     cetirizine 10 MG tablet  Commonly known as:  ZYRTEC  Take 10 mg by mouth every morning.     diltiazem 120 MG tablet  Commonly known as:  CARDIZEM  Take 120 mg by mouth daily.     docusate sodium 100 MG capsule  Commonly known as:  COLACE  Take  100 mg by mouth 2 (two) times daily.     feeding supplement (ENSURE COMPLETE) Liqd  Take 237 mLs by mouth 3 (three) times daily between meals.     fluticasone 27.5 MCG/SPRAY nasal spray  Commonly known as:  VERAMYST  Place 1 spray into the nose daily.     lisinopril 20 MG tablet  Commonly known as:  PRINIVIL,ZESTRIL  Take 20 mg by mouth daily.     Melatonin 5 MG Tabs  Take by mouth. Take one at bedtime     mirtazapine 15 MG tablet  Commonly known as:  REMERON  Take 15 mg by mouth at bedtime.     omeprazole 20 MG capsule  Commonly known as:  PRILOSEC  Take 20 mg by mouth daily with lunch.     polyethylene glycol packet  Commonly known as:  MIRALAX / GLYCOLAX  Take 17 g by mouth every other day.     simethicone 80 MG chewable tablet  Commonly known as:  MYLICON  Chew 0000000 mg by mouth 2 (two) times daily.     traMADol 50 MG tablet  Commonly known as:  ULTRAM  Take 1 tablet (50 mg total) by mouth every 6 (six) hours as needed for moderate pain.     vitamin B-12 1000 MCG tablet  Commonly known as:  CYANOCOBALAMIN  Take 1,000 mcg by mouth daily.     zolpidem 5 MG tablet  Commonly known as:  AMBIEN  Take one tablet by mouth once daily at bedtime for rest (control)        Review of Systems  Constitutional: Negative for fever, chills, diaphoresis, activity change, appetite change, fatigue and unexpected weight change.  HENT: Positive for hearing loss. Negative for congestion, ear discharge, ear pain and tinnitus.   Respiratory: Negative for cough, shortness of breath and wheezing.   Cardiovascular: Negative for chest pain, palpitations and leg swelling.  Gastrointestinal: Negative for abdominal pain, diarrhea, constipation and abdominal distention.       Excess gas  Genitourinary: Negative for dysuria and difficulty urinating.  Musculoskeletal: Positive for arthralgias and gait problem. Negative for myalgias, back pain and joint swelling.  Skin: Negative for rash and  wound.  Neurological: Positive for weakness. Negative for dizziness, tremors, seizures, syncope, facial asymmetry, speech difficulty, light-headedness, numbness and headaches.  Psychiatric/Behavioral: Negative for behavioral problems, confusion and agitation.       Mild memory loss    Immunization History  Administered Date(s) Administered  . Influenza Split 05/28/2014  . Influenza-Unspecified 06/10/2015  . Pneumococcal Conjugate-13 03/03/2014   Pertinent  Health Maintenance Due  Topic Date Due  .  PNA vac Low Risk Adult (2 of 2 - PPSV23) 03/04/2015  . INFLUENZA VACCINE  03/28/2016  . DEXA SCAN  Completed   Fall Risk  08/12/2015 07/15/2015 02/15/2015 01/07/2015 01/07/2015  Falls in the past year? Yes Yes Yes Yes Yes  Number falls in past yr: 1 1 1 1 1   Injury with Fall? No - - Yes -  Risk Factor Category  - - High Fall Risk High Fall Risk -  Risk for fall due to : History of fall(s);Impaired balance/gait History of fall(s) History of fall(s) History of fall(s) History of fall(s);Impaired balance/gait  Follow up Falls evaluation completed Falls evaluation completed - Falls evaluation completed Falls evaluation completed   Functional Status Survey:    Filed Vitals:   03/06/16 1124  BP: 142/83  Pulse: 80  Temp: 97.9 F (36.6 C)  Resp: 20  Weight: 101 lb 12.8 oz (46.176 kg)  SpO2: 98%   Body mass index is 18.04 kg/(m^2). Physical Exam  Constitutional: She is oriented to person, place, and time. No distress.  HENT:  Head: Normocephalic and atraumatic.  Right Ear: External ear normal.  Left Ear: External ear normal.  Nose: Nose normal.  Mouth/Throat: Oropharynx is clear and moist. No oropharyngeal exudate.  Neck: No JVD present.  Cardiovascular: Normal rate and regular rhythm.   No murmur heard. Pulmonary/Chest: Effort normal. No respiratory distress. She has wheezes (mild bilat).  Abdominal: Soft. Bowel sounds are normal. She exhibits no distension.  Musculoskeletal: She  exhibits no edema or tenderness.  Decreased ROM to the right shoulder with swelling noted  Neurological: She is alert and oriented to person, place, and time.  Skin: Skin is warm and dry. She is not diaphoretic.  Psychiatric: She has a normal mood and affect.    Labs reviewed:  Recent Labs  04/02/15 05/13/15 08/11/15  NA 136* 134* 137  K 3.7 3.4 4.1  BUN 19 13 17   CREATININE 0.6 0.6 0.7   No results for input(s): AST, ALT, ALKPHOS, BILITOT, PROT, ALBUMIN in the last 8760 hours.  Recent Labs  08/11/15  WBC 6.0  HGB 13.5  HCT 41  PLT 303   Lab Results  Component Value Date   TSH 4.26 11/17/2014   No results found for: HGBA1C No results found for: CHOL, HDL, LDLCALC, LDLDIRECT, TRIG, CHOLHDL  Significant Diagnostic Results in last 30 days:  No results found.  Assessment/Plan  1. Weakness Not noted on exam and is walking again today Check CBC/BMP routinely  2. Slow transit constipation Improved Continue miralax  3. Hearing loss, bilateral Using hearing aides, no cerumen impaction on exam  4. Loss of weight Improving Continue nutritional supplement and remeron  5. Insomnia Continue sleep hygiene, avoid prolonged naps Continue ambien and Scientist, physiological Communication: discussed with staff/resident  Labs/tests ordered:  CBC BMP  Cindi Carbon, [place] [phone]

## 2016-03-07 DIAGNOSIS — I10 Essential (primary) hypertension: Secondary | ICD-10-CM | POA: Diagnosis not present

## 2016-03-07 DIAGNOSIS — D500 Iron deficiency anemia secondary to blood loss (chronic): Secondary | ICD-10-CM | POA: Diagnosis not present

## 2016-03-07 LAB — BASIC METABOLIC PANEL
BUN: 18 mg/dL (ref 4–21)
Creatinine: 0.6 mg/dL (ref 0.5–1.1)
Glucose: 61 mg/dL
Potassium: 4.2 mmol/L (ref 3.4–5.3)
Sodium: 140 mmol/L (ref 137–147)

## 2016-03-07 LAB — CBC AND DIFFERENTIAL
HCT: 44 % (ref 36–46)
Hemoglobin: 13.5 g/dL (ref 12.0–16.0)
Platelets: 222 10*3/uL (ref 150–399)
WBC: 6.3 10^3/mL

## 2016-04-03 DIAGNOSIS — R0600 Dyspnea, unspecified: Secondary | ICD-10-CM | POA: Diagnosis not present

## 2016-04-03 DIAGNOSIS — F0390 Unspecified dementia without behavioral disturbance: Secondary | ICD-10-CM | POA: Diagnosis not present

## 2016-04-03 DIAGNOSIS — R2689 Other abnormalities of gait and mobility: Secondary | ICD-10-CM | POA: Diagnosis not present

## 2016-04-03 DIAGNOSIS — R5381 Other malaise: Secondary | ICD-10-CM | POA: Diagnosis not present

## 2016-04-03 DIAGNOSIS — M6259 Muscle wasting and atrophy, not elsewhere classified, multiple sites: Secondary | ICD-10-CM | POA: Diagnosis not present

## 2016-04-03 DIAGNOSIS — R54 Age-related physical debility: Secondary | ICD-10-CM | POA: Diagnosis not present

## 2016-04-03 DIAGNOSIS — R278 Other lack of coordination: Secondary | ICD-10-CM | POA: Diagnosis not present

## 2016-04-03 DIAGNOSIS — M19011 Primary osteoarthritis, right shoulder: Secondary | ICD-10-CM | POA: Diagnosis not present

## 2016-04-03 DIAGNOSIS — M6289 Other specified disorders of muscle: Secondary | ICD-10-CM | POA: Diagnosis not present

## 2016-04-03 DIAGNOSIS — R531 Weakness: Secondary | ICD-10-CM | POA: Diagnosis not present

## 2016-04-03 DIAGNOSIS — M25511 Pain in right shoulder: Secondary | ICD-10-CM | POA: Diagnosis not present

## 2016-04-04 ENCOUNTER — Encounter: Payer: Self-pay | Admitting: Internal Medicine

## 2016-04-04 ENCOUNTER — Non-Acute Institutional Stay (SKILLED_NURSING_FACILITY): Payer: Medicare Other | Admitting: Internal Medicine

## 2016-04-04 DIAGNOSIS — E43 Unspecified severe protein-calorie malnutrition: Secondary | ICD-10-CM

## 2016-04-04 DIAGNOSIS — G4700 Insomnia, unspecified: Secondary | ICD-10-CM | POA: Diagnosis not present

## 2016-04-04 DIAGNOSIS — F0390 Unspecified dementia without behavioral disturbance: Secondary | ICD-10-CM

## 2016-04-04 DIAGNOSIS — K5901 Slow transit constipation: Secondary | ICD-10-CM | POA: Diagnosis not present

## 2016-04-04 DIAGNOSIS — H3530 Unspecified macular degeneration: Secondary | ICD-10-CM

## 2016-04-04 DIAGNOSIS — I10 Essential (primary) hypertension: Secondary | ICD-10-CM

## 2016-04-04 DIAGNOSIS — Z23 Encounter for immunization: Secondary | ICD-10-CM | POA: Diagnosis not present

## 2016-04-04 NOTE — Progress Notes (Signed)
Patient ID: [name], female   DOB: [birth year], 82 y.o.   MRN: [id number]  Location:   [place] Room Number: [id number] of Service:  SNF (31) Provider:  Renne Platts L. Mariea Clonts, D.O., C.M.D.  Hollace Kinnier, DO  Patient Care Team: Gayland Curry, DO as PCP - General (Geriatric Medicine) [facility]  Extended Emergency Contact Information Primary Emergency Contact: Powell,Katherine  [place] Phone: [phone] Mobile Phone: [phone] Relation: Other Secondary Emergency Contact: Thatcher,Shirley Address: [address]  [place] of [place] Phone: [phone] Mobile Phone: [phone] Relation: Other  Code Status:  DNR Goals of care: Advanced Directive information Advanced Directives 04/04/2016  Does patient have an advance directive? Yes  Type of Advance Directive Out of facility DNR (pink MOST or yellow form);[place];Living will  Does patient want to make changes to advanced directive? -  Copy of advanced directive(s) in chart? Yes  Would patient like information on creating an advanced directive? -  Pre-existing out of facility DNR order (yellow form or pink MOST form) Yellow form placed in chart (order not valid for inpatient use)     Chief Complaint  Patient presents with  . Medical Management of Chronic Issues    routine visit    HPI:  Pt is a 82 y.o. female seen today for medical management of chronic diseases.   Constipation and excessive gas: long term issues,continues miralax and simethicone. Chews gum regularly and may be swallowing air.  Has regular BMs  Hearing loss: wears hearing aides bilaterally  Weight loss: conts on remeron for sleep/depression, appetite.  Wt had improved.  Insomnia: uses ambien and remeron, still not sleeping well.     Past Medical History:  Diagnosis Date  . Arthritis   . Carpal tunnel syndrome on left   . Diverticulosis   .  DJD (degenerative joint disease)   . Essential hypertension 06/29/2013   D/c 'd amlodipine  08/27/2014 due to low bp and relative tachycardia    . GERD (gastroesophageal reflux disease)   . Headache(784.0)   . High blood pressure    echo 09-normal  . Lower GI bleed 06/2013   required 2 units PRBC  . Macular degeneration syndrome   . Pleural effusion 08/07/2014  . Wears glasses   . Wears hearing aid    both  . Wears partial dentures    top and bottom   Past Surgical History:  Procedure Laterality Date  . APPENDECTOMY    . CARPAL TUNNEL RELEASE  2/12   rt  . CARPAL TUNNEL RELEASE  04/23/2012   Procedure: CARPAL TUNNEL RELEASE;  Surgeon: Wynonia Sours, MD;  Location: [facility];  Service: Orthopedics;  Laterality: Left;  . EYE SURGERY     cataractas  . HIP SURGERY     left  . JOINT REPLACEMENT  2006   rt total hip x2  . KNEE SURGERY    . THYROID CYST EXCISION    . TONSILLECTOMY      Allergies  Allergen Reactions  . Celebrex [Celecoxib] Itching  . Morphine And Related Nausea Only  . Vicodin [Hydrocodone-Acetaminophen] Itching  . Sulfa Antibiotics Rash      Medication List       Accurate as of 04/04/16 10:10 AM. Always use your most recent med list.          acetaminophen 500 MG tablet Commonly known as:  TYLENOL Take 1,000 mg by mouth 3 (  three) times daily as needed for mild pain or moderate pain.   CALCIUM 600 + D PO Take 1 tablet by mouth 2 (two) times daily.   cetirizine 10 MG tablet Commonly known as:  ZYRTEC Take 10 mg by mouth every morning.   diltiazem 120 MG tablet Commonly known as:  CARDIZEM Take 120 mg by mouth daily.   docusate sodium 100 MG capsule Commonly known as:  COLACE Take 100 mg by mouth 2 (two) times daily.   feeding supplement (ENSURE COMPLETE) Liqd Take 237 mLs by mouth 3 (three) times daily between meals.   fluticasone 27.5 MCG/SPRAY nasal spray Commonly known as:  VERAMYST Place 1 spray into the nose daily.     lisinopril 20 MG tablet Commonly known as:  PRINIVIL,ZESTRIL Take 20 mg by mouth daily.   mirtazapine 15 MG tablet Commonly known as:  REMERON Take 15 mg by mouth at bedtime.   polyethylene glycol packet Commonly known as:  MIRALAX / GLYCOLAX Take 17 g by mouth every other day.   ranitidine 150 MG capsule Commonly known as:  ZANTAC Take 150 mg by mouth daily as needed for heartburn (gerd).   simethicone 80 MG chewable tablet Commonly known as:  MYLICON Chew 0000000 mg by mouth 2 (two) times daily.   vitamin B-12 1000 MCG tablet Commonly known as:  CYANOCOBALAMIN Take 1,000 mcg by mouth daily.   zolpidem 5 MG tablet Commonly known as:  AMBIEN Take one tablet by mouth once daily at bedtime for rest (control)       Review of Systems  Constitutional: Positive for activity change. Negative for appetite change.       Walking less--refuses to with her caregiver sometimes now  HENT: Positive for hearing loss.   Eyes: Positive for visual disturbance.  Respiratory: Negative for shortness of breath.   Cardiovascular: Negative for chest pain and leg swelling.  Gastrointestinal: Negative for abdominal pain.  Genitourinary: Negative for dysuria.  Neurological: Positive for weakness.  Psychiatric/Behavioral: Positive for confusion and sleep disturbance. Negative for agitation. The patient is not nervous/anxious.        Nightmares    Immunization History  Administered Date(s) Administered  . Influenza Split 05/28/2014  . Influenza-Unspecified 06/10/2015  . Pneumococcal Conjugate-13 03/03/2014   Pertinent  Health Maintenance Due  Topic Date Due  . PNA vac Low Risk Adult (2 of 2 - PPSV23) 03/04/2015  . INFLUENZA VACCINE  03/28/2016  . DEXA SCAN  Completed   Fall Risk  08/12/2015 07/15/2015 02/15/2015 01/07/2015 01/07/2015  Falls in the past year? Yes Yes Yes Yes Yes  Number falls in past yr: 1 1 1 1 1   Injury with Fall? No - - Yes -  Risk Factor Category  - - High Fall Risk High  Fall Risk -  Risk for fall due to : History of fall(s);Impaired balance/gait History of fall(s) History of fall(s) History of fall(s) History of fall(s);Impaired balance/gait  Follow up Falls evaluation completed Falls evaluation completed - Falls evaluation completed Falls evaluation completed   Functional Status Survey:    Vitals:   04/04/16 0953  BP: (!) 150/96  Pulse: 87  Resp: 20  Temp: 98.1 F (36.7 C)  TempSrc: Oral  SpO2: 97%   There is no height or weight on file to calculate BMI. Physical Exam  Constitutional: No distress.  HENT:  Very HOH  Eyes:  Glasses, poor vision  Cardiovascular: Normal rate, regular rhythm, normal heart sounds and intact distal pulses.   Pulmonary/Chest:  Effort normal and breath sounds normal. No respiratory distress.  Abdominal: Soft. Bowel sounds are normal.  Musculoskeletal:  Seated in chair in her room, dozing  Neurological: She is alert.  Skin: Skin is warm and dry.    Labs reviewed:  Recent Labs  05/13/15 08/11/15 03/07/16 1100  NA 134* 137 140  K 3.4 4.1 4.2  BUN 13 17 18   CREATININE 0.6 0.7 0.6   No results for input(s): AST, ALT, ALKPHOS, BILITOT, PROT, ALBUMIN in the last 8760 hours.  Recent Labs  08/11/15 03/07/16 1100  WBC 6.0 6.3  HGB 13.5 13.5  HCT 41 44  PLT 303 222   Lab Results  Component Value Date   TSH 4.26 11/17/2014    Assessment/Plan 1. Dementia, without behavioral disturbance -not on meds, getting skilled care, not helped by her very poor vision and hearing -? Use amplifier  2. Essential hypertension -bp right around goal today of 150/90--pt is 96 and stable  3. Slow transit constipation -cont miralax and colace with benefit  4. Protein-calorie malnutrition, severe ([place]) -continues on ensure complete tid between meals and remeron for appetite with stabilization of weight  5. Insomnia -d/c remeron due to nightmares which are a potential side effect, d/c ambien as ineffective and not  recommended in geriatric patients per [facility] list (can worsen cognition, increase falls, etc), may try belsomra 10mg  at hs PRN insomnia (only it pt complains she is not sleeping)--of note, she was dozing off when I saw her today so she may need to be stimulated more in the daytime so she sleeps at night  6. Macular degeneration -not on any drops or vitamins for this at this time  7.  Need for 23 valent pneumonia vaccine--was ordered to be given  Family/ staff Communication: discussed with snf nurse  Labs/tests ordered:  No new

## 2016-04-05 DIAGNOSIS — R278 Other lack of coordination: Secondary | ICD-10-CM | POA: Diagnosis not present

## 2016-04-05 DIAGNOSIS — M6289 Other specified disorders of muscle: Secondary | ICD-10-CM | POA: Diagnosis not present

## 2016-04-05 DIAGNOSIS — M25511 Pain in right shoulder: Secondary | ICD-10-CM | POA: Diagnosis not present

## 2016-04-05 DIAGNOSIS — F0390 Unspecified dementia without behavioral disturbance: Secondary | ICD-10-CM | POA: Diagnosis not present

## 2016-04-05 DIAGNOSIS — M6259 Muscle wasting and atrophy, not elsewhere classified, multiple sites: Secondary | ICD-10-CM | POA: Diagnosis not present

## 2016-04-05 DIAGNOSIS — R2689 Other abnormalities of gait and mobility: Secondary | ICD-10-CM | POA: Diagnosis not present

## 2016-04-06 DIAGNOSIS — M25511 Pain in right shoulder: Secondary | ICD-10-CM | POA: Diagnosis not present

## 2016-04-06 DIAGNOSIS — F0390 Unspecified dementia without behavioral disturbance: Secondary | ICD-10-CM | POA: Diagnosis not present

## 2016-04-06 DIAGNOSIS — R278 Other lack of coordination: Secondary | ICD-10-CM | POA: Diagnosis not present

## 2016-04-06 DIAGNOSIS — R2689 Other abnormalities of gait and mobility: Secondary | ICD-10-CM | POA: Diagnosis not present

## 2016-04-06 DIAGNOSIS — M6259 Muscle wasting and atrophy, not elsewhere classified, multiple sites: Secondary | ICD-10-CM | POA: Diagnosis not present

## 2016-04-06 DIAGNOSIS — M6289 Other specified disorders of muscle: Secondary | ICD-10-CM | POA: Diagnosis not present

## 2016-04-07 DIAGNOSIS — R278 Other lack of coordination: Secondary | ICD-10-CM | POA: Diagnosis not present

## 2016-04-07 DIAGNOSIS — F0390 Unspecified dementia without behavioral disturbance: Secondary | ICD-10-CM | POA: Diagnosis not present

## 2016-04-07 DIAGNOSIS — M25511 Pain in right shoulder: Secondary | ICD-10-CM | POA: Diagnosis not present

## 2016-04-07 DIAGNOSIS — M6259 Muscle wasting and atrophy, not elsewhere classified, multiple sites: Secondary | ICD-10-CM | POA: Diagnosis not present

## 2016-04-07 DIAGNOSIS — R2689 Other abnormalities of gait and mobility: Secondary | ICD-10-CM | POA: Diagnosis not present

## 2016-04-07 DIAGNOSIS — M6289 Other specified disorders of muscle: Secondary | ICD-10-CM | POA: Diagnosis not present

## 2016-04-10 DIAGNOSIS — M6289 Other specified disorders of muscle: Secondary | ICD-10-CM | POA: Diagnosis not present

## 2016-04-10 DIAGNOSIS — F0390 Unspecified dementia without behavioral disturbance: Secondary | ICD-10-CM | POA: Diagnosis not present

## 2016-04-10 DIAGNOSIS — R2689 Other abnormalities of gait and mobility: Secondary | ICD-10-CM | POA: Diagnosis not present

## 2016-04-10 DIAGNOSIS — R278 Other lack of coordination: Secondary | ICD-10-CM | POA: Diagnosis not present

## 2016-04-10 DIAGNOSIS — M6259 Muscle wasting and atrophy, not elsewhere classified, multiple sites: Secondary | ICD-10-CM | POA: Diagnosis not present

## 2016-04-10 DIAGNOSIS — M25511 Pain in right shoulder: Secondary | ICD-10-CM | POA: Diagnosis not present

## 2016-04-11 DIAGNOSIS — M6289 Other specified disorders of muscle: Secondary | ICD-10-CM | POA: Diagnosis not present

## 2016-04-11 DIAGNOSIS — M25511 Pain in right shoulder: Secondary | ICD-10-CM | POA: Diagnosis not present

## 2016-04-11 DIAGNOSIS — R2689 Other abnormalities of gait and mobility: Secondary | ICD-10-CM | POA: Diagnosis not present

## 2016-04-11 DIAGNOSIS — R278 Other lack of coordination: Secondary | ICD-10-CM | POA: Diagnosis not present

## 2016-04-11 DIAGNOSIS — F0390 Unspecified dementia without behavioral disturbance: Secondary | ICD-10-CM | POA: Diagnosis not present

## 2016-04-11 DIAGNOSIS — M6259 Muscle wasting and atrophy, not elsewhere classified, multiple sites: Secondary | ICD-10-CM | POA: Diagnosis not present

## 2016-04-12 DIAGNOSIS — R278 Other lack of coordination: Secondary | ICD-10-CM | POA: Diagnosis not present

## 2016-04-12 DIAGNOSIS — M6289 Other specified disorders of muscle: Secondary | ICD-10-CM | POA: Diagnosis not present

## 2016-04-12 DIAGNOSIS — M6259 Muscle wasting and atrophy, not elsewhere classified, multiple sites: Secondary | ICD-10-CM | POA: Diagnosis not present

## 2016-04-12 DIAGNOSIS — F0390 Unspecified dementia without behavioral disturbance: Secondary | ICD-10-CM | POA: Diagnosis not present

## 2016-04-12 DIAGNOSIS — M25511 Pain in right shoulder: Secondary | ICD-10-CM | POA: Diagnosis not present

## 2016-04-12 DIAGNOSIS — R2689 Other abnormalities of gait and mobility: Secondary | ICD-10-CM | POA: Diagnosis not present

## 2016-04-13 DIAGNOSIS — F0390 Unspecified dementia without behavioral disturbance: Secondary | ICD-10-CM | POA: Diagnosis not present

## 2016-04-13 DIAGNOSIS — M6259 Muscle wasting and atrophy, not elsewhere classified, multiple sites: Secondary | ICD-10-CM | POA: Diagnosis not present

## 2016-04-13 DIAGNOSIS — R2689 Other abnormalities of gait and mobility: Secondary | ICD-10-CM | POA: Diagnosis not present

## 2016-04-13 DIAGNOSIS — M6289 Other specified disorders of muscle: Secondary | ICD-10-CM | POA: Diagnosis not present

## 2016-04-13 DIAGNOSIS — R278 Other lack of coordination: Secondary | ICD-10-CM | POA: Diagnosis not present

## 2016-04-13 DIAGNOSIS — M25511 Pain in right shoulder: Secondary | ICD-10-CM | POA: Diagnosis not present

## 2016-04-14 ENCOUNTER — Telehealth: Payer: Self-pay

## 2016-04-14 DIAGNOSIS — R2689 Other abnormalities of gait and mobility: Secondary | ICD-10-CM | POA: Diagnosis not present

## 2016-04-14 DIAGNOSIS — R278 Other lack of coordination: Secondary | ICD-10-CM | POA: Diagnosis not present

## 2016-04-14 DIAGNOSIS — M6259 Muscle wasting and atrophy, not elsewhere classified, multiple sites: Secondary | ICD-10-CM | POA: Diagnosis not present

## 2016-04-14 DIAGNOSIS — F0390 Unspecified dementia without behavioral disturbance: Secondary | ICD-10-CM | POA: Diagnosis not present

## 2016-04-14 DIAGNOSIS — M6289 Other specified disorders of muscle: Secondary | ICD-10-CM | POA: Diagnosis not present

## 2016-04-14 DIAGNOSIS — M25511 Pain in right shoulder: Secondary | ICD-10-CM | POA: Diagnosis not present

## 2016-04-14 NOTE — Telephone Encounter (Signed)
Patient c/o severe right shoulder pain, onset today. Patient needs something other than tylenol. 152/85 B/P all other vitals stable.  Please advise

## 2016-04-15 NOTE — Telephone Encounter (Signed)
I'd suggest using ice on the shoulder for 20 mins three to four times per day for about 3 days.  She can also apply topical rubs like aspercreme to the shoulder.  If those do not do the trick, tramadol 50mg  po tid prn severe shoulder pain.  I have questions though:  Is her range of motion affected?  How did she hurt it?

## 2016-04-17 ENCOUNTER — Other Ambulatory Visit: Payer: Self-pay | Admitting: *Deleted

## 2016-04-17 DIAGNOSIS — R2689 Other abnormalities of gait and mobility: Secondary | ICD-10-CM | POA: Diagnosis not present

## 2016-04-17 DIAGNOSIS — M25511 Pain in right shoulder: Secondary | ICD-10-CM | POA: Diagnosis not present

## 2016-04-17 DIAGNOSIS — M6289 Other specified disorders of muscle: Secondary | ICD-10-CM | POA: Diagnosis not present

## 2016-04-17 DIAGNOSIS — M6259 Muscle wasting and atrophy, not elsewhere classified, multiple sites: Secondary | ICD-10-CM | POA: Diagnosis not present

## 2016-04-17 DIAGNOSIS — F0390 Unspecified dementia without behavioral disturbance: Secondary | ICD-10-CM | POA: Diagnosis not present

## 2016-04-17 DIAGNOSIS — R278 Other lack of coordination: Secondary | ICD-10-CM | POA: Diagnosis not present

## 2016-04-17 MED ORDER — SUVOREXANT 10 MG PO TABS: 10.0000 mg | ORAL_TABLET | Freq: Every evening | ORAL | 0 refills | Status: DC | PRN

## 2016-04-17 NOTE — Telephone Encounter (Signed)
[facility] #[phone] Fax: [phone]  

## 2016-04-17 NOTE — Telephone Encounter (Signed)
Spoke with [name], patient used Aspercream over the weekend. I will fax phone note to [facility] at [phone].

## 2016-04-18 DIAGNOSIS — M6289 Other specified disorders of muscle: Secondary | ICD-10-CM | POA: Diagnosis not present

## 2016-04-18 DIAGNOSIS — R278 Other lack of coordination: Secondary | ICD-10-CM | POA: Diagnosis not present

## 2016-04-18 DIAGNOSIS — F0390 Unspecified dementia without behavioral disturbance: Secondary | ICD-10-CM | POA: Diagnosis not present

## 2016-04-18 DIAGNOSIS — M6259 Muscle wasting and atrophy, not elsewhere classified, multiple sites: Secondary | ICD-10-CM | POA: Diagnosis not present

## 2016-04-18 DIAGNOSIS — R2689 Other abnormalities of gait and mobility: Secondary | ICD-10-CM | POA: Diagnosis not present

## 2016-04-18 DIAGNOSIS — M25511 Pain in right shoulder: Secondary | ICD-10-CM | POA: Diagnosis not present

## 2016-04-19 DIAGNOSIS — F0390 Unspecified dementia without behavioral disturbance: Secondary | ICD-10-CM | POA: Diagnosis not present

## 2016-04-19 DIAGNOSIS — R278 Other lack of coordination: Secondary | ICD-10-CM | POA: Diagnosis not present

## 2016-04-19 DIAGNOSIS — M6289 Other specified disorders of muscle: Secondary | ICD-10-CM | POA: Diagnosis not present

## 2016-04-19 DIAGNOSIS — R2689 Other abnormalities of gait and mobility: Secondary | ICD-10-CM | POA: Diagnosis not present

## 2016-04-19 DIAGNOSIS — M6259 Muscle wasting and atrophy, not elsewhere classified, multiple sites: Secondary | ICD-10-CM | POA: Diagnosis not present

## 2016-04-19 DIAGNOSIS — M25511 Pain in right shoulder: Secondary | ICD-10-CM | POA: Diagnosis not present

## 2016-04-20 DIAGNOSIS — R278 Other lack of coordination: Secondary | ICD-10-CM | POA: Diagnosis not present

## 2016-04-20 DIAGNOSIS — R2689 Other abnormalities of gait and mobility: Secondary | ICD-10-CM | POA: Diagnosis not present

## 2016-04-20 DIAGNOSIS — M6289 Other specified disorders of muscle: Secondary | ICD-10-CM | POA: Diagnosis not present

## 2016-04-20 DIAGNOSIS — M6259 Muscle wasting and atrophy, not elsewhere classified, multiple sites: Secondary | ICD-10-CM | POA: Diagnosis not present

## 2016-04-20 DIAGNOSIS — M25511 Pain in right shoulder: Secondary | ICD-10-CM | POA: Diagnosis not present

## 2016-04-20 DIAGNOSIS — F0390 Unspecified dementia without behavioral disturbance: Secondary | ICD-10-CM | POA: Diagnosis not present

## 2016-04-21 DIAGNOSIS — F0390 Unspecified dementia without behavioral disturbance: Secondary | ICD-10-CM | POA: Diagnosis not present

## 2016-04-21 DIAGNOSIS — M6259 Muscle wasting and atrophy, not elsewhere classified, multiple sites: Secondary | ICD-10-CM | POA: Diagnosis not present

## 2016-04-21 DIAGNOSIS — R2689 Other abnormalities of gait and mobility: Secondary | ICD-10-CM | POA: Diagnosis not present

## 2016-04-21 DIAGNOSIS — R278 Other lack of coordination: Secondary | ICD-10-CM | POA: Diagnosis not present

## 2016-04-21 DIAGNOSIS — M6289 Other specified disorders of muscle: Secondary | ICD-10-CM | POA: Diagnosis not present

## 2016-04-21 DIAGNOSIS — M25511 Pain in right shoulder: Secondary | ICD-10-CM | POA: Diagnosis not present

## 2016-04-24 DIAGNOSIS — M6289 Other specified disorders of muscle: Secondary | ICD-10-CM | POA: Diagnosis not present

## 2016-04-24 DIAGNOSIS — M25511 Pain in right shoulder: Secondary | ICD-10-CM | POA: Diagnosis not present

## 2016-04-24 DIAGNOSIS — M6259 Muscle wasting and atrophy, not elsewhere classified, multiple sites: Secondary | ICD-10-CM | POA: Diagnosis not present

## 2016-04-24 DIAGNOSIS — F0390 Unspecified dementia without behavioral disturbance: Secondary | ICD-10-CM | POA: Diagnosis not present

## 2016-04-24 DIAGNOSIS — R278 Other lack of coordination: Secondary | ICD-10-CM | POA: Diagnosis not present

## 2016-04-24 DIAGNOSIS — R2689 Other abnormalities of gait and mobility: Secondary | ICD-10-CM | POA: Diagnosis not present

## 2016-04-26 DIAGNOSIS — M6289 Other specified disorders of muscle: Secondary | ICD-10-CM | POA: Diagnosis not present

## 2016-04-26 DIAGNOSIS — R2689 Other abnormalities of gait and mobility: Secondary | ICD-10-CM | POA: Diagnosis not present

## 2016-04-26 DIAGNOSIS — R278 Other lack of coordination: Secondary | ICD-10-CM | POA: Diagnosis not present

## 2016-04-26 DIAGNOSIS — M6259 Muscle wasting and atrophy, not elsewhere classified, multiple sites: Secondary | ICD-10-CM | POA: Diagnosis not present

## 2016-04-26 DIAGNOSIS — M25511 Pain in right shoulder: Secondary | ICD-10-CM | POA: Diagnosis not present

## 2016-04-26 DIAGNOSIS — F0390 Unspecified dementia without behavioral disturbance: Secondary | ICD-10-CM | POA: Diagnosis not present

## 2016-04-27 DIAGNOSIS — M25511 Pain in right shoulder: Secondary | ICD-10-CM | POA: Diagnosis not present

## 2016-04-27 DIAGNOSIS — M6289 Other specified disorders of muscle: Secondary | ICD-10-CM | POA: Diagnosis not present

## 2016-04-27 DIAGNOSIS — R2689 Other abnormalities of gait and mobility: Secondary | ICD-10-CM | POA: Diagnosis not present

## 2016-04-27 DIAGNOSIS — M6259 Muscle wasting and atrophy, not elsewhere classified, multiple sites: Secondary | ICD-10-CM | POA: Diagnosis not present

## 2016-04-27 DIAGNOSIS — F0390 Unspecified dementia without behavioral disturbance: Secondary | ICD-10-CM | POA: Diagnosis not present

## 2016-04-27 DIAGNOSIS — R278 Other lack of coordination: Secondary | ICD-10-CM | POA: Diagnosis not present

## 2016-04-28 DIAGNOSIS — M6289 Other specified disorders of muscle: Secondary | ICD-10-CM | POA: Diagnosis not present

## 2016-04-28 DIAGNOSIS — M6259 Muscle wasting and atrophy, not elsewhere classified, multiple sites: Secondary | ICD-10-CM | POA: Diagnosis not present

## 2016-04-28 DIAGNOSIS — F0390 Unspecified dementia without behavioral disturbance: Secondary | ICD-10-CM | POA: Diagnosis not present

## 2016-04-28 DIAGNOSIS — M19011 Primary osteoarthritis, right shoulder: Secondary | ICD-10-CM | POA: Diagnosis not present

## 2016-04-28 DIAGNOSIS — R278 Other lack of coordination: Secondary | ICD-10-CM | POA: Diagnosis not present

## 2016-04-28 DIAGNOSIS — M25511 Pain in right shoulder: Secondary | ICD-10-CM | POA: Diagnosis not present

## 2016-04-28 DIAGNOSIS — R0600 Dyspnea, unspecified: Secondary | ICD-10-CM | POA: Diagnosis not present

## 2016-04-28 DIAGNOSIS — R2689 Other abnormalities of gait and mobility: Secondary | ICD-10-CM | POA: Diagnosis not present

## 2016-04-28 DIAGNOSIS — R54 Age-related physical debility: Secondary | ICD-10-CM | POA: Diagnosis not present

## 2016-04-28 DIAGNOSIS — R5381 Other malaise: Secondary | ICD-10-CM | POA: Diagnosis not present

## 2016-04-28 DIAGNOSIS — R531 Weakness: Secondary | ICD-10-CM | POA: Diagnosis not present

## 2016-05-01 DIAGNOSIS — F0390 Unspecified dementia without behavioral disturbance: Secondary | ICD-10-CM | POA: Diagnosis not present

## 2016-05-01 DIAGNOSIS — R278 Other lack of coordination: Secondary | ICD-10-CM | POA: Diagnosis not present

## 2016-05-01 DIAGNOSIS — M6289 Other specified disorders of muscle: Secondary | ICD-10-CM | POA: Diagnosis not present

## 2016-05-01 DIAGNOSIS — M25511 Pain in right shoulder: Secondary | ICD-10-CM | POA: Diagnosis not present

## 2016-05-01 DIAGNOSIS — M6259 Muscle wasting and atrophy, not elsewhere classified, multiple sites: Secondary | ICD-10-CM | POA: Diagnosis not present

## 2016-05-01 DIAGNOSIS — R2689 Other abnormalities of gait and mobility: Secondary | ICD-10-CM | POA: Diagnosis not present

## 2016-05-02 DIAGNOSIS — R278 Other lack of coordination: Secondary | ICD-10-CM | POA: Diagnosis not present

## 2016-05-02 DIAGNOSIS — M25511 Pain in right shoulder: Secondary | ICD-10-CM | POA: Diagnosis not present

## 2016-05-02 DIAGNOSIS — M6259 Muscle wasting and atrophy, not elsewhere classified, multiple sites: Secondary | ICD-10-CM | POA: Diagnosis not present

## 2016-05-02 DIAGNOSIS — M6289 Other specified disorders of muscle: Secondary | ICD-10-CM | POA: Diagnosis not present

## 2016-05-02 DIAGNOSIS — F0390 Unspecified dementia without behavioral disturbance: Secondary | ICD-10-CM | POA: Diagnosis not present

## 2016-05-02 DIAGNOSIS — R2689 Other abnormalities of gait and mobility: Secondary | ICD-10-CM | POA: Diagnosis not present

## 2016-05-03 DIAGNOSIS — R2689 Other abnormalities of gait and mobility: Secondary | ICD-10-CM | POA: Diagnosis not present

## 2016-05-03 DIAGNOSIS — M6289 Other specified disorders of muscle: Secondary | ICD-10-CM | POA: Diagnosis not present

## 2016-05-03 DIAGNOSIS — R278 Other lack of coordination: Secondary | ICD-10-CM | POA: Diagnosis not present

## 2016-05-03 DIAGNOSIS — M25511 Pain in right shoulder: Secondary | ICD-10-CM | POA: Diagnosis not present

## 2016-05-03 DIAGNOSIS — M6259 Muscle wasting and atrophy, not elsewhere classified, multiple sites: Secondary | ICD-10-CM | POA: Diagnosis not present

## 2016-05-03 DIAGNOSIS — F0390 Unspecified dementia without behavioral disturbance: Secondary | ICD-10-CM | POA: Diagnosis not present

## 2016-05-04 DIAGNOSIS — M6289 Other specified disorders of muscle: Secondary | ICD-10-CM | POA: Diagnosis not present

## 2016-05-04 DIAGNOSIS — R2689 Other abnormalities of gait and mobility: Secondary | ICD-10-CM | POA: Diagnosis not present

## 2016-05-04 DIAGNOSIS — F0390 Unspecified dementia without behavioral disturbance: Secondary | ICD-10-CM | POA: Diagnosis not present

## 2016-05-04 DIAGNOSIS — M25511 Pain in right shoulder: Secondary | ICD-10-CM | POA: Diagnosis not present

## 2016-05-04 DIAGNOSIS — R278 Other lack of coordination: Secondary | ICD-10-CM | POA: Diagnosis not present

## 2016-05-04 DIAGNOSIS — M6259 Muscle wasting and atrophy, not elsewhere classified, multiple sites: Secondary | ICD-10-CM | POA: Diagnosis not present

## 2016-05-05 DIAGNOSIS — R278 Other lack of coordination: Secondary | ICD-10-CM | POA: Diagnosis not present

## 2016-05-05 DIAGNOSIS — M25511 Pain in right shoulder: Secondary | ICD-10-CM | POA: Diagnosis not present

## 2016-05-05 DIAGNOSIS — F0390 Unspecified dementia without behavioral disturbance: Secondary | ICD-10-CM | POA: Diagnosis not present

## 2016-05-05 DIAGNOSIS — R2689 Other abnormalities of gait and mobility: Secondary | ICD-10-CM | POA: Diagnosis not present

## 2016-05-05 DIAGNOSIS — M6259 Muscle wasting and atrophy, not elsewhere classified, multiple sites: Secondary | ICD-10-CM | POA: Diagnosis not present

## 2016-05-05 DIAGNOSIS — M6289 Other specified disorders of muscle: Secondary | ICD-10-CM | POA: Diagnosis not present

## 2016-05-08 DIAGNOSIS — M6289 Other specified disorders of muscle: Secondary | ICD-10-CM | POA: Diagnosis not present

## 2016-05-08 DIAGNOSIS — F0390 Unspecified dementia without behavioral disturbance: Secondary | ICD-10-CM | POA: Diagnosis not present

## 2016-05-08 DIAGNOSIS — R2689 Other abnormalities of gait and mobility: Secondary | ICD-10-CM | POA: Diagnosis not present

## 2016-05-08 DIAGNOSIS — M6259 Muscle wasting and atrophy, not elsewhere classified, multiple sites: Secondary | ICD-10-CM | POA: Diagnosis not present

## 2016-05-08 DIAGNOSIS — M25511 Pain in right shoulder: Secondary | ICD-10-CM | POA: Diagnosis not present

## 2016-05-08 DIAGNOSIS — R278 Other lack of coordination: Secondary | ICD-10-CM | POA: Diagnosis not present

## 2016-05-10 DIAGNOSIS — R278 Other lack of coordination: Secondary | ICD-10-CM | POA: Diagnosis not present

## 2016-05-10 DIAGNOSIS — M25511 Pain in right shoulder: Secondary | ICD-10-CM | POA: Diagnosis not present

## 2016-05-10 DIAGNOSIS — M6259 Muscle wasting and atrophy, not elsewhere classified, multiple sites: Secondary | ICD-10-CM | POA: Diagnosis not present

## 2016-05-10 DIAGNOSIS — M6289 Other specified disorders of muscle: Secondary | ICD-10-CM | POA: Diagnosis not present

## 2016-05-10 DIAGNOSIS — R2689 Other abnormalities of gait and mobility: Secondary | ICD-10-CM | POA: Diagnosis not present

## 2016-05-10 DIAGNOSIS — F0390 Unspecified dementia without behavioral disturbance: Secondary | ICD-10-CM | POA: Diagnosis not present

## 2016-05-12 DIAGNOSIS — M6259 Muscle wasting and atrophy, not elsewhere classified, multiple sites: Secondary | ICD-10-CM | POA: Diagnosis not present

## 2016-05-12 DIAGNOSIS — R278 Other lack of coordination: Secondary | ICD-10-CM | POA: Diagnosis not present

## 2016-05-12 DIAGNOSIS — M25511 Pain in right shoulder: Secondary | ICD-10-CM | POA: Diagnosis not present

## 2016-05-12 DIAGNOSIS — F0390 Unspecified dementia without behavioral disturbance: Secondary | ICD-10-CM | POA: Diagnosis not present

## 2016-05-12 DIAGNOSIS — R2689 Other abnormalities of gait and mobility: Secondary | ICD-10-CM | POA: Diagnosis not present

## 2016-05-12 DIAGNOSIS — M6289 Other specified disorders of muscle: Secondary | ICD-10-CM | POA: Diagnosis not present

## 2016-05-15 ENCOUNTER — Non-Acute Institutional Stay (SKILLED_NURSING_FACILITY): Payer: Medicare Other | Admitting: Adult Health

## 2016-05-15 ENCOUNTER — Encounter: Payer: Self-pay | Admitting: Adult Health

## 2016-05-15 DIAGNOSIS — F0390 Unspecified dementia without behavioral disturbance: Secondary | ICD-10-CM | POA: Diagnosis not present

## 2016-05-15 DIAGNOSIS — R634 Abnormal weight loss: Secondary | ICD-10-CM

## 2016-05-15 DIAGNOSIS — K5901 Slow transit constipation: Secondary | ICD-10-CM | POA: Diagnosis not present

## 2016-05-15 DIAGNOSIS — R278 Other lack of coordination: Secondary | ICD-10-CM | POA: Diagnosis not present

## 2016-05-15 DIAGNOSIS — R2689 Other abnormalities of gait and mobility: Secondary | ICD-10-CM | POA: Diagnosis not present

## 2016-05-15 DIAGNOSIS — G4700 Insomnia, unspecified: Secondary | ICD-10-CM

## 2016-05-15 DIAGNOSIS — I10 Essential (primary) hypertension: Secondary | ICD-10-CM

## 2016-05-15 DIAGNOSIS — M6289 Other specified disorders of muscle: Secondary | ICD-10-CM | POA: Diagnosis not present

## 2016-05-15 DIAGNOSIS — M25511 Pain in right shoulder: Secondary | ICD-10-CM | POA: Diagnosis not present

## 2016-05-15 DIAGNOSIS — M19011 Primary osteoarthritis, right shoulder: Secondary | ICD-10-CM

## 2016-05-15 DIAGNOSIS — M6259 Muscle wasting and atrophy, not elsewhere classified, multiple sites: Secondary | ICD-10-CM | POA: Diagnosis not present

## 2016-05-15 NOTE — Progress Notes (Signed)
Patient ID: [name], female   DOB: 1917-04-07, 82 y.o.   MRN: [id number]   Location:   [place]:  SNF (31) Provider:   Cindi Carbon, [place] [phone]   REED, Jonelle Sidle, DO  Patient Care Team: Gayland Curry, DO as PCP - General (Geriatric Medicine) [facility]  Extended Emergency Contact Information Primary Emergency Contact: Powell,Katherine  [place] Phone: [phone] Mobile Phone: [phone] Relation: Other Secondary Emergency Contact: Thatcher,Shirley Address: [address]  [place] of [place] Phone: [phone] Mobile Phone: [phone] Relation: Other  Code Status:  DNR Goals of care: Advanced Directive information Advanced Directives 04/04/2016  Does patient have an advance directive? Yes  Type of Advance Directive Out of facility DNR (pink MOST or yellow form);[place];Living will  Does patient want to make changes to advanced directive? -  Copy of advanced directive(s) in chart? Yes  Would patient like information on creating an advanced directive? -  Pre-existing out of facility DNR order (yellow form or pink MOST form) Yellow form placed in chart (order not valid for inpatient use)     Chief Complaint  Patient presents with  . Medical Management of Chronic Issues    HPI:  Pt is a 82 y.o. female seen today for medical management of chronic diseases.   Resides in skilled care at [facility] due to age, debility, and mild memory loss. She has a private caregiver with her each morning. She has become weaker over time and now uses a stand up lift to for transfers.  Ambien was d/c'd due to inefficacy.  Remeron was discontinued due to nightmares and Belsomra was started. Staff has charted that she is sleeping well at night.  Appetite is overall small, weight down 2 lbs from last month.  BP controlled 145/88, no  edema sob or cp. Currently on lisinopril 20 mg qd and Cardizem 120 mg qd Uses Aspercreme to right shoulder for pain, mild improvement noted. Has confusion with Ultram   Past Medical History:  Diagnosis Date  . Arthritis   . Carpal tunnel syndrome on left   . Diverticulosis   . DJD (degenerative joint disease)   . Essential hypertension 06/29/2013   D/c 'd amlodipine  08/27/2014 due to low bp and relative tachycardia    . GERD (gastroesophageal reflux disease)   . Headache(784.0)   . High blood pressure    echo 09-normal  . Lower GI bleed 06/2013   required 2 units PRBC  . Macular degeneration syndrome   . Pleural effusion 08/07/2014  . Wears glasses   . Wears hearing aid    both  . Wears partial dentures    top and bottom   Past Surgical History:  Procedure Laterality Date  . APPENDECTOMY    . CARPAL TUNNEL RELEASE  2/12   rt  . CARPAL TUNNEL RELEASE  04/23/2012   Procedure: CARPAL TUNNEL RELEASE;  Surgeon: Wynonia Sours, MD;  Location: [place];  Service: Orthopedics;  Laterality: Left;  . EYE SURGERY     cataractas  . HIP SURGERY     left  . JOINT REPLACEMENT  2006   rt total hip x2  . KNEE SURGERY    . THYROID CYST EXCISION    . TONSILLECTOMY      Allergies  Allergen Reactions  . Celebrex [Celecoxib] Itching  . Morphine And Related Nausea Only  .  Vicodin [Hydrocodone-Acetaminophen] Itching  . Sulfa Antibiotics Rash      Medication List       Accurate as of 05/15/16  2:09 PM. Always use your most recent med list.          acetaminophen 500 MG tablet Commonly known as:  TYLENOL Take 1,000 mg by mouth 3 (three) times daily.   CALCIUM 600 + D PO Take 1 tablet by mouth 2 (two) times daily.   cetirizine 10 MG tablet Commonly known as:  ZYRTEC Take 10 mg by mouth every morning.   diltiazem 120 MG tablet Commonly known as:  CARDIZEM Take 120 mg by mouth daily.   docusate sodium 100 MG capsule Commonly known as:  COLACE Take 100 mg  by mouth 2 (two) times daily.   feeding supplement (ENSURE COMPLETE) Liqd Take 237 mLs by mouth 3 (three) times daily between meals.   fluticasone 27.5 MCG/SPRAY nasal spray Commonly known as:  VERAMYST Place 1 spray into the nose daily.   lisinopril 20 MG tablet Commonly known as:  PRINIVIL,ZESTRIL Take 20 mg by mouth daily.   polyethylene glycol packet Commonly known as:  MIRALAX / GLYCOLAX Take 17 g by mouth every other day.   ranitidine 150 MG capsule Commonly known as:  ZANTAC Take 150 mg by mouth daily as needed for heartburn (gerd).   simethicone 80 MG chewable tablet Commonly known as:  MYLICON Chew 0000000 mg by mouth 2 (two) times daily.   Suvorexant 10 MG Tabs Commonly known as:  BELSOMRA Take 10 mg by mouth at bedtime as needed.   trolamine salicylate 10 % cream Commonly known as:  ASPERCREME Apply 1 application topically 4 (four) times daily as needed for muscle pain.   vitamin B-12 1000 MCG tablet Commonly known as:  CYANOCOBALAMIN Take 1,000 mcg by mouth daily.       Review of Systems  Constitutional: Negative for activity change, appetite change, chills, diaphoresis, fatigue, fever and unexpected weight change.  HENT: Positive for hearing loss. Negative for congestion, ear discharge, ear pain and tinnitus.   Respiratory: Negative for cough, shortness of breath and wheezing.   Cardiovascular: Negative for chest pain, palpitations and leg swelling.  Gastrointestinal: Negative for abdominal distention, abdominal pain, constipation and diarrhea.       Excess gas  Genitourinary: Negative for difficulty urinating and dysuria.  Musculoskeletal: Positive for arthralgias and gait problem. Negative for back pain, joint swelling and myalgias.  Skin: Negative for rash and wound.  Neurological: Positive for weakness. Negative for dizziness, tremors, seizures, syncope, facial asymmetry, speech difficulty, light-headedness, numbness and headaches.    Psychiatric/Behavioral: Negative for agitation, behavioral problems and confusion.       Mild memory loss    Immunization History  Administered Date(s) Administered  . Influenza Split 05/28/2014  . Influenza-Unspecified 06/10/2015  . Pneumococcal Conjugate-13 03/03/2014   Pertinent  Health Maintenance Due  Topic Date Due  . PNA vac Low Risk Adult (2 of 2 - PPSV23) 03/04/2015  . INFLUENZA VACCINE  03/28/2016  . DEXA SCAN  Completed   Fall Risk  08/12/2015 07/15/2015 02/15/2015 01/07/2015 01/07/2015  Falls in the past year? Yes Yes Yes Yes Yes  Number falls in past yr: 1 1 1 1 1   Injury with Fall? No - - Yes -  Risk Factor Category  - - High Fall Risk High Fall Risk -  Risk for fall due to : History of fall(s);Impaired balance/gait History of fall(s) History of fall(s) History of fall(s)  History of fall(s);Impaired balance/gait  Follow up Falls evaluation completed Falls evaluation completed - Falls evaluation completed Falls evaluation completed   Functional Status Survey:   Wt Readings from Last 3 Encounters:  05/15/16 99 lb 9.6 oz (45.2 kg)  03/06/16 101 lb 12.8 oz (46.2 kg)  01/28/16 102 lb 12.8 oz (46.6 kg)   Vitals:   05/15/16 1344  BP: (!) 145/88  Pulse: 74  Resp: 20  Temp: 98.9 F (37.2 C)  SpO2: 96%  Weight: 99 lb 9.6 oz (45.2 kg)   Body mass index is 17.64 kg/m. Physical Exam  Constitutional: No distress.  HENT:  Head: Normocephalic and atraumatic.  Right Ear: External ear normal.  Left Ear: External ear normal.  Nose: Nose normal.  Mouth/Throat: Oropharynx is clear and moist. No oropharyngeal exudate.  Neck: No JVD present.  Cardiovascular: Normal rate and regular rhythm.   No murmur heard. Pulmonary/Chest: Effort normal. No respiratory distress. She has wheezes (mild bilat).  Abdominal: Soft. Bowel sounds are normal. She exhibits no distension.  Musculoskeletal: She exhibits no edema or tenderness.  Decreased ROM to the right shoulder  Neurological:  She is alert.  Oriented to self, situation, place, not time  Skin: Skin is warm and dry. She is not diaphoretic.  Psychiatric: She has a normal mood and affect.  Nursing note and vitals reviewed.   Labs reviewed:  Recent Labs  08/11/15 03/07/16 1100  NA 137 140  K 4.1 4.2  BUN 17 18  CREATININE 0.7 0.6   No results for input(s): AST, ALT, ALKPHOS, BILITOT, PROT, ALBUMIN in the last 8760 hours.  Recent Labs  08/11/15 03/07/16 1100  WBC 6.0 6.3  HGB 13.5 13.5  HCT 41 44  PLT 303 222   Lab Results  Component Value Date   TSH 4.26 11/17/2014   No results found for: HGBA1C No results found for: CHOL, HDL, LDLCALC, LDLDIRECT, TRIG, CHOLHDL  Significant Diagnostic Results in last 30 days:  No results found.  Assessment/Plan 1. Insomnia Improved with Belsomra 10 mg qhs prn  2. Loss of weight Monitor weight since Remeron was discontinued Continue Ensure daily TID  3. Primary osteoarthritis of right shoulder Continue aspercreme QID prn  4. Slow transit constipation Controlled Continue Colace and miralax  5. Dementia, without behavioral disturbance Progressive memory loss but would not benefit from meds at this point  6. Essential hypertension Controlled Continue lisinopril and Cardizem    Family/ staff Communication: discussed with staff/resident  Labs/tests ordered: NA  Cindi Carbon, ANP [facility] [phone]

## 2016-05-17 DIAGNOSIS — F0390 Unspecified dementia without behavioral disturbance: Secondary | ICD-10-CM | POA: Diagnosis not present

## 2016-05-17 DIAGNOSIS — R278 Other lack of coordination: Secondary | ICD-10-CM | POA: Diagnosis not present

## 2016-05-17 DIAGNOSIS — M6289 Other specified disorders of muscle: Secondary | ICD-10-CM | POA: Diagnosis not present

## 2016-05-17 DIAGNOSIS — R2689 Other abnormalities of gait and mobility: Secondary | ICD-10-CM | POA: Diagnosis not present

## 2016-05-17 DIAGNOSIS — M25511 Pain in right shoulder: Secondary | ICD-10-CM | POA: Diagnosis not present

## 2016-05-17 DIAGNOSIS — M6259 Muscle wasting and atrophy, not elsewhere classified, multiple sites: Secondary | ICD-10-CM | POA: Diagnosis not present

## 2016-05-19 DIAGNOSIS — M6289 Other specified disorders of muscle: Secondary | ICD-10-CM | POA: Diagnosis not present

## 2016-05-19 DIAGNOSIS — R278 Other lack of coordination: Secondary | ICD-10-CM | POA: Diagnosis not present

## 2016-05-19 DIAGNOSIS — M25511 Pain in right shoulder: Secondary | ICD-10-CM | POA: Diagnosis not present

## 2016-05-19 DIAGNOSIS — R2689 Other abnormalities of gait and mobility: Secondary | ICD-10-CM | POA: Diagnosis not present

## 2016-05-19 DIAGNOSIS — M6259 Muscle wasting and atrophy, not elsewhere classified, multiple sites: Secondary | ICD-10-CM | POA: Diagnosis not present

## 2016-05-19 DIAGNOSIS — F0390 Unspecified dementia without behavioral disturbance: Secondary | ICD-10-CM | POA: Diagnosis not present

## 2016-05-22 DIAGNOSIS — F0390 Unspecified dementia without behavioral disturbance: Secondary | ICD-10-CM | POA: Diagnosis not present

## 2016-05-22 DIAGNOSIS — R2689 Other abnormalities of gait and mobility: Secondary | ICD-10-CM | POA: Diagnosis not present

## 2016-05-22 DIAGNOSIS — R278 Other lack of coordination: Secondary | ICD-10-CM | POA: Diagnosis not present

## 2016-05-22 DIAGNOSIS — M6289 Other specified disorders of muscle: Secondary | ICD-10-CM | POA: Diagnosis not present

## 2016-05-22 DIAGNOSIS — M6259 Muscle wasting and atrophy, not elsewhere classified, multiple sites: Secondary | ICD-10-CM | POA: Diagnosis not present

## 2016-05-22 DIAGNOSIS — M25511 Pain in right shoulder: Secondary | ICD-10-CM | POA: Diagnosis not present

## 2016-05-26 DIAGNOSIS — F0390 Unspecified dementia without behavioral disturbance: Secondary | ICD-10-CM | POA: Diagnosis not present

## 2016-05-26 DIAGNOSIS — M6289 Other specified disorders of muscle: Secondary | ICD-10-CM | POA: Diagnosis not present

## 2016-05-26 DIAGNOSIS — M25511 Pain in right shoulder: Secondary | ICD-10-CM | POA: Diagnosis not present

## 2016-05-26 DIAGNOSIS — R2689 Other abnormalities of gait and mobility: Secondary | ICD-10-CM | POA: Diagnosis not present

## 2016-05-26 DIAGNOSIS — R278 Other lack of coordination: Secondary | ICD-10-CM | POA: Diagnosis not present

## 2016-05-26 DIAGNOSIS — M6259 Muscle wasting and atrophy, not elsewhere classified, multiple sites: Secondary | ICD-10-CM | POA: Diagnosis not present

## 2016-05-29 DIAGNOSIS — M19011 Primary osteoarthritis, right shoulder: Secondary | ICD-10-CM | POA: Diagnosis not present

## 2016-05-29 DIAGNOSIS — R531 Weakness: Secondary | ICD-10-CM | POA: Diagnosis not present

## 2016-05-29 DIAGNOSIS — M6289 Other specified disorders of muscle: Secondary | ICD-10-CM | POA: Diagnosis not present

## 2016-05-29 DIAGNOSIS — R54 Age-related physical debility: Secondary | ICD-10-CM | POA: Diagnosis not present

## 2016-05-29 DIAGNOSIS — M25511 Pain in right shoulder: Secondary | ICD-10-CM | POA: Diagnosis not present

## 2016-05-29 DIAGNOSIS — R0600 Dyspnea, unspecified: Secondary | ICD-10-CM | POA: Diagnosis not present

## 2016-05-29 DIAGNOSIS — F0390 Unspecified dementia without behavioral disturbance: Secondary | ICD-10-CM | POA: Diagnosis not present

## 2016-05-29 DIAGNOSIS — R278 Other lack of coordination: Secondary | ICD-10-CM | POA: Diagnosis not present

## 2016-05-29 DIAGNOSIS — R5381 Other malaise: Secondary | ICD-10-CM | POA: Diagnosis not present

## 2016-05-31 DIAGNOSIS — F0390 Unspecified dementia without behavioral disturbance: Secondary | ICD-10-CM | POA: Diagnosis not present

## 2016-05-31 DIAGNOSIS — M6289 Other specified disorders of muscle: Secondary | ICD-10-CM | POA: Diagnosis not present

## 2016-05-31 DIAGNOSIS — M19011 Primary osteoarthritis, right shoulder: Secondary | ICD-10-CM | POA: Diagnosis not present

## 2016-05-31 DIAGNOSIS — R0600 Dyspnea, unspecified: Secondary | ICD-10-CM | POA: Diagnosis not present

## 2016-05-31 DIAGNOSIS — R278 Other lack of coordination: Secondary | ICD-10-CM | POA: Diagnosis not present

## 2016-05-31 DIAGNOSIS — M25511 Pain in right shoulder: Secondary | ICD-10-CM | POA: Diagnosis not present

## 2016-06-02 DIAGNOSIS — F0390 Unspecified dementia without behavioral disturbance: Secondary | ICD-10-CM | POA: Diagnosis not present

## 2016-06-02 DIAGNOSIS — M25511 Pain in right shoulder: Secondary | ICD-10-CM | POA: Diagnosis not present

## 2016-06-02 DIAGNOSIS — M6289 Other specified disorders of muscle: Secondary | ICD-10-CM | POA: Diagnosis not present

## 2016-06-02 DIAGNOSIS — R0600 Dyspnea, unspecified: Secondary | ICD-10-CM | POA: Diagnosis not present

## 2016-06-02 DIAGNOSIS — M19011 Primary osteoarthritis, right shoulder: Secondary | ICD-10-CM | POA: Diagnosis not present

## 2016-06-02 DIAGNOSIS — R278 Other lack of coordination: Secondary | ICD-10-CM | POA: Diagnosis not present

## 2016-06-05 DIAGNOSIS — R278 Other lack of coordination: Secondary | ICD-10-CM | POA: Diagnosis not present

## 2016-06-05 DIAGNOSIS — R0600 Dyspnea, unspecified: Secondary | ICD-10-CM | POA: Diagnosis not present

## 2016-06-05 DIAGNOSIS — M25511 Pain in right shoulder: Secondary | ICD-10-CM | POA: Diagnosis not present

## 2016-06-05 DIAGNOSIS — M6289 Other specified disorders of muscle: Secondary | ICD-10-CM | POA: Diagnosis not present

## 2016-06-05 DIAGNOSIS — M19011 Primary osteoarthritis, right shoulder: Secondary | ICD-10-CM | POA: Diagnosis not present

## 2016-06-05 DIAGNOSIS — F0390 Unspecified dementia without behavioral disturbance: Secondary | ICD-10-CM | POA: Diagnosis not present

## 2016-06-08 ENCOUNTER — Non-Acute Institutional Stay (SKILLED_NURSING_FACILITY): Payer: Medicare Other | Admitting: Adult Health

## 2016-06-08 ENCOUNTER — Encounter: Payer: Self-pay | Admitting: Adult Health

## 2016-06-08 DIAGNOSIS — M19011 Primary osteoarthritis, right shoulder: Secondary | ICD-10-CM

## 2016-06-08 DIAGNOSIS — F0280 Dementia in other diseases classified elsewhere without behavioral disturbance: Secondary | ICD-10-CM | POA: Diagnosis not present

## 2016-06-08 DIAGNOSIS — I10 Essential (primary) hypertension: Secondary | ICD-10-CM | POA: Diagnosis not present

## 2016-06-08 DIAGNOSIS — F5101 Primary insomnia: Secondary | ICD-10-CM | POA: Diagnosis not present

## 2016-06-08 DIAGNOSIS — R634 Abnormal weight loss: Secondary | ICD-10-CM

## 2016-06-08 DIAGNOSIS — G301 Alzheimer's disease with late onset: Secondary | ICD-10-CM | POA: Diagnosis not present

## 2016-06-08 DIAGNOSIS — R32 Unspecified urinary incontinence: Secondary | ICD-10-CM

## 2016-06-08 NOTE — Progress Notes (Signed)
Patient ID: [name], female   DOB: 1917/07/04, 82 y.o.   MRN: [id number]   Location:   [place]:  SNF (31) Provider:   Cindi Carbon, [place] [phone]   REED, Jonelle Sidle, DO  Patient Care Team: Gayland Curry, DO as PCP - General (Geriatric Medicine) [facility]  Extended Emergency Contact Information Primary Emergency Contact: Powell,Katherine  [place] Phone: [phone] Mobile Phone: [phone] Relation: Other Secondary Emergency Contact: Thatcher,Shirley Address: [address]  [place] of [place] Phone: [phone] Mobile Phone: [phone] Relation: Other  Code Status:  DNR Goals of care: Advanced Directive information Advanced Directives 04/04/2016  Does patient have an advance directive? Yes  Type of Advance Directive Out of facility DNR (pink MOST or yellow form);[place];Living will  Does patient want to make changes to advanced directive? -  Copy of advanced directive(s) in chart? Yes  Would patient like information on creating an advanced directive? -  Pre-existing out of facility DNR order (yellow form or pink MOST form) Yellow form placed in chart (order not valid for inpatient use)     Chief Complaint  Patient presents with  . Medical Management of Chronic Issues    HPI:  Pt is a 82 y.o. female seen today for medical management of chronic diseases.   Resides in skilled care at [facility] due to age, debility, and mild memory loss. She has a private caregiver with her each morning. She has become weaker over time and now uses a stand up lift to for transfers.  Ambien was d/c'd due to inefficacy.  Remeron was discontinued due to nightmares and Belsomra was started. Staff has charted that she is sleeping well at night. However, she continues to report vivid disturbing dreams. Appetite is overall small,  weight stable from last month. Not currently using aspercreme prn to right shoulder. Reports intermittent pain.    Past Medical History:  Diagnosis Date  . Arthritis   . Carpal tunnel syndrome on left   . Diverticulosis   . DJD (degenerative joint disease)   . Essential hypertension 06/29/2013   D/c 'd amlodipine  08/27/2014 due to low bp and relative tachycardia    . GERD (gastroesophageal reflux disease)   . Headache(784.0)   . High blood pressure    echo 09-normal  . Lower GI bleed 06/2013   required 2 units PRBC  . Macular degeneration syndrome   . Pleural effusion 08/07/2014  . Wears glasses   . Wears hearing aid    both  . Wears partial dentures    top and bottom   Past Surgical History:  Procedure Laterality Date  . APPENDECTOMY    . CARPAL TUNNEL RELEASE  2/12   rt  . CARPAL TUNNEL RELEASE  04/23/2012   Procedure: CARPAL TUNNEL RELEASE;  Surgeon: Wynonia Sours, MD;  Location: [place];  Service: Orthopedics;  Laterality: Left;  . EYE SURGERY     cataractas  . HIP SURGERY     left  . JOINT REPLACEMENT  2006   rt total hip x2  . KNEE SURGERY    . THYROID CYST EXCISION    . TONSILLECTOMY      Allergies  Allergen Reactions  . Celebrex [Celecoxib] Itching  . Morphine And Related Nausea Only  . Vicodin [Hydrocodone-Acetaminophen] Itching  . Sulfa Antibiotics Rash      Medication List  Accurate as of 06/08/16  3:59 PM. Always use your most recent med list.          acetaminophen 500 MG tablet Commonly known as:  TYLENOL Take 1,000 mg by mouth 3 (three) times daily.   CALCIUM 600 + D PO Take 1 tablet by mouth 2 (two) times daily.   cetirizine 10 MG tablet Commonly known as:  ZYRTEC Take 10 mg by mouth every morning.   diltiazem 120 MG tablet Commonly known as:  CARDIZEM Take 120 mg by mouth daily.   docusate sodium 100 MG capsule Commonly known as:  COLACE Take 100 mg by mouth 2 (two) times daily.   feeding supplement  (ENSURE COMPLETE) Liqd Take 237 mLs by mouth 3 (three) times daily between meals.   fluticasone 27.5 MCG/SPRAY nasal spray Commonly known as:  VERAMYST Place 1 spray into the nose daily.   lisinopril 20 MG tablet Commonly known as:  PRINIVIL,ZESTRIL Take 20 mg by mouth daily.   polyethylene glycol packet Commonly known as:  MIRALAX / GLYCOLAX Take 17 g by mouth every other day.   ranitidine 150 MG capsule Commonly known as:  ZANTAC Take 150 mg by mouth daily as needed for heartburn (gerd).   simethicone 80 MG chewable tablet Commonly known as:  MYLICON Chew 0000000 mg by mouth 2 (two) times daily.   Suvorexant 10 MG Tabs Commonly known as:  BELSOMRA Take 10 mg by mouth at bedtime as needed.   trolamine salicylate 10 % cream Commonly known as:  ASPERCREME Apply 1 application topically 4 (four) times daily as needed for muscle pain.   vitamin B-12 1000 MCG tablet Commonly known as:  CYANOCOBALAMIN Take 1,000 mcg by mouth daily.       Review of Systems  Constitutional: Negative for activity change, appetite change, chills, diaphoresis, fatigue, fever and unexpected weight change.  HENT: Positive for hearing loss. Negative for congestion, ear discharge, ear pain and tinnitus.   Respiratory: Negative for cough, shortness of breath and wheezing.   Cardiovascular: Negative for chest pain, palpitations and leg swelling.  Gastrointestinal: Negative for abdominal distention, abdominal pain, constipation and diarrhea.       Excess gas  Genitourinary: Negative for difficulty urinating and dysuria.  Musculoskeletal: Positive for arthralgias and gait problem. Negative for back pain, joint swelling and myalgias.  Skin: Negative for rash and wound.  Neurological: Positive for weakness. Negative for dizziness, tremors, seizures, syncope, facial asymmetry, speech difficulty, light-headedness, numbness and headaches.  Psychiatric/Behavioral: Negative for agitation, behavioral problems and  confusion.       Mild memory loss    Immunization History  Administered Date(s) Administered  . Influenza Split 05/28/2014  . Influenza-Unspecified 06/10/2015  . Pneumococcal Conjugate-13 03/03/2014   Pertinent  Health Maintenance Due  Topic Date Due  . PNA vac Low Risk Adult (2 of 2 - PPSV23) 03/04/2015  . INFLUENZA VACCINE  03/28/2016  . DEXA SCAN  Completed   Fall Risk  08/12/2015 07/15/2015 02/15/2015 01/07/2015 01/07/2015  Falls in the past year? Yes Yes Yes Yes Yes  Number falls in past yr: 1 1 1 1 1   Injury with Fall? No - - Yes -  Risk Factor Category  - - High Fall Risk High Fall Risk -  Risk for fall due to : History of fall(s);Impaired balance/gait History of fall(s) History of fall(s) History of fall(s) History of fall(s);Impaired balance/gait  Follow up Falls evaluation completed Falls evaluation completed - Falls evaluation completed Falls evaluation completed  Functional Status Survey:   Wt Readings from Last 3 Encounters:  06/08/16 99 lb (44.9 kg)  05/15/16 99 lb 9.6 oz (45.2 kg)  03/06/16 101 lb 12.8 oz (46.2 kg)   Vitals:   06/08/16 1547  BP: (!) 143/82  Pulse: 73  Resp: 16  Temp: 98.7 F (37.1 C)  SpO2: 94%  Weight: 99 lb (44.9 kg)   Body mass index is 17.54 kg/m. Physical Exam  Constitutional: No distress.  HENT:  Head: Normocephalic and atraumatic.  Right Ear: External ear normal.  Left Ear: External ear normal.  Nose: Nose normal.  Mouth/Throat: Oropharynx is clear and moist. No oropharyngeal exudate.  Neck: No JVD present.  Cardiovascular: Normal rate and regular rhythm.   No murmur heard. Pulmonary/Chest: Effort normal. No respiratory distress. She has wheezes (mild bilat).  Abdominal: Soft. Bowel sounds are normal. She exhibits no distension.  Musculoskeletal: She exhibits no edema or tenderness.  Decreased ROM to the right shoulder  Neurological: She is alert.  Oriented to self, situation, place, not time  Skin: Skin is warm and dry.  She is not diaphoretic.  Psychiatric: She has a normal mood and affect.  Nursing note and vitals reviewed.   Labs reviewed:  Recent Labs  08/11/15 03/07/16 1100  NA 137 140  K 4.1 4.2  BUN 17 18  CREATININE 0.7 0.6   No results for input(s): AST, ALT, ALKPHOS, BILITOT, PROT, ALBUMIN in the last 8760 hours.  Recent Labs  08/11/15 03/07/16 1100  WBC 6.0 6.3  HGB 13.5 13.5  HCT 41 44  PLT 303 222   Lab Results  Component Value Date   TSH 4.26 11/17/2014   No results found for: HGBA1C No results found for: CHOL, HDL, LDLCALC, LDLDIRECT, TRIG, CHOLHDL  Significant Diagnostic Results in last 30 days:  No results found.  Assessment/Plan  1. Late onset Alzheimer's disease without behavioral disturbance Progressive memory loss and confusion Would not benefit at this point with meds Continue supportive care  2. Primary osteoarthritis of right shoulder Stable Continue scheduled tylenol and prn aspercreme   3. Primary insomnia Continue prn Belsomra  4. Loss of weight Stable Continue supplements and weight monitor Off remeron  5. Essential hypertension Controlled Continue lisinopril and cardizem  6. Incontinence in female Toileting q 2 hrs    Family/ staff Communication: discussed with staff/resident  Labs/tests ordered: NA  Cindi Carbon, [place] [phone]

## 2016-06-09 DIAGNOSIS — M19011 Primary osteoarthritis, right shoulder: Secondary | ICD-10-CM | POA: Diagnosis not present

## 2016-06-09 DIAGNOSIS — M25511 Pain in right shoulder: Secondary | ICD-10-CM | POA: Diagnosis not present

## 2016-06-09 DIAGNOSIS — R0600 Dyspnea, unspecified: Secondary | ICD-10-CM | POA: Diagnosis not present

## 2016-06-09 DIAGNOSIS — R278 Other lack of coordination: Secondary | ICD-10-CM | POA: Diagnosis not present

## 2016-06-09 DIAGNOSIS — M6289 Other specified disorders of muscle: Secondary | ICD-10-CM | POA: Diagnosis not present

## 2016-06-09 DIAGNOSIS — F0390 Unspecified dementia without behavioral disturbance: Secondary | ICD-10-CM | POA: Diagnosis not present

## 2016-06-15 DIAGNOSIS — Z23 Encounter for immunization: Secondary | ICD-10-CM | POA: Diagnosis not present

## 2016-06-22 DIAGNOSIS — B351 Tinea unguium: Secondary | ICD-10-CM | POA: Diagnosis not present

## 2016-07-10 ENCOUNTER — Encounter: Payer: Self-pay | Admitting: Adult Health

## 2016-07-10 ENCOUNTER — Non-Acute Institutional Stay (SKILLED_NURSING_FACILITY): Payer: Medicare Other | Admitting: Adult Health

## 2016-07-10 DIAGNOSIS — K2100 Gastro-esophageal reflux disease with esophagitis, without bleeding: Secondary | ICD-10-CM

## 2016-07-10 DIAGNOSIS — J309 Allergic rhinitis, unspecified: Secondary | ICD-10-CM | POA: Diagnosis not present

## 2016-07-10 DIAGNOSIS — F5101 Primary insomnia: Secondary | ICD-10-CM

## 2016-07-10 DIAGNOSIS — F0280 Dementia in other diseases classified elsewhere without behavioral disturbance: Secondary | ICD-10-CM | POA: Diagnosis not present

## 2016-07-10 DIAGNOSIS — K5901 Slow transit constipation: Secondary | ICD-10-CM

## 2016-07-10 DIAGNOSIS — M19011 Primary osteoarthritis, right shoulder: Secondary | ICD-10-CM | POA: Diagnosis not present

## 2016-07-10 DIAGNOSIS — G301 Alzheimer's disease with late onset: Secondary | ICD-10-CM | POA: Diagnosis not present

## 2016-07-10 DIAGNOSIS — R634 Abnormal weight loss: Secondary | ICD-10-CM | POA: Diagnosis not present

## 2016-07-10 NOTE — Progress Notes (Signed)
Patient ID: [name], female   DOB: 12-04-1916, 82 y.o.   MRN: [id number]   Location:   [place]:  SNF (31) Provider:   Cindi Carbon, [place] [phone]   REED, Jonelle Sidle, DO  Patient Care Team: Gayland Curry, DO as PCP - General (Geriatric Medicine) [facility]  Extended Emergency Contact Information Primary Emergency Contact: Powell,Katherine  [place] Phone: [phone] Mobile Phone: [phone] Relation: Other Secondary Emergency Contact: Thatcher,Shirley Address: [address]  [place] Phone: [phone] Mobile Phone: [phone] Relation: Other  Code Status:  DNR Goals of care: Advanced Directive information Advanced Directives 04/04/2016  Does patient have an advance directive? Yes  Type of Advance Directive Out of facility DNR (pink MOST or yellow form);[place];Living will  Does patient want to make changes to advanced directive? -  Copy of advanced directive(s) in chart? Yes  Would patient like information on creating an advanced directive? -  Pre-existing out of facility DNR order (yellow form or pink MOST form) Yellow form placed in chart (order not valid for inpatient use)     Chief Complaint  Patient presents with  . Medical Management of Chronic Issues    HPI:  Pt is a 82 y.o. female seen today for medical management of chronic diseases. She resides in skilled care at [facility] due to age, debility, and mild memory loss. She has a private caregiver, who is also her POA,  with her each morning. Due to increased weakness, she now uses a stand up lift to for transfers. She has advanced Alz dementia and is not taking any memory medication. Last MMSE in 2015. She has lost about 2 lbs over the past 2 months. She has a poor appetite. She complains that she does not like the food served here and is  requesting pizza.  She reports difficulty sleeping. She was on Ambien, but taken off due to ineffectiveness. Remeron was d/c'ed due to nightmares. She was started on Belsomra, prn in August. Staff reports that she gets the [place] most nights.  Her GERD is well managed with Zantac.  She has had regular BMs. Constipation is managed with Miralax and Colace.  She has primary osteoarthritis in her R shoulder. Mild pain with passive ROM. Pain managed well with Tylenol and Aspercreme.  She has chronic allergic rhinitis. Symptoms managed well with fluticasone spray and Zyrtec.     Past Medical History:  Diagnosis Date  . Arthritis   . Carpal tunnel syndrome on left   . Diverticulosis   . DJD (degenerative joint disease)   . Essential hypertension 06/29/2013   D/c 'd amlodipine  08/27/2014 due to low bp and relative tachycardia    . GERD (gastroesophageal reflux disease)   . Headache(784.0)   . High blood pressure    echo 09-normal  . Lower GI bleed 06/2013   required 2 units PRBC  . Macular degeneration syndrome   . Pleural effusion 08/07/2014  . Wears glasses   . Wears hearing aid    both  . Wears partial dentures    top and bottom   Past Surgical History:  Procedure Laterality Date  . APPENDECTOMY    . CARPAL TUNNEL RELEASE  2/12   rt  . CARPAL TUNNEL RELEASE  04/23/2012   Procedure: CARPAL TUNNEL RELEASE;  Surgeon: Wynonia Sours, MD;  Location: [facility];  Service:  Orthopedics;  Laterality: Left;  . EYE SURGERY     cataractas  . HIP SURGERY     left  . JOINT REPLACEMENT  2006   rt total hip x2  . KNEE SURGERY    . THYROID CYST EXCISION    . TONSILLECTOMY      Allergies  Allergen Reactions  . Celebrex [Celecoxib] Itching  . Morphine And Related Nausea Only  . Vicodin [Hydrocodone-Acetaminophen] Itching  . Sulfa Antibiotics Rash      Medication List       Accurate as of 07/10/16  2:05 PM. Always use your most recent med list.            acetaminophen 500 MG tablet Commonly known as:  TYLENOL Take 1,000 mg by mouth 3 (three) times daily.   CALCIUM 600 + D PO Take 1 tablet by mouth 2 (two) times daily.   cetirizine 10 MG tablet Commonly known as:  ZYRTEC Take 10 mg by mouth every morning.   diltiazem 120 MG tablet Commonly known as:  CARDIZEM Take 120 mg by mouth daily.   docusate sodium 100 MG capsule Commonly known as:  COLACE Take 100 mg by mouth 2 (two) times daily.   feeding supplement (ENSURE COMPLETE) Liqd Take 237 mLs by mouth 3 (three) times daily between meals.   fluticasone 27.5 MCG/SPRAY nasal spray Commonly known as:  VERAMYST Place 1 spray into the nose daily.   lisinopril 20 MG tablet Commonly known as:  PRINIVIL,ZESTRIL Take 20 mg by mouth daily.   polyethylene glycol packet Commonly known as:  MIRALAX / GLYCOLAX Take 17 g by mouth every other day.   ranitidine 150 MG capsule Commonly known as:  ZANTAC Take 150 mg by mouth daily as needed for heartburn (gerd).   simethicone 80 MG chewable tablet Commonly known as:  MYLICON Chew 0000000 mg by mouth 2 (two) times daily.   Suvorexant 10 MG Tabs Commonly known as:  BELSOMRA Take 10 mg by mouth at bedtime as needed.   trolamine salicylate 10 % cream Commonly known as:  ASPERCREME Apply 1 application topically 4 (four) times daily as needed for muscle pain.   vitamin B-12 1000 MCG tablet Commonly known as:  CYANOCOBALAMIN Take 1,000 mcg by mouth daily.       Review of Systems  Constitutional: Negative for activity change, appetite change, chills, diaphoresis, fatigue, fever and unexpected weight change.  HENT: Positive for hearing loss. Negative for congestion, ear discharge, ear pain, postnasal drip, rhinorrhea, sinus pain, tinnitus, trouble swallowing and voice change.   Eyes: Negative for redness and itching.  Respiratory: Negative for apnea, cough, choking, shortness of breath and wheezing.   Cardiovascular: Negative for chest  pain, palpitations and leg swelling.  Gastrointestinal: Negative for abdominal distention, abdominal pain, constipation and diarrhea.       Excess gas  Genitourinary: Negative for difficulty urinating and dysuria.  Musculoskeletal: Positive for arthralgias and gait problem. Negative for back pain, joint swelling and myalgias.  Skin: Negative for rash and wound.  Neurological: Positive for weakness. Negative for dizziness, tremors, seizures, syncope, facial asymmetry, speech difficulty, light-headedness, numbness and headaches.  Psychiatric/Behavioral: Positive for sleep disturbance. Negative for agitation, behavioral problems and confusion. The patient is not nervous/anxious.        Mild memory loss    Immunization History  Administered Date(s) Administered  . Influenza Inj Mdck Quad Pf 06/15/2016  . Influenza Split 05/28/2014  . Influenza-Unspecified 06/10/2015  . Pneumococcal Conjugate-13 03/03/2014  Pertinent  Health Maintenance Due  Topic Date Due  . PNA vac Low Risk Adult (2 of 2 - PPSV23) 03/04/2015  . INFLUENZA VACCINE  Completed  . DEXA SCAN  Completed   Fall Risk  08/12/2015 07/15/2015 02/15/2015 01/07/2015 01/07/2015  Falls in the past year? Yes Yes Yes Yes Yes  Number falls in past yr: 1 1 1 1 1   Injury with Fall? No - - Yes -  Risk Factor Category  - - High Fall Risk High Fall Risk -  Risk for fall due to : History of fall(s);Impaired balance/gait History of fall(s) History of fall(s) History of fall(s) History of fall(s);Impaired balance/gait  Follow up Falls evaluation completed Falls evaluation completed - Falls evaluation completed Falls evaluation completed   Functional Status Survey:   Wt Readings from Last 3 Encounters:  07/10/16 97 lb (44 kg)  06/08/16 99 lb (44.9 kg)  05/15/16 99 lb 9.6 oz (45.2 kg)   Vitals:   07/10/16 1355  Weight: 97 lb (44 kg)   Body mass index is 17.18 kg/m. Physical Exam  Constitutional: No distress.  HENT:  Head: Normocephalic  and atraumatic.  Right Ear: External ear normal.  Left Ear: External ear normal.  Nose: Nose normal.  Mouth/Throat: Oropharynx is clear and moist. No oropharyngeal exudate.  HOH  Eyes: Pupils are equal, round, and reactive to light. Right eye exhibits no discharge. Left eye exhibits no discharge.  Neck: No JVD present. No thyromegaly present.  Cardiovascular: Normal rate, regular rhythm and normal heart sounds.   No murmur heard. Pulmonary/Chest: Effort normal. No respiratory distress. She has no wheezes. She has no rales.  Abdominal: Soft. Bowel sounds are normal. She exhibits no distension.  Musculoskeletal: She exhibits tenderness (R shoulder tender with passive ROM). She exhibits no edema or deformity.  Decreased ROM to the right shoulder  Lymphadenopathy:    She has no cervical adenopathy.  Neurological: She is alert.  Oriented to self, situation, place, not time  Skin: Skin is warm and dry. She is not diaphoretic.  Psychiatric: She has a normal mood and affect.  Nursing note and vitals reviewed.   Labs reviewed:  Recent Labs  08/11/15 03/07/16 1100  NA 137 140  K 4.1 4.2  BUN 17 18  CREATININE 0.7 0.6   No results for input(s): AST, ALT, ALKPHOS, BILITOT, PROT, ALBUMIN in the last 8760 hours.  Recent Labs  08/11/15 03/07/16 1100  WBC 6.0 6.3  HGB 13.5 13.5  HCT 41 44  PLT 303 222   Lab Results  Component Value Date   TSH 4.26 11/17/2014   No results found for: HGBA1C No results found for: CHOL, HDL, LDLCALC, LDLDIRECT, TRIG, CHOLHDL  Significant Diagnostic Results in last 30 days:  No results found.  Assessment/Plan 1. Late onset Alzheimer's disease without behavioral disturbance -Progressive memory loss and confusion -Would not benefit at this point with meds -Continue supportive care  2. Loss of weight -Continue Ensure and supplements -Continue to monitor weight  3. Primary insomnia -Change Belsomra to scheduled dose   4. Gastroesophageal  reflux disease with esophagitis -Controlled with Zantac  5. Slow transit constipation -Resolved -Continue Miralax and Colace  6. Primary osteoarthritis of right shoulder -Stable -Continue Tylenol and prn Aspercreme -Continue Calcium/Vit D for bone health  7. Chronic allergic rhinitis, unspecified seasonality, unspecified trigger -Stable -Continue fluticasone spray, Zyrtec   Family/ staff Communication: discussed with staff/resident  Labs/tests ordered: NA  Cindi Carbon, [place] [phone]

## 2016-08-17 ENCOUNTER — Encounter: Payer: Self-pay | Admitting: Adult Health

## 2016-08-17 ENCOUNTER — Non-Acute Institutional Stay (SKILLED_NURSING_FACILITY): Payer: Medicare Other | Admitting: Adult Health

## 2016-08-17 DIAGNOSIS — M19011 Primary osteoarthritis, right shoulder: Secondary | ICD-10-CM

## 2016-08-17 DIAGNOSIS — G301 Alzheimer's disease with late onset: Secondary | ICD-10-CM

## 2016-08-17 DIAGNOSIS — H02003 Unspecified entropion of right eye, unspecified eyelid: Secondary | ICD-10-CM | POA: Diagnosis not present

## 2016-08-17 DIAGNOSIS — R140 Abdominal distension (gaseous): Secondary | ICD-10-CM

## 2016-08-17 DIAGNOSIS — F0280 Dementia in other diseases classified elsewhere without behavioral disturbance: Secondary | ICD-10-CM

## 2016-08-17 DIAGNOSIS — K5901 Slow transit constipation: Secondary | ICD-10-CM

## 2016-08-17 NOTE — Progress Notes (Signed)
This encounter was created in error - please disregard.

## 2016-08-17 NOTE — Progress Notes (Signed)
Patient ID: [name], female   DOB: 1917/07/12, 82 y.o.   MRN: [id number]   Location:   [place] of Service:   SNF Provider:   Cindi Carbon, [place] [phone]   Hollace Kinnier, DO  Patient Care Team: Gayland Curry, DO as PCP - General (Geriatric Medicine) [facility]  Extended Emergency Contact Information Primary Emergency Contact: Powell,Katherine  [place] Phone: [phone] Mobile Phone: [phone] Relation: Other Secondary Emergency Contact: Thatcher,Shirley Address: [address]  [place] of [place] Phone: [phone] Mobile Phone: [phone] Relation: Other  Code Status:  DNR Goals of care: Advanced Directive information Advanced Directives 08/24/2016  Does Patient Have a Medical Advance Directive? Yes  Type of Paramedic of [place];Living will;Out of facility DNR (pink MOST or yellow form)  Does patient want to make changes to medical advance directive? -  Copy of [place] in Chart? Yes  Would patient like information on creating a medical advance directive? -  Pre-existing out of facility DNR order (yellow form or pink MOST form) Yellow form placed in chart (order not valid for inpatient use)     Chief Complaint  Patient presents with  . Medical Management of Chronic Issues    HPI:  Pt is a 82 y.o. female seen today for medical management of chronic diseases. She resides in skilled care at [facility] due to age, debility, and mild memory loss. She has a private caregiver, who is also her POA,  with her each morning. Due to increased weakness, she now uses a stand up lift to for transfers. She has advanced Alz dementia and is not taking any memory medication due to age, debility, and lack of benefit Hx of excessive gas on simethicone. Denies symptoms today. She has had regular BMs.  Constipation is managed with Miralax and Colace.  She has primary osteoarthritis in her R shoulder. Mild pain with passive ROM. Pain managed well with Tylenol and Aspercreme.  Right eyelid irritated and turned inward per pt Weight stable but low at 97 lbs    Past Medical History:  Diagnosis Date  . Arthritis   . Carpal tunnel syndrome on left   . Diverticulosis   . DJD (degenerative joint disease)   . Essential hypertension 06/29/2013   D/c 'd amlodipine  08/27/2014 due to low bp and relative tachycardia    . GERD (gastroesophageal reflux disease)   . Headache(784.0)   . High blood pressure    echo 09-normal  . Lower GI bleed 06/2013   required 2 units PRBC  . Macular degeneration syndrome   . Pleural effusion 08/07/2014  . Wears glasses   . Wears hearing aid    both  . Wears partial dentures    top and bottom   Past Surgical History:  Procedure Laterality Date  . APPENDECTOMY    . CARPAL TUNNEL RELEASE  2/12   rt  . CARPAL TUNNEL RELEASE  04/23/2012   Procedure: CARPAL TUNNEL RELEASE;  Surgeon: Wynonia Sours, MD;  Location: [facility];  Service: Orthopedics;  Laterality: Left;  . EYE SURGERY     cataractas  . HIP SURGERY     left  . JOINT REPLACEMENT  2006   rt total hip x2  . KNEE SURGERY    . THYROID CYST EXCISION    . TONSILLECTOMY      Allergies  Allergen Reactions  .  Celebrex [Celecoxib] Itching  . Morphine And Related Nausea Only  . Vicodin [Hydrocodone-Acetaminophen] Itching  . Sulfa Antibiotics Rash    Allergies as of 08/17/2016      Reactions   Celebrex [celecoxib] Itching   Morphine And Related Nausea Only   Vicodin [hydrocodone-acetaminophen] Itching   Sulfa Antibiotics Rash      Medication List       Accurate as of 08/17/16 11:59 PM. Always use your most recent med list.          acetaminophen 500 MG tablet Commonly known as:  TYLENOL Take 1,000 mg by mouth 3 (three) times daily.   CALCIUM 600 + D PO Take 1 tablet by  mouth 2 (two) times daily.   cetirizine 10 MG tablet Commonly known as:  ZYRTEC Take 10 mg by mouth every morning.   diltiazem 120 MG tablet Commonly known as:  CARDIZEM Take 120 mg by mouth daily.   docusate sodium 100 MG capsule Commonly known as:  COLACE Take 100 mg by mouth 2 (two) times daily.   feeding supplement (ENSURE COMPLETE) Liqd Take 237 mLs by mouth 3 (three) times daily between meals.   fluticasone 27.5 MCG/SPRAY nasal spray Commonly known as:  VERAMYST Place 1 spray into the nose daily.   lisinopril 20 MG tablet Commonly known as:  PRINIVIL,ZESTRIL Take 20 mg by mouth daily.   polyethylene glycol packet Commonly known as:  MIRALAX / GLYCOLAX Take 17 g by mouth every other day.   ranitidine 150 MG capsule Commonly known as:  ZANTAC Take 150 mg by mouth daily as needed for heartburn (gerd).   simethicone 80 MG chewable tablet Commonly known as:  MYLICON Chew 0000000 mg by mouth 2 (two) times daily.   Suvorexant 10 MG Tabs Commonly known as:  BELSOMRA Take 10 mg by mouth at bedtime as needed.   trolamine salicylate 10 % cream Commonly known as:  ASPERCREME Apply 1 application topically 4 (four) times daily as needed for muscle pain.   vitamin B-12 1000 MCG tablet Commonly known as:  CYANOCOBALAMIN Take 1,000 mcg by mouth daily.       Review of Systems  Constitutional: Negative for activity change, appetite change, chills, diaphoresis, fatigue, fever and unexpected weight change.  HENT: Positive for hearing loss. Negative for congestion, ear discharge, ear pain, postnasal drip, rhinorrhea, sinus pain, tinnitus, trouble swallowing and voice change.   Eyes: Negative for redness and itching.  Respiratory: Negative for apnea, cough, choking, shortness of breath and wheezing.   Cardiovascular: Negative for chest pain, palpitations and leg swelling.  Gastrointestinal: Negative for abdominal distention, abdominal pain, constipation and diarrhea.    Genitourinary: Negative for difficulty urinating and dysuria.  Musculoskeletal: Positive for arthralgias and gait problem. Negative for back pain, joint swelling and myalgias.  Skin: Negative for rash and wound.  Neurological: Positive for weakness. Negative for dizziness, tremors, seizures, syncope, facial asymmetry, speech difficulty, light-headedness, numbness and headaches.  Psychiatric/Behavioral: Positive for sleep disturbance. Negative for agitation, behavioral problems and confusion. The patient is not nervous/anxious.      memory loss    Immunization History  Administered Date(s) Administered  . Influenza Inj Mdck Quad Pf 06/15/2016  . Influenza Split 05/28/2014  . Influenza-Unspecified 06/10/2015  . Pneumococcal Conjugate-13 03/03/2014   Pertinent  Health Maintenance Due  Topic Date Due  . PNA vac Low Risk Adult (2 of 2 - PPSV23) 03/04/2015  . INFLUENZA VACCINE  Completed  . DEXA SCAN  Completed   Fall Risk  08/12/2015 07/15/2015 02/15/2015 01/07/2015 01/07/2015  Falls in the past year? Yes Yes Yes Yes Yes  Number falls in past yr: 1 1 1 1 1   Injury with Fall? No - - Yes -  Risk Factor Category  - - High Fall Risk High Fall Risk -  Risk for fall due to : History of fall(s);Impaired balance/gait History of fall(s) History of fall(s) History of fall(s) History of fall(s);Impaired balance/gait  Follow up Falls evaluation completed Falls evaluation completed - Falls evaluation completed Falls evaluation completed   Functional Status Survey:   Wt Readings from Last 3 Encounters:  08/17/16 97 lb (44 kg)  07/10/16 97 lb (44 kg)  06/08/16 99 lb (44.9 kg)   Vitals:   08/17/16 1555  BP: 139/78  Pulse: 70  Resp: 20  Temp: 98.2 F (36.8 C)  SpO2: 97%  Weight: 97 lb (44 kg)   Body mass index is 17.18 kg/m. Physical Exam  Constitutional: No distress.  HENT:  Head: Normocephalic and atraumatic.  Right Ear: External ear normal.  Left Ear: External ear normal.  Nose:  Nose normal.  Mouth/Throat: Oropharynx is clear and moist. No oropharyngeal exudate.  HOH  Right eye lid entropion noted with small amt of yellow drainage. Left eye exhibits no discharge.  Neck: No JVD present. No thyromegaly present.  Cardiovascular: Normal rate, regular rhythm and normal heart sounds.   No murmur heard. Pulmonary/Chest: Effort normal. No respiratory distress. She has no wheezes. She has no rales.  Abdominal: Soft. Bowel sounds are normal. She exhibits no distension.  Musculoskeletal: She exhibits tenderness (R shoulder tender with passive ROM). She exhibits no edema or deformity.  Decreased ROM to the right shoulder  Lymphadenopathy:    She has no cervical adenopathy.  Neurological: She is alert.  Oriented to self, situation, place, not time  Skin: Skin is warm and dry. She is not diaphoretic.  Psychiatric: She has a normal mood and affect.  Nursing note and vitals reviewed.   Labs reviewed:  Recent Labs  03/07/16 1100  NA 140  K 4.2  BUN 18  CREATININE 0.6   No results for input(s): AST, ALT, ALKPHOS, BILITOT, PROT, ALBUMIN in the last 8760 hours.  Recent Labs  03/07/16 1100  WBC 6.3  HGB 13.5  HCT 44  PLT 222   Lab Results  Component Value Date   TSH 4.26 11/17/2014   No results found for: HGBA1C No results found for: CHOL, HDL, LDLCALC, LDLDIRECT, TRIG, CHOLHDL  Significant Diagnostic Results in last 30 days:  No results found.  Assessment/Plan 1. Bloating No reports of this at this time D/c simethicone report excessive gas  2. Entropion of right eyelid Refresh 2 gtts right eye qhs  3. Primary osteoarthritis of right shoulder Intermittent pain reported  Aspercreme prn pain Tylenol 1 gram TID Does not like to take ultram or other stronger meds due to s/e  4. Late onset Alzheimer's disease without behavioral disturbance More forgetful over time and has some anxiety about using the call bell Reorient and address needs as  necessary May need low dose xanax if this does improve Would not benefit from memory meds  5. Slow transit constipation Controlled miralax 17 grams QOD  6. B12 deficiency Continue B12 1000 mcg qd  Family/ staff Communication: discussed with staff/resident  Labs/tests ordered: NA  Cindi Carbon, [place] [phone]

## 2016-08-24 ENCOUNTER — Encounter: Payer: Self-pay | Admitting: Adult Health

## 2016-08-24 DIAGNOSIS — H02009 Unspecified entropion of unspecified eye, unspecified eyelid: Secondary | ICD-10-CM | POA: Insufficient documentation

## 2016-09-19 ENCOUNTER — Encounter: Payer: Self-pay | Admitting: Internal Medicine

## 2016-09-19 ENCOUNTER — Non-Acute Institutional Stay (SKILLED_NURSING_FACILITY): Payer: Medicare Other | Admitting: Internal Medicine

## 2016-09-19 DIAGNOSIS — G301 Alzheimer's disease with late onset: Secondary | ICD-10-CM

## 2016-09-19 DIAGNOSIS — K2100 Gastro-esophageal reflux disease with esophagitis, without bleeding: Secondary | ICD-10-CM

## 2016-09-19 DIAGNOSIS — I10 Essential (primary) hypertension: Secondary | ICD-10-CM | POA: Diagnosis not present

## 2016-09-19 DIAGNOSIS — E43 Unspecified severe protein-calorie malnutrition: Secondary | ICD-10-CM

## 2016-09-19 DIAGNOSIS — F0280 Dementia in other diseases classified elsewhere without behavioral disturbance: Secondary | ICD-10-CM | POA: Diagnosis not present

## 2016-09-19 DIAGNOSIS — F5101 Primary insomnia: Secondary | ICD-10-CM

## 2016-09-19 DIAGNOSIS — K5901 Slow transit constipation: Secondary | ICD-10-CM | POA: Diagnosis not present

## 2016-09-19 DIAGNOSIS — H3530 Unspecified macular degeneration: Secondary | ICD-10-CM

## 2016-09-19 NOTE — Progress Notes (Signed)
Patient ID: [name], female   DOB: 1917/03/10, 83 y.o.   MRN: [id number]  Location:  [place] Room Number: 136 Place of Service:  SNF (31) Provider:  Sarin Comunale L. Mariea Clonts, D.O., C.M.D.  Hollace Kinnier, DO  Patient Care Team: Gayland Curry, DO as PCP - General (Geriatric Medicine) [facility]  Extended Emergency Contact Information Primary Emergency Contact: Powell,Katherine  [place] Phone: [phone] Mobile Phone: [phone] Relation: Other Secondary Emergency Contact: Thatcher,Shirley Address: [address]  [place] Phone: [phone] Mobile Phone: [phone] Relation: Other  Code Status:  DNR Goals of care: Advanced Directive information Advanced Directives 09/19/2016  Does Patient Have a Medical Advance Directive? -  Type of Advance Directive Out of facility DNR (pink MOST or yellow form);[place];Living will  Does patient want to make changes to medical advance directive? -  Copy of [place] in Chart? Yes  Would patient like information on creating a medical advance directive? -  Pre-existing out of facility DNR order (yellow form or pink MOST form) Yellow form placed in chart (order not valid for inpatient use)   Chief Complaint  Patient presents with  . Medical Management of Chronic Issues    Routine Visit    HPI:  Pt is a 83 y.o. female seen today for medical management of chronic diseases.    HTN:  bp today quite high.  Review of prior in matrix shows one at goal and three above goal, but all checked with automatic cuff by CNA.  Getting weekly bps.  Is on cardizem 120mg  daily and lisinopril 20mg  daily.    Constipation:  Managed with miralax, colace and simethicone.  GERD with esophagitis:  On zantac at this point and tolerating well.    Insomnia:  Has been tried on everything under the sun including  Beers-friendly agents like belsomra and melatonin, but does not sleep well with them.  At first, she did sleep better on belsomra but she began having vivid disturbing dreams.  It was scheduled after a few months due to regular use anyway.  Then it was stopped in Dec sometimes and she is back on ambien 5mg  at bedtime as needed.  Most patients require 20mg  of the belsomra for effectiveness.  AD:  Not on meds, requires help with ADLs, skilled care, very poor short term memory and repeats questions over course of 5-10 mins, even less.  Walks with walker and assistance    Protein cal malntr:  On ensure complete supplement tid between meals.  Weight 96.2 lbs at the beginning of the month.  Has continues to trend down since June of last year (2017) when she weighed 102.8 lbs.  Intake remains poor.  Needs meats cut up and a lot of cuing to begin meals.  Weight was said to have improved on remeron at first, but looking at trends it did not.  Remeron was then stopped b/c of nightmares.     Macular degeneration:  Not on any treatment at this time.  Past Medical History:  Diagnosis Date  . Arthritis   . Carpal tunnel syndrome on left   . Diverticulosis   . DJD (degenerative joint disease)   . Essential hypertension 06/29/2013   D/c 'd amlodipine  08/27/2014 due to low bp and relative tachycardia    . GERD (gastroesophageal reflux disease)   . Headache(784.0)   . High blood pressure  echo 09-normal  . Lower GI bleed 06/2013   required 2 units PRBC  . Macular degeneration syndrome   . Pleural effusion 08/07/2014  . Wears glasses   . Wears hearing aid    both  . Wears partial dentures    top and bottom   Past Surgical History:  Procedure Laterality Date  . APPENDECTOMY    . CARPAL TUNNEL RELEASE  2/12   rt  . CARPAL TUNNEL RELEASE  04/23/2012   Procedure: CARPAL TUNNEL RELEASE;  Surgeon: Wynonia Sours, MD;  Location: [place];  Service: Orthopedics;  Laterality: Left;  . EYE  SURGERY     cataractas  . HIP SURGERY     left  . JOINT REPLACEMENT  2006   rt total hip x2  . KNEE SURGERY    . THYROID CYST EXCISION    . TONSILLECTOMY      Allergies  Allergen Reactions  . Celebrex [Celecoxib] Itching  . Morphine And Related Nausea Only  . Vicodin [Hydrocodone-Acetaminophen] Itching  . Sulfa Antibiotics Rash    Allergies as of 09/19/2016      Reactions   Celebrex [celecoxib] Itching   Morphine And Related Nausea Only   Vicodin [hydrocodone-acetaminophen] Itching   Sulfa Antibiotics Rash      Medication List       Accurate as of 09/19/16  2:39 PM. Always use your most recent med list.          acetaminophen 500 MG tablet Commonly known as:  TYLENOL Take 1,000 mg by mouth 3 (three) times daily.   artificial tears ointment Place 2 drops into the right eye at bedtime.   CALCIUM 600 + D PO Take 1 tablet by mouth 2 (two) times daily.   cetirizine 10 MG tablet Commonly known as:  ZYRTEC Take 10 mg by mouth every morning.   diltiazem 120 MG tablet Commonly known as:  CARDIZEM Take 120 mg by mouth daily.   docusate sodium 100 MG capsule Commonly known as:  COLACE Take 100 mg by mouth 2 (two) times daily.   feeding supplement (ENSURE COMPLETE) Liqd Take 237 mLs by mouth 3 (three) times daily between meals.   fluticasone 27.5 MCG/SPRAY nasal spray Commonly known as:  VERAMYST Place 1 spray into the nose daily.   lisinopril 20 MG tablet Commonly known as:  PRINIVIL,ZESTRIL Take 20 mg by mouth daily.   polyethylene glycol packet Commonly known as:  MIRALAX / GLYCOLAX Take 17 g by mouth every other day.   ranitidine 150 MG capsule Commonly known as:  ZANTAC Take 150 mg by mouth daily as needed for heartburn (gerd).   simethicone 80 MG chewable tablet Commonly known as:  MYLICON Chew 0000000 mg by mouth 2 (two) times daily.   trolamine salicylate 10 % cream Commonly known as:  ASPERCREME Apply 1 application topically 4 (four) times  daily as needed for muscle pain.   vitamin B-12 1000 MCG tablet Commonly known as:  CYANOCOBALAMIN Take 1,000 mcg by mouth daily.   zolpidem 5 MG tablet Commonly known as:  AMBIEN Take 5 mg by mouth at bedtime as needed for sleep.       Review of Systems  Constitutional: Negative for activity change and appetite change.       Poor appetite, likes outside food better like fast food and pizza; weight loss continues  HENT: Positive for rhinorrhea. Negative for congestion.   Eyes: Positive for visual disturbance.  Respiratory: Negative for chest tightness  and shortness of breath.   Cardiovascular: Negative for chest pain and leg swelling.  Gastrointestinal: Positive for constipation. Negative for abdominal distention, abdominal pain, diarrhea, nausea and vomiting.       Gas  Genitourinary: Negative for dysuria.  Musculoskeletal: Positive for arthralgias.  Skin: Negative for color change.  Neurological: Positive for weakness.  Psychiatric/Behavioral: Positive for confusion and sleep disturbance. Negative for agitation and suicidal ideas.    Immunization History  Administered Date(s) Administered  . Influenza Inj Mdck Quad Pf 06/15/2016  . Influenza Split 05/28/2014  . Influenza-Unspecified 06/10/2015  . Pneumococcal Conjugate-13 03/03/2014   Pertinent  Health Maintenance Due  Topic Date Due  . PNA vac Low Risk Adult (2 of 2 - PPSV23) 03/04/2015  . INFLUENZA VACCINE  Completed  . DEXA SCAN  Completed   Fall Risk  08/12/2015 07/15/2015 02/15/2015 01/07/2015 01/07/2015  Falls in the past year? Yes Yes Yes Yes Yes  Number falls in past yr: 1 1 1 1 1   Injury with Fall? No - - Yes -  Risk Factor Category  - - High Fall Risk High Fall Risk -  Risk for fall due to : History of fall(s);Impaired balance/gait History of fall(s) History of fall(s) History of fall(s) History of fall(s);Impaired balance/gait  Follow up Falls evaluation completed Falls evaluation completed - Falls evaluation  completed Falls evaluation completed   Functional Status Survey:    Vitals:   09/19/16 1431  BP: (!) 161/90  Pulse: 84  Resp: 14  Temp: 98.9 F (37.2 C)  TempSrc: Oral  SpO2: 95%  Weight: 96 lb (43.5 kg)   Body mass index is 17.01 kg/m. Physical Exam  Constitutional: No distress.  Frail white female seated in chair in her room  Cardiovascular: Normal rate, regular rhythm and normal heart sounds.   Pulmonary/Chest: Effort normal and breath sounds normal. No respiratory distress. She has no wheezes.  Abdominal: Soft. Bowel sounds are normal. She exhibits no distension. There is no tenderness.  Musculoskeletal: Normal range of motion.  Uses walker to ambulate  Neurological: She is alert.  Drowsy, falling asleep in chair  Skin: Skin is warm and dry.  Psychiatric: She has a normal mood and affect.    Labs reviewed:  Recent Labs  03/07/16 1100  NA 140  K 4.2  BUN 18  CREATININE 0.6   No results for input(s): AST, ALT, ALKPHOS, BILITOT, PROT, ALBUMIN in the last 8760 hours.  Recent Labs  03/07/16 1100  WBC 6.3  HGB 13.5  HCT 44  PLT 222   Lab Results  Component Value Date   TSH 4.26 11/17/2014  Assessment/Plan 1. Essential hypertension -bps have been elevated and need to be repeated manually for confirmation--may need med adjustment  2. Slow transit constipation -cont current regimen, controls symptoms  3. Gastroesophageal reflux disease with esophagitis -cont zantac  4. Late onset Alzheimer's disease without behavioral disturbance -gradually progressing, in skilled care, no benefit to adding meds at this stage  5. Primary insomnia -is back on ambien 5mg  prn since December it appears, presumably due to the vivid dreams with belsomra low dose  6. Macular degeneration -not on any drops at this time, cont to monitor  7. Protein-calorie malnutrition, severe (HCC) -cont supplement tid prn, failed remeron -needs a lot of cues at meals and likes outside  food  Family/ staff Communication: discussed with snf nurse  Labs/tests ordered:  Needs manual bps if readings over 150/90

## 2016-10-03 DIAGNOSIS — I10 Essential (primary) hypertension: Secondary | ICD-10-CM | POA: Diagnosis not present

## 2016-10-03 LAB — BASIC METABOLIC PANEL
BUN: 17 mg/dL (ref 4–21)
CREATININE: 0.7 mg/dL (ref 0.5–1.1)
Glucose: 77 mg/dL
Potassium: 4.3 mmol/L (ref 3.4–5.3)
SODIUM: 140 mmol/L (ref 137–147)

## 2016-10-23 ENCOUNTER — Encounter: Payer: Self-pay | Admitting: Adult Health

## 2016-10-23 ENCOUNTER — Non-Acute Institutional Stay (SKILLED_NURSING_FACILITY): Payer: Medicare Other | Admitting: Adult Health

## 2016-10-23 DIAGNOSIS — F0280 Dementia in other diseases classified elsewhere without behavioral disturbance: Secondary | ICD-10-CM | POA: Diagnosis not present

## 2016-10-23 DIAGNOSIS — M21372 Foot drop, left foot: Secondary | ICD-10-CM | POA: Diagnosis not present

## 2016-10-23 DIAGNOSIS — J309 Allergic rhinitis, unspecified: Secondary | ICD-10-CM | POA: Diagnosis not present

## 2016-10-23 DIAGNOSIS — M21371 Foot drop, right foot: Secondary | ICD-10-CM

## 2016-10-23 DIAGNOSIS — Z23 Encounter for immunization: Secondary | ICD-10-CM | POA: Diagnosis not present

## 2016-10-23 DIAGNOSIS — M2062 Acquired deformities of toe(s), unspecified, left foot: Secondary | ICD-10-CM

## 2016-10-23 DIAGNOSIS — G301 Alzheimer's disease with late onset: Secondary | ICD-10-CM

## 2016-10-23 DIAGNOSIS — M19011 Primary osteoarthritis, right shoulder: Secondary | ICD-10-CM

## 2016-10-23 DIAGNOSIS — I10 Essential (primary) hypertension: Secondary | ICD-10-CM

## 2016-10-23 DIAGNOSIS — F5101 Primary insomnia: Secondary | ICD-10-CM | POA: Diagnosis not present

## 2016-10-23 DIAGNOSIS — K5901 Slow transit constipation: Secondary | ICD-10-CM | POA: Diagnosis not present

## 2016-10-23 DIAGNOSIS — E43 Unspecified severe protein-calorie malnutrition: Secondary | ICD-10-CM

## 2016-10-23 DIAGNOSIS — H9190 Unspecified hearing loss, unspecified ear: Secondary | ICD-10-CM | POA: Insufficient documentation

## 2016-10-23 DIAGNOSIS — M21379 Foot drop, unspecified foot: Secondary | ICD-10-CM | POA: Insufficient documentation

## 2016-10-23 NOTE — Progress Notes (Signed)
Patient ID: [name], female   DOB: 02-Oct-1916, 83 y.o.   MRN: [id number]   Location:   [place]:  SNF (31) Provider:   Cindi Carbon, [place] [phone]   REED, Jonelle Sidle, DO  Patient Care Team: Gayland Curry, DO as PCP - General (Geriatric Medicine) [facility]  Extended Emergency Contact Information Primary Emergency Contact: Powell,Katherine  [place] Phone: [phone] Mobile Phone: [phone] Relation: Other Secondary Emergency Contact: Thatcher,Shirley Address: [address]  [place] of [place] Phone: [phone] Mobile Phone: [phone] Relation: Other  Code Status:  DNR Goals of care: Advanced Directive information Advanced Directives 09/19/2016  Does Patient Have a Medical Advance Directive? -  Type of Advance Directive Out of facility DNR (pink MOST or yellow form);[place];Living will  Does patient want to make changes to medical advance directive? -  Copy of [place] in Chart? Yes  Would patient like information on creating a medical advance directive? -  Pre-existing out of facility DNR order (yellow form or pink MOST form) Yellow form placed in chart (order not valid for inpatient use)     Chief Complaint  Patient presents with  . Medical Management of Chronic Issues    HPI:  Pt is a 83 y.o. female seen today for medical management of chronic diseases. She resides in skilled care at [facility] due to age, debility, and  memory loss. She has a private caregiver, who is also her POA,  with her each morning. Due to increased weakness, she now uses a stand up lift to for transfers. She has advanced Alz dementia and is not taking any memory medication due to age, debility, and lack of benefit She has become increasingly HOH, despite having hearing aides making it difficult to communicate.    Her weight is down 2 lbs to 95.  She has been refusing nutritional supplements.   Denies difficulty sleeping, currently on ambien 5 mg qhs.  Belsomra was discontinued due to cost. She has pedal edema, not on diuretics. No sob or chest pain. BP range 142-151/83-90 currently on cardizem and lisinopril  Past Medical History:  Diagnosis Date  . Arthritis   . Carpal tunnel syndrome on left   . Diverticulosis   . DJD (degenerative joint disease)   . Essential hypertension 06/29/2013   D/c 'd amlodipine  08/27/2014 due to low bp and relative tachycardia    . GERD (gastroesophageal reflux disease)   . Headache(784.0)   . High blood pressure    echo 09-normal  . Lower GI bleed 06/2013   required 2 units PRBC  . Macular degeneration syndrome   . Pleural effusion 08/07/2014  . Wears glasses   . Wears hearing aid    both  . Wears partial dentures    top and bottom   Past Surgical History:  Procedure Laterality Date  . APPENDECTOMY    . CARPAL TUNNEL RELEASE  2/12   rt  . CARPAL TUNNEL RELEASE  04/23/2012   Procedure: CARPAL TUNNEL RELEASE;  Surgeon: Wynonia Sours, MD;  Location: [facility];  Service: Orthopedics;  Laterality: Left;  . EYE SURGERY     cataractas  . HIP SURGERY     left  . JOINT REPLACEMENT  2006   rt total hip x2  . KNEE SURGERY    . THYROID CYST EXCISION    . TONSILLECTOMY  Allergies  Allergen Reactions  . Celebrex [Celecoxib] Itching  . Morphine And Related Nausea Only  . Vicodin [Hydrocodone-Acetaminophen] Itching  . Sulfa Antibiotics Rash    Allergies as of 10/23/2016      Reactions   Celebrex [celecoxib] Itching   Morphine And Related Nausea Only   Vicodin [hydrocodone-acetaminophen] Itching   Sulfa Antibiotics Rash      Medication List       Accurate as of 10/23/16  3:16 PM. Always use your most recent med list.          acetaminophen 500 MG tablet Commonly known as:  TYLENOL Take 1,000 mg by mouth 3 (three) times  daily.   artificial tears ointment Place 2 drops into the right eye at bedtime.   cetirizine 10 MG tablet Commonly known as:  ZYRTEC Take 10 mg by mouth every morning.   diltiazem 120 MG tablet Commonly known as:  CARDIZEM Take 120 mg by mouth daily.   docusate sodium 100 MG capsule Commonly known as:  COLACE Take 100 mg by mouth 2 (two) times daily.   feeding supplement (ENSURE COMPLETE) Liqd Take 237 mLs by mouth 3 (three) times daily between meals.   fluticasone 27.5 MCG/SPRAY nasal spray Commonly known as:  VERAMYST Place 1 spray into the nose daily.   lisinopril 20 MG tablet Commonly known as:  PRINIVIL,ZESTRIL Take 20 mg by mouth daily.   polyethylene glycol packet Commonly known as:  MIRALAX / GLYCOLAX Take 17 g by mouth every other day.   ranitidine 150 MG capsule Commonly known as:  ZANTAC Take 150 mg by mouth daily as needed for heartburn (gerd).   trolamine salicylate 10 % cream Commonly known as:  ASPERCREME Apply 1 application topically 4 (four) times daily as needed for muscle pain.   vitamin B-12 1000 MCG tablet Commonly known as:  CYANOCOBALAMIN Take 1,000 mcg by mouth daily.   zolpidem 5 MG tablet Commonly known as:  AMBIEN Take 5 mg by mouth at bedtime.       Review of Systems  Constitutional: Negative for activity change, appetite change, chills, diaphoresis, fatigue, fever and unexpected weight change.  HENT: Positive for hearing loss. Negative for congestion, ear discharge, ear pain, postnasal drip, rhinorrhea, sinus pain, tinnitus, trouble swallowing and voice change.   Eyes: Negative for redness and itching.  Respiratory: Negative for apnea, cough, choking, shortness of breath and wheezing.   Cardiovascular: Negative for chest pain, palpitations and leg swelling.  Gastrointestinal: Negative for abdominal distention, abdominal pain, constipation and diarrhea.   Genitourinary: Negative for difficulty urinating and dysuria.   Musculoskeletal: Positive for arthralgias and gait problem. Negative for back pain, joint swelling and myalgias.  Skin: Negative for rash and wound.  Neurological: Positive for weakness. Negative for dizziness, tremors, seizures, syncope, facial asymmetry, speech difficulty, light-headedness, numbness and headaches.  Psychiatric/Behavioral: Positive for sleep disturbance. Negative for agitation, behavioral problems and confusion. The patient is not nervous/anxious.      memory loss    Immunization History  Administered Date(s) Administered  . Influenza Inj Mdck Quad Pf 06/15/2016  . Influenza Split 05/28/2014  . Influenza-Unspecified 06/10/2015  . Pneumococcal Conjugate-13 03/03/2014  . Pneumococcal Polysaccharide-23 04/05/2016   Pertinent  Health Maintenance Due  Topic Date Due  . PNA vac Low Risk Adult (2 of 2 - PPSV23) 03/04/2015  . INFLUENZA VACCINE  Completed  . DEXA SCAN  Completed   Fall Risk  08/12/2015 07/15/2015 02/15/2015 01/07/2015 01/07/2015  Falls in the past year? Yes  Yes Yes Yes Yes  Number falls in past yr: 1 1 1 1 1   Injury with Fall? No - - Yes -  Risk Factor Category  - - High Fall Risk High Fall Risk -  Risk for fall due to : History of fall(s);Impaired balance/gait History of fall(s) History of fall(s) History of fall(s) History of fall(s);Impaired balance/gait  Follow up Falls evaluation completed Falls evaluation completed - Falls evaluation completed Falls evaluation completed   Functional Status Survey:   Wt Readings from Last 3 Encounters:  10/23/16 95 lb (43.1 kg)  09/19/16 96 lb (43.5 kg)  08/17/16 97 lb (44 kg)   Vitals:   10/23/16 1444  Weight: 95 lb (43.1 kg)   Body mass index is 16.83 kg/m. Physical Exam  Constitutional: No distress.  HENT:  Head: Normocephalic and atraumatic.  Right Ear: External ear normal.  Left Ear: External ear normal.  Nose: Nose normal.  Mouth/Throat: Oropharynx is clear and moist. No oropharyngeal exudate.  HOH   Right eye lid entropion noted with small amt of yellow drainage. Left eye exhibits no discharge.  Neck: No JVD present. No thyromegaly present.  Cardiovascular: Normal rate, regular rhythm and normal heart sounds.   No murmur heard. Pulmonary/Chest: Effort normal. No respiratory distress. She has no wheezes. She has no rales.  Abdominal: Soft. Bowel sounds are normal. She exhibits no distension.  Musculoskeletal: She exhibits tenderness (R shoulder tender with passive ROM). . Left great toe with deformity and laxity. Not tender, no swelling.  Also appears to have some foot drop developing bilat R>L Decreased ROM to the right shoulder  Lymphadenopathy:    She has no cervical adenopathy.  Neurological: She is alert.  Oriented to self, situation, place, not time  Skin: Skin is warm and dry. She is not diaphoretic.  Psychiatric: She has a normal mood and affect.  Nursing note and vitals reviewed.   Labs reviewed:  Recent Labs  03/07/16 1100 10/03/16  NA 140 140  K 4.2 4.3  BUN 18 17  CREATININE 0.6 0.7   No results for input(s): AST, ALT, ALKPHOS, BILITOT, PROT, ALBUMIN in the last 8760 hours.  Recent Labs  03/07/16 1100  WBC 6.3  HGB 13.5  HCT 44  PLT 222   Lab Results  Component Value Date   TSH 4.26 11/17/2014   No results found for: HGBA1C No results found for: CHOL, HDL, LDLCALC, LDLDIRECT, TRIG, CHOLHDL  Significant Diagnostic Results in last 30 days:  No results found.  Assessment/Plan 1. Deformity of toe of left foot ? Etiology, does bear weight for stand up lift so will need xray  May have destruction of the joint of unclear cause due to laxity in the left great toe  2. Essential hypertension Controlled Goal <150/90  3. Chronic allergic rhinitis, unspecified seasonality, unspecified trigger Controlled Continue veramyst 1 spray qd and zyrtec 10 mg qd  4. Late onset Alzheimer's disease without behavioral disturbance Progressive memory loss, no  behavioral issues Not able to perform well for MMSE due to sigh/hearing issues Would likely not benefit from namenda  5. Primary osteoarthritis of right shoulder Continue tylenol 1000 mg TID  6. Slow transit constipation Continue miralax 17 grams QD  7. Bilateral foot-drop Noted due disuse Not in pain, would hold off on boot, see #1  8. Protein-calorie malnutrition, severe ([place]) Refusing nutritional supplements Goals of care are comfort based Continue to monitor weight and encourage nutrition   9. Primary insomnia Continue ambien 5 mg  qd  10. Immunization due T dap ordered  Family/ staff Communication: discussed with staff/resident  Labs/tests ordered: NA  Cindi Carbon, [place] [phone]

## 2016-10-24 DIAGNOSIS — M79672 Pain in left foot: Secondary | ICD-10-CM | POA: Diagnosis not present

## 2016-10-25 ENCOUNTER — Non-Acute Institutional Stay (SKILLED_NURSING_FACILITY): Payer: Medicare Other | Admitting: Internal Medicine

## 2016-10-25 ENCOUNTER — Encounter: Payer: Self-pay | Admitting: Internal Medicine

## 2016-10-25 DIAGNOSIS — G301 Alzheimer's disease with late onset: Secondary | ICD-10-CM

## 2016-10-25 DIAGNOSIS — F0280 Dementia in other diseases classified elsewhere without behavioral disturbance: Secondary | ICD-10-CM

## 2016-10-25 DIAGNOSIS — M2062 Acquired deformities of toe(s), unspecified, left foot: Secondary | ICD-10-CM | POA: Diagnosis not present

## 2016-10-25 NOTE — Progress Notes (Signed)
Patient ID: [name], female   DOB: February 19, 1917, 83 y.o.   MRN: [id number]  Location:  [place] Room Number: 136 Place of Service:  SNF (31) Provider:  Jenniferann Stuckert L. Mariea Clonts, D.O., C.M.D.  Hollace Kinnier, DO  Patient Care Team: Gayland Curry, DO as PCP - General (Geriatric Medicine) Well [facility]  Extended Emergency Contact Information Primary Emergency Contact: Powell,Katherine  [place] Phone: [phone] Mobile Phone: [phone] Relation: Other Secondary Emergency Contact: Thatcher,Shirley Address: [address]  [place] Phone: [phone] Mobile Phone: [phone] Relation: Other  Code Status:  DNR Goals of care: Advanced Directive information Advanced Directives 10/25/2016  Does Patient Have a Medical Advance Directive? Yes  Type of Paramedic of [place];Out of facility DNR (pink MOST or yellow form)  Does patient want to make changes to medical advance directive? -  Copy of [place] in Chart? Yes  Would patient like information on creating a medical advance directive? -  Pre-existing out of facility DNR order (yellow form or pink MOST form) Yellow form placed in chart (order not valid for inpatient use)     Chief Complaint  Patient presents with  . Acute Visit    follow-up on left great toe    HPI:  Pt is a 83 y.o. female seen today for an acute visit for f/u on left great toe.  When NP saw pt yesterday and noted that left great toe with deformity and laxity.  She was concerned about fracture b/c it appeared her toe was going to become detached from her foot and mobile xrays were ordered.  The xrays were negative for acute fracture of dislocation, no osteomyelitis.  Severe osteoporosis and moderate OA were noted.     Past Medical History:  Diagnosis Date  . Arthritis   . Carpal tunnel syndrome on left   .  Diverticulosis   . DJD (degenerative joint disease)   . Essential hypertension 06/29/2013   D/c 'd amlodipine  08/27/2014 due to low bp and relative tachycardia    . GERD (gastroesophageal reflux disease)   . Headache(784.0)   . High blood pressure    echo 09-normal  . Lower GI bleed 06/2013   required 2 units PRBC  . Macular degeneration syndrome   . Pleural effusion 08/07/2014  . Wears glasses   . Wears hearing aid    both  . Wears partial dentures    top and bottom   Past Surgical History:  Procedure Laterality Date  . APPENDECTOMY    . CARPAL TUNNEL RELEASE  2/12   rt  . CARPAL TUNNEL RELEASE  04/23/2012   Procedure: CARPAL TUNNEL RELEASE;  Surgeon: Wynonia Sours, MD;  Location: [place];  Service: Orthopedics;  Laterality: Left;  . EYE SURGERY     cataractas  . HIP SURGERY     left  . JOINT REPLACEMENT  2006   rt total hip x2  . KNEE SURGERY    . THYROID CYST EXCISION    . TONSILLECTOMY      Allergies  Allergen Reactions  . Celebrex [Celecoxib] Itching  . Morphine And Related Nausea Only  . Vicodin [Hydrocodone-Acetaminophen] Itching  . Sulfa Antibiotics Rash    Allergies as of 10/25/2016      Reactions   Celebrex [celecoxib] Itching   Morphine And Related Nausea Only   Vicodin [hydrocodone-acetaminophen] Itching   Sulfa Antibiotics Rash  Medication List       Accurate as of 10/25/16  9:43 AM. Always use your most recent med list.          acetaminophen 500 MG tablet Commonly known as:  TYLENOL Take 1,000 mg by mouth 3 (three) times daily.   artificial tears ointment Place 2 drops into the right eye at bedtime.   cetirizine 10 MG tablet Commonly known as:  ZYRTEC Take 10 mg by mouth every morning.   diltiazem 120 MG tablet Commonly known as:  CARDIZEM Take 120 mg by mouth daily.   docusate sodium 100 MG capsule Commonly known as:  COLACE Take 100 mg by mouth 2 (two) times daily.   fluticasone 27.5 MCG/SPRAY nasal  spray Commonly known as:  VERAMYST Place 1 spray into the nose daily.   lisinopril 20 MG tablet Commonly known as:  PRINIVIL,ZESTRIL Take 20 mg by mouth daily.   polyethylene glycol packet Commonly known as:  MIRALAX / GLYCOLAX Take 17 g by mouth every other day.   ranitidine 150 MG capsule Commonly known as:  ZANTAC Take 150 mg by mouth daily as needed for heartburn (gerd).   trolamine salicylate 10 % cream Commonly known as:  ASPERCREME Apply 1 application topically 4 (four) times daily as needed for muscle pain.   vitamin B-12 1000 MCG tablet Commonly known as:  CYANOCOBALAMIN Take 1,000 mcg by mouth daily.   zolpidem 5 MG tablet Commonly known as:  AMBIEN Take 5 mg by mouth at bedtime.       Review of Systems  Constitutional: Positive for fatigue.  HENT: Positive for hearing loss. Negative for congestion.   Eyes: Positive for visual disturbance.  Respiratory: Negative for shortness of breath.   Cardiovascular: Negative for chest pain, palpitations and leg swelling.  Gastrointestinal: Positive for constipation.  Genitourinary: Negative for dysuria.       Incontinence  Musculoskeletal: Positive for arthralgias, back pain and gait problem.  Neurological: Positive for weakness. Negative for dizziness.  Psychiatric/Behavioral: Positive for confusion and sleep disturbance.    Immunization History  Administered Date(s) Administered  . Influenza Inj Mdck Quad Pf 06/15/2016  . Influenza Split 05/28/2014  . Influenza-Unspecified 06/10/2015  . Pneumococcal Conjugate-13 03/03/2014  . Pneumococcal Polysaccharide-23 04/05/2016   Pertinent  Health Maintenance Due  Topic Date Due  . INFLUENZA VACCINE  Completed  . DEXA SCAN  Completed  . PNA vac Low Risk Adult  Completed   Fall Risk  08/12/2015 07/15/2015 02/15/2015 01/07/2015 01/07/2015  Falls in the past year? Yes Yes Yes Yes Yes  Number falls in past yr: 1 1 1 1 1   Injury with Fall? No - - Yes -  Risk Factor  Category  - - High Fall Risk High Fall Risk -  Risk for fall due to : History of fall(s);Impaired balance/gait History of fall(s) History of fall(s) History of fall(s) History of fall(s);Impaired balance/gait  Follow up Falls evaluation completed Falls evaluation completed - Falls evaluation completed Falls evaluation completed   Functional Status Survey:   She resides in skilled care at [facility] due to age, debility, and  memory loss. She has a private caregiver, who is also her POA,  with her each morning. Due to increased weakness, she now uses a stand up lift to for transfers  Vitals:   10/25/16 0940  BP: (!) 142/90  Pulse: 83  Resp: 20  Temp: 97.3 F (36.3 C)  TempSrc: Oral  SpO2: 96%   There is no height or  weight on file to calculate BMI. Physical Exam  Constitutional: No distress.  HENT:  Head: Normocephalic and atraumatic.  Hearing aides  Eyes:  glasses  Cardiovascular: Normal rate, regular rhythm, normal heart sounds and intact distal pulses.   Pulmonary/Chest: Effort normal and breath sounds normal. No respiratory distress.  Abdominal: Soft. Bowel sounds are normal.  Musculoskeletal:  Flaccid left great toe  Neurological: She is alert.  Right foot drop  Skin: Skin is warm and dry.  Psychiatric:  Flat affect    Labs reviewed:  Recent Labs  03/07/16 1100 10/03/16  NA 140 140  K 4.2 4.3  BUN 18 17  CREATININE 0.6 0.7   No results for input(s): AST, ALT, ALKPHOS, BILITOT, PROT, ALBUMIN in the last 8760 hours.  Recent Labs  03/07/16 1100  WBC 6.3  HGB 13.5  HCT 44  PLT 222   Lab Results  Component Value Date   TSH 4.26 11/17/2014   Assessment/Plan 1. Deformity of toe of left foot -appearance of toe certainly is concerning for fracture, but does not seem to bother her and xrays negative for fracture, osteomyelitis or dislocation -will continue to monitor  2. Late onset Alzheimer's disease without behavioral disturbance -not on medication at  this stage of her disease, living in skilled care and needing adl assistance -cont supportive care  Family/ staff Communication: discussed with SNF nurse and NP  Labs/tests ordered:  No new

## 2016-11-16 ENCOUNTER — Non-Acute Institutional Stay (SKILLED_NURSING_FACILITY): Payer: Medicare Other | Admitting: Adult Health

## 2016-11-16 ENCOUNTER — Encounter: Payer: Self-pay | Admitting: Adult Health

## 2016-11-16 DIAGNOSIS — G301 Alzheimer's disease with late onset: Secondary | ICD-10-CM

## 2016-11-16 DIAGNOSIS — F5101 Primary insomnia: Secondary | ICD-10-CM

## 2016-11-16 DIAGNOSIS — I10 Essential (primary) hypertension: Secondary | ICD-10-CM

## 2016-11-16 DIAGNOSIS — F0280 Dementia in other diseases classified elsewhere without behavioral disturbance: Secondary | ICD-10-CM

## 2016-11-16 DIAGNOSIS — E43 Unspecified severe protein-calorie malnutrition: Secondary | ICD-10-CM

## 2016-11-20 NOTE — Progress Notes (Signed)
Patient ID: [name], female   DOB: 02-May-1917, 83 y.o.   MRN: [id number]   Location:   [place]:  SNF (31) Provider:   Cindi Carbon, [place] [phone]   REED, Jonelle Sidle, DO  Patient Care Team: Gayland Curry, DO as PCP - General (Geriatric Medicine) Well [facility]  Extended Emergency Contact Information Primary Emergency Contact: Powell,Katherine  [place] Phone: [phone] Mobile Phone: [phone] Relation: Other Secondary Emergency Contact: Thatcher,Shirley Address: [address]  [place] of [place] Phone: [phone] Mobile Phone: [phone] Relation: Other  Code Status:  DNR Goals of care: Advanced Directive information Advanced Directives 10/25/2016  Does Patient Have a Medical Advance Directive? Yes  Type of Paramedic of [place];Out of facility DNR (pink MOST or yellow form)  Does patient want to make changes to medical advance directive? -  Copy of [place] in Chart? Yes  Would patient like information on creating a medical advance directive? -  Pre-existing out of facility DNR order (yellow form or pink MOST form) Yellow form placed in chart (order not valid for inpatient use)     Chief Complaint  Patient presents with  . Medical Management of Chronic Issues    HPI:  Pt is a 83 y.o. female seen today for medical management of chronic diseases. She resides in skilled care at [facility] due to age, debility, and  memory loss. She has a private caregiver, who is also her POA,  with her each morning. Due to increased weakness, she now uses a stand up lift to for transfers. She has advanced Alz dementia and is not taking any memory medication due to age, debility, and lack of benefit  BP range 145-176/75-89 currently on cardizem and lisinopril.  Has pedal edema, did not tolerate hctz in  the past (electrolyte imbalance)  Insomnia: on ambien 5 mg qhs, sleeps well without s/e  She no longer takes nutritional supplements because she states that she gained a few lbs and does not want to gain any more. She is up to 98 lbs, 3 lb gain from last month.  She was on Remeron for sleep/appetite but this was stopped due to reports of nightmares.   Past Medical History:  Diagnosis Date  . Arthritis   . Carpal tunnel syndrome on left   . Diverticulosis   . DJD (degenerative joint disease)   . Essential hypertension 06/29/2013   D/c 'd amlodipine  08/27/2014 due to low bp and relative tachycardia    . GERD (gastroesophageal reflux disease)   . Headache(784.0)   . High blood pressure    echo 09-normal  . Lower GI bleed 06/2013   required 2 units PRBC  . Macular degeneration syndrome   . Pleural effusion 08/07/2014  . Wears glasses   . Wears hearing aid    both  . Wears partial dentures    top and bottom   Past Surgical History:  Procedure Laterality Date  . APPENDECTOMY    . CARPAL TUNNEL RELEASE  2/12   rt  . CARPAL TUNNEL RELEASE  04/23/2012   Procedure: CARPAL TUNNEL RELEASE;  Surgeon: Wynonia Sours, MD;  Location: [place];  Service: Orthopedics;  Laterality: Left;  . EYE SURGERY     cataractas  . HIP SURGERY     left  . JOINT REPLACEMENT  2006   rt total hip x2  .  KNEE SURGERY    . THYROID CYST EXCISION    . TONSILLECTOMY      Allergies  Allergen Reactions  . Celebrex [Celecoxib] Itching  . Morphine And Related Nausea Only  . Vicodin [Hydrocodone-Acetaminophen] Itching  . Sulfa Antibiotics Rash    Allergies as of 11/16/2016      Reactions   Celebrex [celecoxib] Itching   Morphine And Related Nausea Only   Vicodin [hydrocodone-acetaminophen] Itching   Sulfa Antibiotics Rash      Medication List       Accurate as of 11/16/16 11:59 PM. Always use your most recent med list.          acetaminophen 500 MG tablet Commonly known as:   TYLENOL Take 1,000 mg by mouth 3 (three) times daily.   artificial tears ointment Place 2 drops into the right eye at bedtime.   cetirizine 10 MG tablet Commonly known as:  ZYRTEC Take 10 mg by mouth every morning.   diltiazem 120 MG tablet Commonly known as:  CARDIZEM Take 120 mg by mouth daily.   docusate sodium 100 MG capsule Commonly known as:  COLACE Take 100 mg by mouth 2 (two) times daily.   fluticasone 27.5 MCG/SPRAY nasal spray Commonly known as:  VERAMYST Place 1 spray into the nose daily.   lisinopril 20 MG tablet Commonly known as:  PRINIVIL,ZESTRIL Take 20 mg by mouth daily.   polyethylene glycol packet Commonly known as:  MIRALAX / GLYCOLAX Take 17 g by mouth every other day.   ranitidine 150 MG capsule Commonly known as:  ZANTAC Take 150 mg by mouth daily as needed for heartburn (gerd).   trolamine salicylate 10 % cream Commonly known as:  ASPERCREME Apply 1 application topically 4 (four) times daily as needed for muscle pain.   vitamin B-12 1000 MCG tablet Commonly known as:  CYANOCOBALAMIN Take 1,000 mcg by mouth daily.   zolpidem 5 MG tablet Commonly known as:  AMBIEN Take 5 mg by mouth at bedtime.       Review of Systems  Constitutional: Negative for chills, diaphoresis, fever, malaise/fatigue and weight loss.  Respiratory: Negative for cough, hemoptysis, sputum production and wheezing.   Cardiovascular: Positive for leg swelling (pedal). Negative for chest pain and PND.  Gastrointestinal: Negative for abdominal pain, constipation, diarrhea, nausea and vomiting.  Genitourinary: Negative for dysuria and urgency.  Musculoskeletal: Positive for joint pain. Negative for back pain, falls, myalgias and neck pain.  Skin: Negative for itching and rash.  Neurological: Negative for dizziness, tremors, focal weakness and weakness.  Psychiatric/Behavioral: Positive for memory loss. Negative for depression and hallucinations. The patient is not  nervous/anxious and does not have insomnia.      Immunization History  Administered Date(s) Administered  . Influenza Inj Mdck Quad Pf 06/15/2016  . Influenza Split 05/28/2014  . Influenza-Unspecified 06/10/2015  . Pneumococcal Conjugate-13 03/03/2014  . Pneumococcal Polysaccharide-23 04/05/2016  . Td 10/23/2016   Pertinent  Health Maintenance Due  Topic Date Due  . INFLUENZA VACCINE  Completed  . DEXA SCAN  Completed  . PNA vac Low Risk Adult  Completed   Fall Risk  08/12/2015 07/15/2015 02/15/2015 01/07/2015 01/07/2015  Falls in the past year? Yes Yes Yes Yes Yes  Number falls in past yr: 1 1 1 1 1   Injury with Fall? No - - Yes -  Risk Factor Category  - - High Fall Risk High Fall Risk -  Risk for fall due to : History of fall(s);Impaired balance/gait History  of fall(s) History of fall(s) History of fall(s) History of fall(s);Impaired balance/gait  Follow up Falls evaluation completed Falls evaluation completed - Falls evaluation completed Falls evaluation completed   Functional Status Survey:   Wt Readings from Last 3 Encounters:  11/16/16 98 lb 6.4 oz (44.6 kg)  10/23/16 95 lb (43.1 kg)  09/19/16 96 lb (43.5 kg)   Vitals:   11/16/16 1441  BP: (!) 176/88  Pulse: 83  Resp: 18  Temp: 98.1 F (36.7 C)  SpO2: 96%  Weight: 98 lb 6.4 oz (44.6 kg)   Body mass index is 17.43 kg/m.  Physical Exam  Constitutional: No distress.  Frail, thin  HENT:  Head: Normocephalic and atraumatic.  HOH  Neck: No JVD present.  Cardiovascular: Normal rate and regular rhythm.   No murmur heard. bilat pedal edema  Pulmonary/Chest: Effort normal and breath sounds normal. No respiratory distress. She has no wheezes.  Abdominal: Soft. Bowel sounds are normal. She exhibits no distension.  Neurological: She is alert.  Oriented to self, place, situation, not time.   Skin: Skin is warm and dry. She is not diaphoretic.  Psychiatric: She has a normal mood and affect.    Labs  reviewed:  Recent Labs  03/07/16 1100 10/03/16  NA 140 140  K 4.2 4.3  BUN 18 17  CREATININE 0.6 0.7   No results for input(s): AST, ALT, ALKPHOS, BILITOT, PROT, ALBUMIN in the last 8760 hours.  Recent Labs  03/07/16 1100  WBC 6.3  HGB 13.5  HCT 44  PLT 222   Lab Results  Component Value Date   TSH 4.26 11/17/2014   No results found for: HGBA1C No results found for: CHOL, HDL, LDLCALC, LDLDIRECT, TRIG, CHOLHDL  Significant Diagnostic Results in last 30 days:  No results found.  Assessment/Plan  1. Essential hypertension Trend upward, goal would be <150/90 Could re try Norvasc but given her age and debility will hold off for now and follow up next month Staff to monitor BP weekly  2. Late onset Alzheimer's disease without behavioral disturbance Worsening due to hearing loss (has tried different hearing aides) Could use amplifier, or stethoscope from nurse  3. Protein-calorie malnutrition, severe ([place]) Unfortunately she is longer willing to take supplements Encourage oral intake and monitor weight  4. Primary insomnia Continue Ambien 5 mg qhs  Family/ staff Communication: discussed with staff/resident  Labs/tests ordered: NA  Cindi Carbon, ANP [facility] [phone]

## 2016-12-04 IMAGING — CT CT CHEST W/O CM
1 of 2 series · 14 of 32 positions shown, 18 images · non-contrast
Comparison: 08/06/2014

CLINICAL DATA: Loculated pleural effusion, shortness of breath.

EXAM:
CT CHEST WITHOUT CONTRAST
TECHNIQUE: Multidetector CT imaging of the chest was performed following the
standard protocol without IV contrast..

[Series 2: rtn chest without st · axial · non-contrast · 0.64mm/px · z∈[-269,-4]mm · 14 of 63 slices shown, 18 images]
[im 5/63  mediastinal]
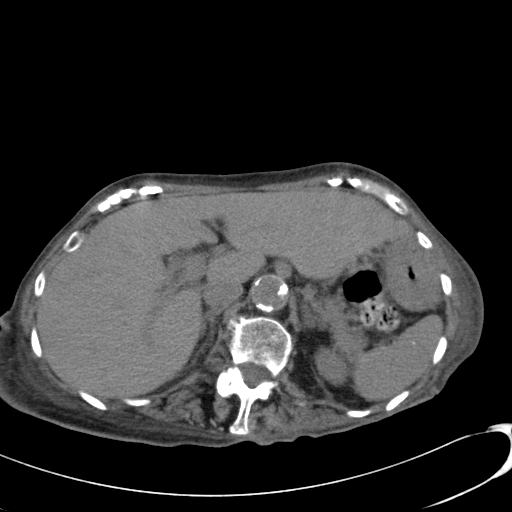
[im 5/63  lung]
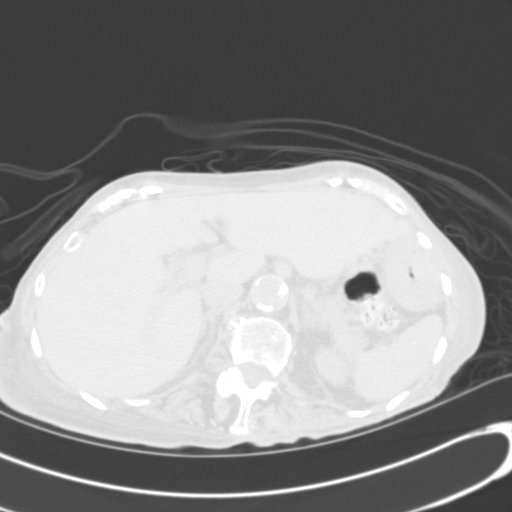
[im 10/63  lung]
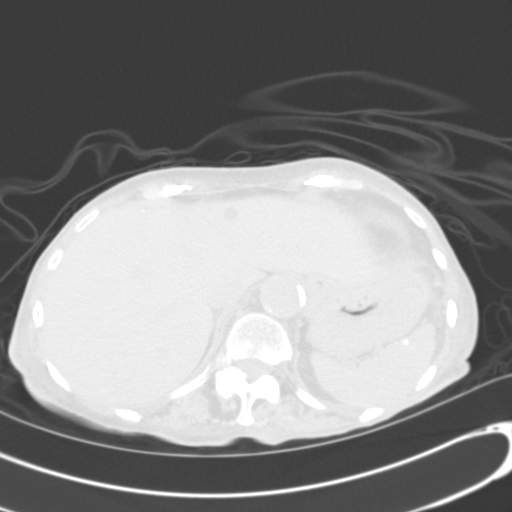
[im 15/63  lung]
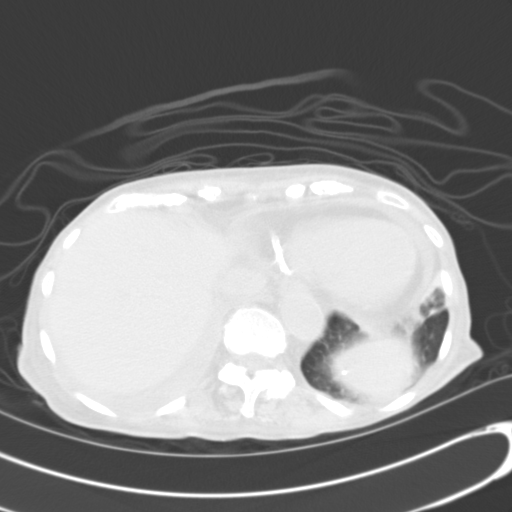
[im 20/63  lung]
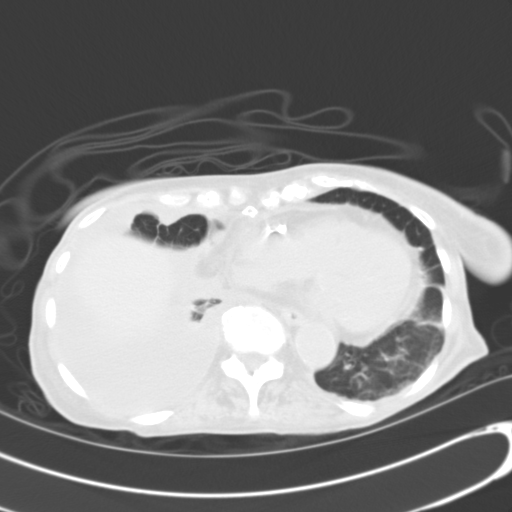
[im 24/63  mediastinal]
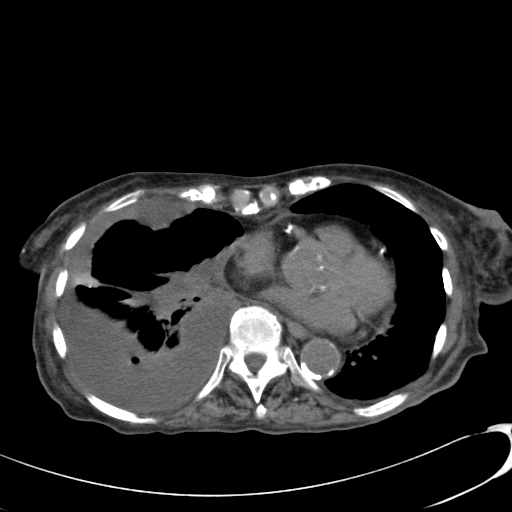
[im 24/63  lung]
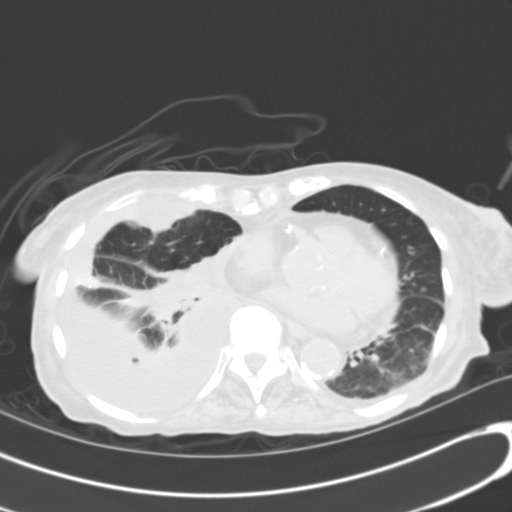
[im 29/63  lung]
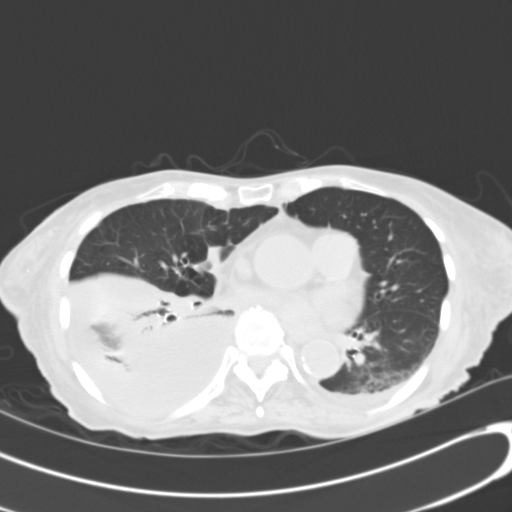
[im 30/63  lung]
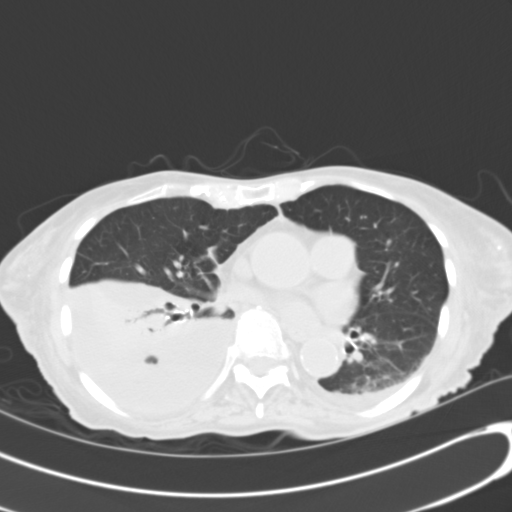
[im 32/63  lung]
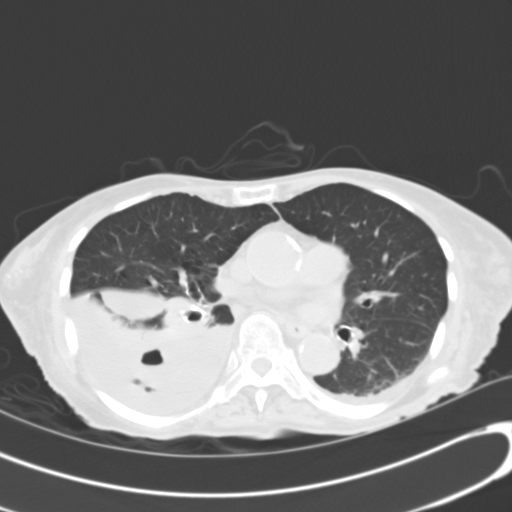
[im 34/63  mediastinal]
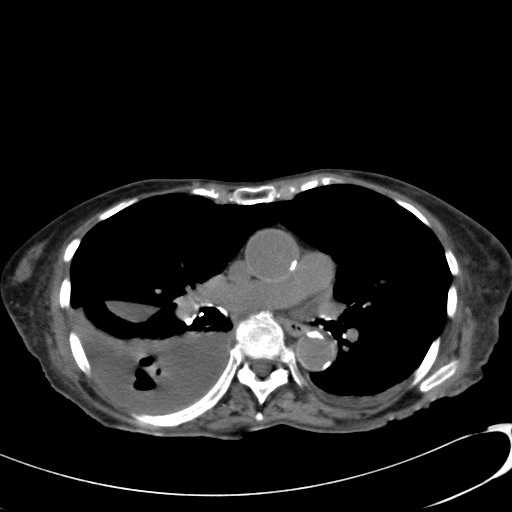
[im 34/63  lung]
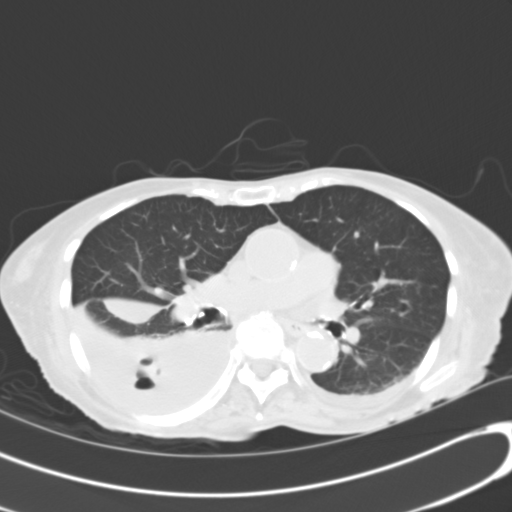
[im 39/63  lung]
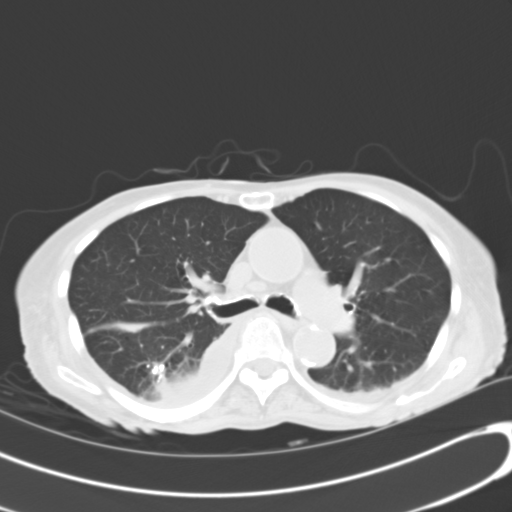
[im 43/63  lung]
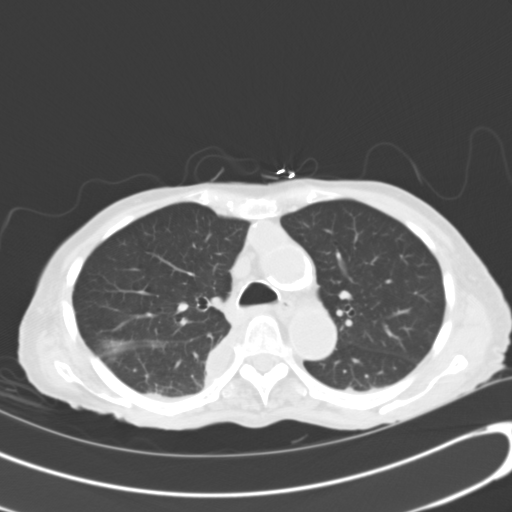
[im 48/63  lung]
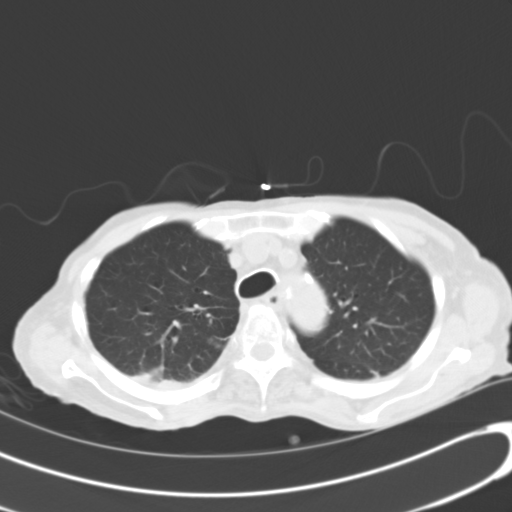
[im 53/63  mediastinal]
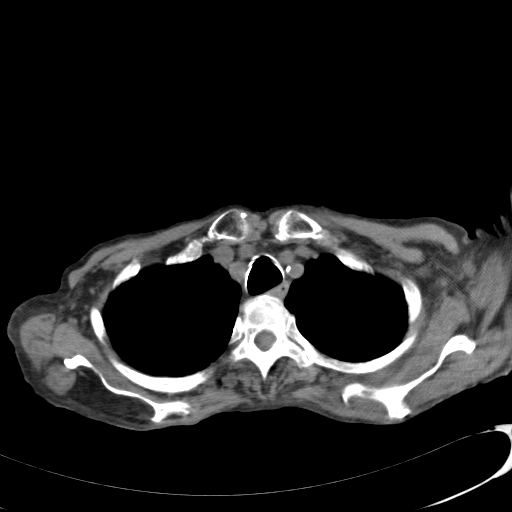
[im 53/63  lung]
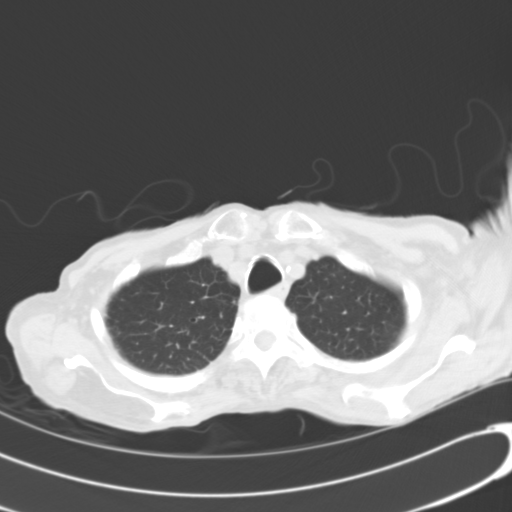
[im 58/63  lung]
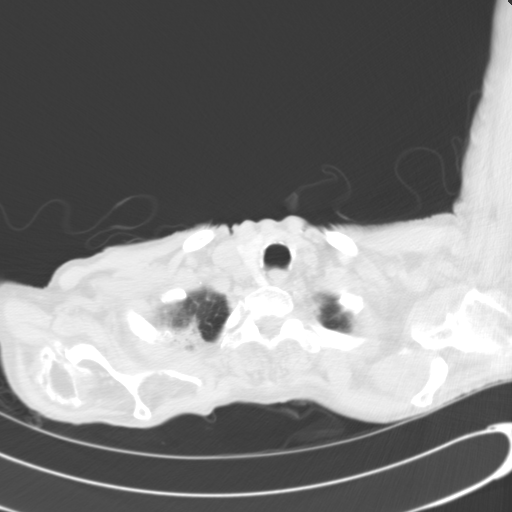

[14 of 32 positions shown; findings below may reference images not displayed]

FINDINGS: The moderate loculated pleural effusion is again noted, minimally
changed in overall size. There are locules of air now within the
pleural space posteriorly. There has been recent thoracentesis.
These may have been introduced during thoracentesis. This can also
be seen with bronchopleural fistula.

Trace left pleural effusion, new since prior study. Airspace disease
with air bronchograms in the right lower lobe, similar to prior
study, likely compressive atelectasis. Dependent atelectasis
posteriorly in the left lower lobe, slightly improved since prior
study.

There is cardiomegaly. Densely calcified coronary arteries noted.
Tortuosity and ectasia of the thoracic aorta. Small scattered
mediastinal lymph nodes, none pathologically enlarged. No axillary
adenopathy.

Chest wall soft tissues are unremarkable. Imaging into the upper
abdomen shows no acute findings. Gallstones noted within the
gallbladder. Calcifications in the spleen compatible with old
granulomatous disease.
IMPRESSION: No significant change in the size of the multiloculated right
pleural effusion. Locules of air are now present within the pleural
space and may be related to recent thoracentesis. Bronchopleural
fistula could have this appearance. However, given the recent
thoracentesis, this is felt less likely.

Atelectasis in both lower lobes, right greater than left.

New trace left pleural effusion.

Cardiomegaly.  Old granulomatous disease.

## 2016-12-11 ENCOUNTER — Encounter: Payer: Self-pay | Admitting: Adult Health

## 2016-12-11 ENCOUNTER — Non-Acute Institutional Stay (SKILLED_NURSING_FACILITY): Payer: Medicare Other | Admitting: Adult Health

## 2016-12-11 DIAGNOSIS — R319 Hematuria, unspecified: Secondary | ICD-10-CM | POA: Diagnosis not present

## 2016-12-11 DIAGNOSIS — D649 Anemia, unspecified: Secondary | ICD-10-CM | POA: Diagnosis not present

## 2016-12-11 DIAGNOSIS — I10 Essential (primary) hypertension: Secondary | ICD-10-CM | POA: Diagnosis not present

## 2016-12-11 DIAGNOSIS — R410 Disorientation, unspecified: Secondary | ICD-10-CM | POA: Diagnosis not present

## 2016-12-11 DIAGNOSIS — N390 Urinary tract infection, site not specified: Secondary | ICD-10-CM | POA: Diagnosis not present

## 2016-12-11 LAB — CBC AND DIFFERENTIAL
HCT: 46 % (ref 36–46)
Hemoglobin: 14.8 g/dL (ref 12.0–16.0)
PLATELETS: 247 10*3/uL (ref 150–399)
WBC: 10.3 10^3/mL

## 2016-12-11 LAB — BASIC METABOLIC PANEL
BUN: 14 mg/dL (ref 4–21)
CREATININE: 0.6 mg/dL (ref 0.5–1.1)
Glucose: 99 mg/dL
POTASSIUM: 3.8 mmol/L (ref 3.4–5.3)
SODIUM: 142 mmol/L (ref 137–147)

## 2016-12-11 LAB — HEPATIC FUNCTION PANEL
ALT: 11 U/L (ref 7–35)
AST: 19 U/L (ref 13–35)
Alkaline Phosphatase: 79 U/L (ref 25–125)
Bilirubin, Total: 0.3 mg/dL

## 2016-12-11 NOTE — Progress Notes (Signed)
Location:  Occupational psychologist of Service:  SNF (31) Provider:   Cindi Carbon, ANP [place] [phone]   REED, Jonelle Sidle, DO  Patient Care Team: Gayland Curry, DO as PCP - General (Geriatric Medicine) [facility]  Extended Emergency Contact Information Primary Emergency Contact: Powell,Katherine  [place] Phone: [phone] Mobile Phone: [phone] Relation: Other Secondary Emergency Contact: Thatcher,Shirley Address: [address]  [place] of [place] Phone: [phone] Mobile Phone: [phone] Relation: Other  Code Status:  DNR Goals of care: Advanced Directive information Advanced Directives 10/25/2016  Does Patient Have a Medical Advance Directive? Yes  Type of Paramedic of [place];Out of facility DNR (pink MOST or yellow form)  Does patient want to make changes to medical advance directive? -  Copy of [place] in Chart? Yes  Would patient like information on creating a medical advance directive? -  Pre-existing out of facility DNR order (yellow form or pink MOST form) Yellow form placed in chart (order not valid for inpatient use)     Chief Complaint  Patient presents with  . Acute Visit    delerium elevated BP    HPI:  Pt is a 83 y.o. female seen today for an acute visit for increased confusion and elevated BP. For the past 24 hrs Ms. Corp has been agitated, kicking and hitting at staff, and refusing pills. She did not take her BP meds this am and her BP was 174/114, pulse 108. She did not eat breakfast. She keeps saying that her friend Danton Clap lives next door and she needs to check on her which is not the case. Overall Ms. Decarlo has had a functional decline over the past year as she is no longer ambulatory and also has confusion from dementia. However, her mentation for my visit is a significant change from  baseline.  Her room has a very strong,sulfuric smell and it was found that a plant was rotting in her room.  She is denies any dizziness, sob, chest pain, etc. Her caregiver feels "dizzy" from sitting in her room with the smell. Once the plant was removed the smell subsided.     Past Medical History:  Diagnosis Date  . Arthritis   . Carpal tunnel syndrome on left   . Diverticulosis   . DJD (degenerative joint disease)   . Essential hypertension 06/29/2013   D/c 'd amlodipine  08/27/2014 due to low bp and relative tachycardia    . GERD (gastroesophageal reflux disease)   . Headache(784.0)   . High blood pressure    echo 09-normal  . Lower GI bleed 06/2013   required 2 units PRBC  . Macular degeneration syndrome   . Pleural effusion 08/07/2014  . Wears glasses   . Wears hearing aid    both  . Wears partial dentures    top and bottom   Past Surgical History:  Procedure Laterality Date  . APPENDECTOMY    . CARPAL TUNNEL RELEASE  2/12   rt  . CARPAL TUNNEL RELEASE  04/23/2012   Procedure: CARPAL TUNNEL RELEASE;  Surgeon: Wynonia Sours, MD;  Location: [place];  Service: Orthopedics;  Laterality: Left;  . EYE SURGERY     cataractas  . HIP SURGERY     left  . JOINT REPLACEMENT  2006   rt total hip x2  . KNEE SURGERY    . THYROID CYST EXCISION    .  TONSILLECTOMY      Allergies  Allergen Reactions  . Celebrex [Celecoxib] Itching  . Morphine And Related Nausea Only  . Vicodin [Hydrocodone-Acetaminophen] Itching  . Sulfa Antibiotics Rash    Outpatient Encounter Prescriptions as of 12/11/2016  Medication Sig  . acetaminophen (TYLENOL) 500 MG tablet Take 1,000 mg by mouth 3 (three) times daily.   . Artificial Tear Ointment (ARTIFICIAL TEARS) ointment Place 2 drops into the right eye at bedtime.  . cetirizine (ZYRTEC) 10 MG tablet Take 10 mg by mouth every morning.   . diltiazem (CARDIZEM) 120 MG tablet Take 120 mg by mouth daily.  Marland Kitchen docusate sodium (COLACE)  100 MG capsule Take 100 mg by mouth 2 (two) times daily.  . fluticasone (VERAMYST) 27.5 MCG/SPRAY nasal spray Place 1 spray into the nose daily.  Marland Kitchen lisinopril (PRINIVIL,ZESTRIL) 20 MG tablet Take 20 mg by mouth daily.  . polyethylene glycol (MIRALAX / GLYCOLAX) packet Take 17 g by mouth every other day.   . ranitidine (ZANTAC) 150 MG capsule Take 150 mg by mouth daily as needed for heartburn (gerd).  . trolamine salicylate (ASPERCREME) 10 % cream Apply 1 application topically 4 (four) times daily as needed for muscle pain.  . vitamin B-12 (CYANOCOBALAMIN) 1000 MCG tablet Take 1,000 mcg by mouth daily.   Marland Kitchen zolpidem (AMBIEN) 5 MG tablet Take 5 mg by mouth at bedtime.   No facility-administered encounter medications on file as of 12/11/2016.     Review of Systems  Constitutional: Positive for appetite change. Negative for activity change, chills, diaphoresis, fatigue, fever and unexpected weight change.  HENT: Negative for congestion, sinus pressure and sore throat.   Respiratory: Negative for cough, shortness of breath and wheezing.   Cardiovascular: Positive for leg swelling. Negative for chest pain and palpitations.  Gastrointestinal: Negative for abdominal distention, abdominal pain, constipation and diarrhea.  Genitourinary: Negative for difficulty urinating and dysuria.  Musculoskeletal: Positive for arthralgias and gait problem. Negative for back pain, joint swelling and myalgias.  Skin: Negative for wound.  Neurological: Negative for dizziness, tremors, seizures, syncope, facial asymmetry, speech difficulty, weakness, light-headedness, numbness and headaches.  Psychiatric/Behavioral: Positive for agitation, behavioral problems and confusion.       Delusions    Immunization History  Administered Date(s) Administered  . Influenza Inj Mdck Quad Pf 06/15/2016  . Influenza Split 05/28/2014  . Influenza-Unspecified 06/10/2015  . Pneumococcal Conjugate-13 03/03/2014  . Pneumococcal  Polysaccharide-23 04/05/2016  . Td 10/23/2016   Pertinent  Health Maintenance Due  Topic Date Due  . INFLUENZA VACCINE  03/28/2017  . DEXA SCAN  Completed  . PNA vac Low Risk Adult  Completed   Fall Risk  08/12/2015 07/15/2015 02/15/2015 01/07/2015 01/07/2015  Falls in the past year? Yes Yes Yes Yes Yes  Number falls in past yr: 1 1 1 1 1   Injury with Fall? No - - Yes -  Risk Factor Category  - - High Fall Risk High Fall Risk -  Risk for fall due to : History of fall(s);Impaired balance/gait History of fall(s) History of fall(s) History of fall(s) History of fall(s);Impaired balance/gait  Follow up Falls evaluation completed Falls evaluation completed - Falls evaluation completed Falls evaluation completed   Functional Status Survey:    Vitals:   12/11/16 1250  Weight: 95 lb 1.6 oz (43.1 kg)   Body mass index is 16.85 kg/m. Physical Exam  Constitutional: No distress.  HENT:  Head: Normocephalic and atraumatic.  Mouth/Throat: No oropharyngeal exudate.  Eyes: Conjunctivae are normal.  Pupils are equal, round, and reactive to light. Right eye exhibits no discharge. Left eye exhibits no discharge.  Neck: No JVD present.  Cardiovascular: Regular rhythm.   No murmur heard. Regular with occas skipped beats, rate 108  Pulmonary/Chest: Effort normal and breath sounds normal. No respiratory distress. She has no wheezes.  Abdominal: Soft. Bowel sounds are normal. She exhibits no distension. There is no tenderness.  Musculoskeletal: She exhibits no edema or tenderness.  Neurological: She is alert. No cranial nerve deficit.  Oriented to self only, able to f/c  Skin: Skin is warm and dry. She is not diaphoretic.  Psychiatric: She has a normal mood and affect.  Nursing note and vitals reviewed.   Labs reviewed:  Recent Labs  03/07/16 1100 10/03/16  NA 140 140  K 4.2 4.3  BUN 18 17  CREATININE 0.6 0.7   No results for input(s): AST, ALT, ALKPHOS, BILITOT, PROT, ALBUMIN in the last  8760 hours.  Recent Labs  03/07/16 1100  WBC 6.3  HGB 13.5  HCT 44  PLT 222   Lab Results  Component Value Date   TSH 4.26 11/17/2014   No results found for: HGBA1C No results found for: CHOL, HDL, LDLCALC, LDLDIRECT, TRIG, CHOLHDL  Significant Diagnostic Results in last 30 days:  No results found.  Assessment/Plan 1. Delirium Most likely due to underlying infection or progression of dementia Will rule out infection/dehydration, no signs of focal deficit Has stable VS and does not appear toxic  2. Uncontrolled hypertension Due to refusing  meds Bp reduced after she agreed to take normally scheduled meds Continue to monitor and current regimen   Family/ staff Communication: discussed with staff  Labs/tests ordered:  UA C and S, BMP CBC

## 2016-12-12 ENCOUNTER — Non-Acute Institutional Stay (SKILLED_NURSING_FACILITY): Payer: Medicare Other | Admitting: Internal Medicine

## 2016-12-12 ENCOUNTER — Encounter: Payer: Self-pay | Admitting: Internal Medicine

## 2016-12-12 DIAGNOSIS — F0281 Dementia in other diseases classified elsewhere with behavioral disturbance: Secondary | ICD-10-CM | POA: Diagnosis not present

## 2016-12-12 DIAGNOSIS — H903 Sensorineural hearing loss, bilateral: Secondary | ICD-10-CM

## 2016-12-12 DIAGNOSIS — E43 Unspecified severe protein-calorie malnutrition: Secondary | ICD-10-CM | POA: Diagnosis not present

## 2016-12-12 DIAGNOSIS — Z7729 Contact with and (suspected ) exposure to other hazardous substances: Secondary | ICD-10-CM

## 2016-12-12 DIAGNOSIS — G301 Alzheimer's disease with late onset: Secondary | ICD-10-CM | POA: Diagnosis not present

## 2016-12-12 DIAGNOSIS — F02818 Dementia in other diseases classified elsewhere, unspecified severity, with other behavioral disturbance: Secondary | ICD-10-CM

## 2016-12-12 DIAGNOSIS — H353230 Exudative age-related macular degeneration, bilateral, stage unspecified: Secondary | ICD-10-CM | POA: Diagnosis not present

## 2016-12-12 DIAGNOSIS — R410 Disorientation, unspecified: Secondary | ICD-10-CM | POA: Diagnosis not present

## 2016-12-12 NOTE — Progress Notes (Signed)
Patient ID: [name], female   DOB: 27-Oct-1916, 83 y.o.   MRN: [id number]  Location:  [place] Room Number: 136 Place of Service:  SNF [phone]) Provider:   Gayland Curry, DO  Patient Care Team: Gayland Curry, DO as PCP - General (Geriatric Medicine) [facility]  Extended Emergency Contact Information Primary Emergency Contact: Powell,Katherine  [place] Phone: [phone] Mobile Phone: [phone] Relation: Other Secondary Emergency Contact: Thatcher,Shirley Address: [address]  [place] of [place] Phone: [phone] Mobile Phone: [phone] Relation: Other  Code Status:  DNR Goals of care: Advanced Directive information Advanced Directives 01/16/2017  Does Patient Have a Medical Advance Directive? Yes  Type of Paramedic of [place];Living will  Does patient want to make changes to medical advance directive? -  Copy of [place] in Chart? Yes  Would patient like information on creating a medical advance directive? -  Pre-existing out of facility DNR order (yellow form or pink MOST form) Yellow form placed in chart (order not valid for inpatient use)   Chief Complaint  Patient presents with  . Acute Visit    not doing well    HPI:  Pt is a 83 y.o. female seen today for an acute visit for "doing poorly" per staff.  She was extremely lethargic yesterday, difficult to arouse.  It was discovered that a plant in her room was causing an awful smell that resembled a gas leak.  It was rotten and had to be disposed of asap.  She was feeling a bit better this am and did eat all of her breakfast.  She remains a little more lethargic than normal.  When I saw her, she was sitting up in her chair and spoke a few words and fell back to sleep.  Yesterday, when she was very hypoactively delirious, a UA was taken that was positive (pt not able  to do clean catch) but no growth on culture so far.  Vitals normal.  No cough, dyspnea, diarrhea, nausea or vomiting.  Afebrile.   Past Medical History:  Diagnosis Date  . Arthritis   . Carpal tunnel syndrome on left   . Diverticulosis   . DJD (degenerative joint disease)   . Essential hypertension 06/29/2013   D/c 'd amlodipine  08/27/2014 due to low bp and relative tachycardia    . GERD (gastroesophageal reflux disease)   . Headache(784.0)   . High blood pressure    echo 09-normal  . Lower GI bleed 06/2013   required 2 units PRBC  . Macular degeneration syndrome   . Pleural effusion 08/07/2014  . Wears glasses   . Wears hearing aid    both  . Wears partial dentures    top and bottom   Past Surgical History:  Procedure Laterality Date  . APPENDECTOMY    . CARPAL TUNNEL RELEASE  2/12   rt  . CARPAL TUNNEL RELEASE  04/23/2012   Procedure: CARPAL TUNNEL RELEASE;  Surgeon: Wynonia Sours, MD;  Location: [place];  Service: Orthopedics;  Laterality: Left;  . EYE SURGERY     cataractas  . HIP SURGERY     left  . JOINT REPLACEMENT  2006   rt total hip x2  . KNEE SURGERY    . THYROID CYST EXCISION    . TONSILLECTOMY      Allergies  Allergen Reactions  . Celebrex [Celecoxib] Itching  .  Morphine And Related Nausea Only  . Vicodin [Hydrocodone-Acetaminophen] Itching  . Sulfa Antibiotics Rash    Outpatient Encounter Prescriptions as of 12/12/2016  Medication Sig  . acetaminophen (TYLENOL) 500 MG tablet Take 1,000 mg by mouth 3 (three) times daily.   . Artificial Tear Ointment (ARTIFICIAL TEARS) ointment Place 2 drops into the right eye at bedtime.  . cetirizine (ZYRTEC) 10 MG tablet Take 10 mg by mouth every morning.   . diltiazem (CARDIZEM) 120 MG tablet Take 120 mg by mouth daily.  Marland Kitchen docusate sodium (COLACE) 100 MG capsule Take 100 mg by mouth 2 (two) times daily.  . fluticasone (VERAMYST) 27.5 MCG/SPRAY nasal spray Place 1 spray into the nose daily.  Marland Kitchen  lisinopril (PRINIVIL,ZESTRIL) 20 MG tablet Take 20 mg by mouth daily.  . polyethylene glycol (MIRALAX / GLYCOLAX) packet Take 17 g by mouth every other day.   . ranitidine (ZANTAC) 150 MG capsule Take 150 mg by mouth daily as needed for heartburn (gerd).  . trolamine salicylate (ASPERCREME) 10 % cream Apply 1 application topically 4 (four) times daily as needed for muscle pain.  . vitamin B-12 (CYANOCOBALAMIN) 1000 MCG tablet Take 1,000 mcg by mouth daily.   . [DISCONTINUED] zolpidem (AMBIEN) 5 MG tablet Take 5 mg by mouth at bedtime.   No facility-administered encounter medications on file as of 12/12/2016.     Review of Systems  Unable to perform ROS: Mental status change (delirium on dementia; also severely HOH/deaf; see hpi from nursing)    Immunization History  Administered Date(s) Administered  . Influenza Inj Mdck Quad Pf 06/15/2016  . Influenza Split 05/28/2014  . Influenza-Unspecified 06/10/2015  . Pneumococcal Conjugate-13 03/03/2014  . Pneumococcal Polysaccharide-23 04/05/2016  . Td 10/23/2016   Pertinent  Health Maintenance Due  Topic Date Due  . INFLUENZA VACCINE  03/28/2017  . DEXA SCAN  Completed  . PNA vac Low Risk Adult  Completed   Fall Risk  12/27/2016 08/12/2015 07/15/2015 02/15/2015 01/07/2015  Falls in the past year? No Yes Yes Yes Yes  Number falls in past yr: - 1 1 1 1   Injury with Fall? - No - - Yes  Risk Factor Category  - - - High Fall Risk High Fall Risk  Risk for fall due to : - History of fall(s);Impaired balance/gait History of fall(s) History of fall(s) History of fall(s)  Follow up - Falls evaluation completed Falls evaluation completed - Falls evaluation completed   Functional Status Survey:    Vitals:   12/12/16 1105  BP: (!) 148/95  Pulse: 82  Resp: 18  Temp: 97.8 F (36.6 C)  TempSrc: Oral  SpO2: 96%  Weight: 95 lb (43.1 kg)   Body mass index is 16.83 kg/m. Physical Exam  Constitutional: No distress.  HENT:  Head: Normocephalic  and atraumatic.  Cardiovascular: Normal rate, regular rhythm, normal heart sounds and intact distal pulses.   Pulmonary/Chest: Effort normal and breath sounds normal. No respiratory distress. She has no wheezes.  Abdominal: Soft. Bowel sounds are normal. She exhibits no distension. There is no tenderness.  Musculoskeletal: Normal range of motion.  Neurological:  Lethargic, spoke minimally and cannot hear me at all to answer my questions even when I yell or speak right in her face where she could read my lips (vision also very bad)  Skin: Skin is warm and dry.    Labs reviewed:  Recent Labs  03/07/16 1100 10/03/16 12/11/16  NA 140 140 142  K 4.2 4.3 3.8  BUN  18 17 14   CREATININE 0.6 0.7 0.6    Recent Labs  12/11/16  AST 19  ALT 11  ALKPHOS 79    Recent Labs  03/07/16 1100 12/11/16  WBC 6.3 10.3  HGB 13.5 14.8  HCT 44 46  PLT 222 247   Lab Results  Component Value Date   TSH 4.26 11/17/2014    Assessment/Plan 1. Delirium -await final urine culture, but seems she had some reaction to the toxins from the dying plant in her room -seems to be improving today  2. Late onset Alzheimer's disease with behavioral disturbance -progressing and not helped by loss of two major senses in vision and hearing -cont skilled care  3. Exposure to toxin -from type of plant in her room that released smell like natural gas  4. Sensorineural hearing loss (SNHL) of both ears -severe, very hard to communicate with her b/c of this (and vision)  5. 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 Protein-calorie malnutrition, severe ([place]) -ongoing, cont to allow outside foods she likes and supplements, needing more help with food now due to lethargy and sensory impairments  Family/ staff Communication: discussed with nursing staff  Labs/tests ordered:  Await  final labs from yesterday  Ashrita Chrismer L. Jeramine Delis, D.O. [facility] [address], [place] [address] Cell Phone (Mon-Fri 8am-5pm):  [phone] On Call:  [phone] & follow prompts after 5pm & weekends Office Phone:  [phone] Office Fax:  [phone]

## 2016-12-13 ENCOUNTER — Other Ambulatory Visit: Payer: Self-pay | Admitting: *Deleted

## 2016-12-13 MED ORDER — ZOLPIDEM TARTRATE 5 MG PO TABS: 5.0000 mg | ORAL_TABLET | Freq: Every day | ORAL | 5 refills | Status: DC

## 2016-12-13 NOTE — Telephone Encounter (Signed)
[facility] #[phone] Fax: [phone]  

## 2016-12-15 DIAGNOSIS — R05 Cough: Secondary | ICD-10-CM | POA: Diagnosis not present

## 2016-12-27 ENCOUNTER — Non-Acute Institutional Stay (SKILLED_NURSING_FACILITY): Payer: Medicare Other

## 2016-12-27 DIAGNOSIS — M6259 Muscle wasting and atrophy, not elsewhere classified, multiple sites: Secondary | ICD-10-CM | POA: Diagnosis not present

## 2016-12-27 DIAGNOSIS — L89322 Pressure ulcer of left buttock, stage 2: Secondary | ICD-10-CM | POA: Diagnosis not present

## 2016-12-27 DIAGNOSIS — G308 Other Alzheimer's disease: Secondary | ICD-10-CM | POA: Diagnosis not present

## 2016-12-27 DIAGNOSIS — Z0000 Encounter for general adult medical examination without abnormal findings: Secondary | ICD-10-CM

## 2016-12-27 DIAGNOSIS — R278 Other lack of coordination: Secondary | ICD-10-CM | POA: Diagnosis not present

## 2016-12-27 NOTE — Progress Notes (Signed)
   I reviewed health advisor's note, was available for consultation and agree with the assessment and plan as written.    Remmie Bembenek L. Patterson Hollenbaugh, D.O. [facility] [address], [place] [address] Cell Phone (Mon-Fri 8am-5pm):  [phone] On Call:  [phone] & follow prompts after 5pm & weekends Office Phone:  [phone] Office Fax:  [phone]   Quick Notes   Health Maintenance: None     Abnormal Screen: MMSE 8/28. Pt does not write. Unable to do clock drawing.     Patient Concerns: None     Nurse Concerns: None

## 2016-12-27 NOTE — Progress Notes (Signed)
Subjective:   [name] is a 83 y.o. female who presents for Medicare Annual (Subsequent) preventive examination at [facility].   Cardiac Risk Factors include: advanced age (>18men, >56 women);hypertension;family history of premature cardiovascular disease;sedentary lifestyle;smoking/ tobacco exposure     Objective:     Vitals: BP (!) 145/90 (BP Location: Left Arm, Patient Position: Sitting)   Pulse 96   Temp 99 F (37.2 C) (Oral)   Ht 5\' 3"  (1.6 m)   Wt 95 lb 1.6 oz (43.1 kg)   SpO2 97%   BMI 16.85 kg/m   Body mass index is 16.85 kg/m.   Tobacco History  Smoking Status  . Former Smoker  . Quit date: 07/07/1973  Smokeless Tobacco  . Never Used     Counseling given: Not Answered   Past Medical History:  Diagnosis Date  . Arthritis   . Carpal tunnel syndrome on left   . Diverticulosis   . DJD (degenerative joint disease)   . Essential hypertension 06/29/2013   D/c 'd amlodipine  08/27/2014 due to low bp and relative tachycardia    . GERD (gastroesophageal reflux disease)   . Headache(784.0)   . High blood pressure    echo 09-normal  . Lower GI bleed 06/2013   required 2 units PRBC  . Macular degeneration syndrome   . Pleural effusion 08/07/2014  . Wears glasses   . Wears hearing aid    both  . Wears partial dentures    top and bottom   Past Surgical History:  Procedure Laterality Date  . APPENDECTOMY    . CARPAL TUNNEL RELEASE  2/12   rt  . CARPAL TUNNEL RELEASE  04/23/2012   Procedure: CARPAL TUNNEL RELEASE;  Surgeon: Wynonia Sours, MD;  Location: [place] [facility];  Service: Orthopedics;  Laterality: Left;  . EYE SURGERY     cataractas  . HIP SURGERY     left  . JOINT REPLACEMENT  2006   rt total hip x2  . KNEE SURGERY    . THYROID CYST EXCISION    . TONSILLECTOMY     Family History  Problem Relation Age of Onset  . Heart attack Mother   . Diabetes Father    History  Sexual Activity  . Sexual activity: No     Outpatient Encounter Prescriptions as of 12/27/2016  Medication Sig  . acetaminophen (TYLENOL) 500 MG tablet Take 1,000 mg by mouth 3 (three) times daily.   . Artificial Tear Ointment (ARTIFICIAL TEARS) ointment Place 2 drops into the right eye at bedtime.  . cetirizine (ZYRTEC) 10 MG tablet Take 10 mg by mouth every morning.   . cloNIDine (CATAPRES) 0.1 MG tablet Take 0.1 mg by mouth every 8 (eight) hours.  Marland Kitchen diltiazem (CARDIZEM) 120 MG tablet Take 120 mg by mouth daily.  Marland Kitchen docusate sodium (COLACE) 100 MG capsule Take 100 mg by mouth 2 (two) times daily.  . fluticasone (VERAMYST) 27.5 MCG/SPRAY nasal spray Place 1 spray into the nose daily.  Marland Kitchen lisinopril (PRINIVIL,ZESTRIL) 20 MG tablet Take 20 mg by mouth daily.  . polyethylene glycol (MIRALAX / GLYCOLAX) packet Take 17 g by mouth every other day.   Marland Kitchen QUEtiapine (SEROQUEL) 25 MG tablet Take 25 mg by mouth 2 (two) times daily.  . ranitidine (ZANTAC) 150 MG capsule Take 150 mg by mouth daily as needed for heartburn (gerd).  . trolamine salicylate (ASPERCREME) 10 % cream Apply 1 application topically 4 (four) times daily as  needed for muscle pain.  . vitamin B-12 (CYANOCOBALAMIN) 1000 MCG tablet Take 1,000 mcg by mouth daily.   Marland Kitchen zolpidem (AMBIEN) 5 MG tablet Take 1 tablet (5 mg total) by mouth at bedtime. For rest   No facility-administered encounter medications on file as of 12/27/2016.     Activities of Daily Living In your present state of health, do you have any difficulty performing the following activities: 12/27/2016  Hearing? Y  Vision? Y  Difficulty concentrating or making decisions? Y  Walking or climbing stairs? Y  Dressing or bathing? Y  Doing errands, shopping? Y  Preparing Food and eating ? Y  Using the Toilet? Y  In the past six months, have you accidently leaked urine? Y  Do you have problems with loss of bowel control? Y  Managing your Medications? Y  Managing your Finances? Y  Housekeeping or managing your  Housekeeping? Y  Some recent data might be hidden    Patient Care Team: Gayland Curry, DO as PCP - General (Geriatric Medicine) [facility]    Assessment:     Exercise Activities and Dietary recommendations Current Exercise Habits: The patient does not participate in regular exercise at present, Exercise limited by: orthopedic condition(s)  Goals    . Maintain Lifestyle          Starting 12/27/2016 pt will maintain her lifestyle.      Fall Risk Fall Risk  12/27/2016 08/12/2015 07/15/2015 02/15/2015 01/07/2015  Falls in the past year? No Yes Yes Yes Yes  Number falls in past yr: - 1 1 1 1   Injury with Fall? - No - - Yes  Risk Factor Category  - - - High Fall Risk High Fall Risk  Risk for fall due to : - History of fall(s);Impaired balance/gait History of fall(s) History of fall(s) History of fall(s)  Follow up - Falls evaluation completed Falls evaluation completed - Falls evaluation completed   Depression Screen PHQ 2/9 Scores 12/27/2016 08/12/2015 07/15/2015 02/15/2015  PHQ - 2 Score 0 0 1 -  Exception Documentation - - - Other- indicate reason in comment box  Not completed - - - HOH     Cognitive Function MMSE - Mini Mental State Exam 11/14/2016  Orientation to time 1  Orientation to Place 2  Registration 3  Attention/ Calculation 0  Recall 0  Language- name 2 objects 2  Language- repeat 0  Language- follow 3 step command 0  Language- read & follow direction 0  Write a sentence 0  Copy design 0  Total score 8        Immunization History  Administered Date(s) Administered  . Influenza Inj Mdck Quad Pf 06/15/2016  . Influenza Split 05/28/2014  . Influenza-Unspecified 06/10/2015  . Pneumococcal Conjugate-13 03/03/2014  . Pneumococcal Polysaccharide-23 04/05/2016  . Td 10/23/2016   Screening Tests Health Maintenance  Topic Date Due  . INFLUENZA VACCINE  03/28/2017  . TETANUS/TDAP  10/23/2026  . DEXA SCAN  Completed  . PNA vac Low Risk  Adult  Completed      Plan:    I have personally reviewed and addressed the Medicare Annual Wellness questionnaire and have noted the following in the patient's chart:  A. Medical and social history B. Use of alcohol, tobacco or illicit drugs  C. Current medications and supplements D. Functional ability and status E.  Nutritional status F.  Physical activity G. Advance directives H. List of other physicians I.  Hospitalizations, surgeries, and ER visits in  previous 12 months J.  Vitals K. Screenings to include hearing, vision, cognitive, depression L. Referrals and appointments - none  In addition, I have reviewed and discussed with patient certain preventive protocols, quality metrics, and best practice recommendations. A written personalized care plan for preventive services as well as general preventive health recommendations were provided to patient.  See attached scanned questionnaire for additional information.   Signed,   Rich Reining, RN Nurse Health Advisor

## 2016-12-27 NOTE — Patient Instructions (Signed)
[name] , Thank you for taking time to come for your Medicare Wellness Visit. I appreciate your ongoing commitment to your health goals. Please review the following plan we discussed and let me know if I can assist you in the future.   Screening recommendations/referrals: Colonoscopy up to date Mammogram up to date Bone Density up to date Recommended yearly ophthalmology/optometry visit for glaucoma screening and checkup Recommended yearly dental visit for hygiene and checkup  Vaccinations: Influenza vaccine up to date Pneumococcal vaccine up to date Tdap vaccine up to date. Due 10/23/2016 Shingles vaccine due. If you want this we will put in order.   Advanced directives: In chart  Conditions/risks identified: None  Next appointment: none coming   Preventive Care 39 Years and Older, Female Preventive care refers to lifestyle choices and visits with your health care provider that can promote health and wellness. What does preventive care include?  A yearly physical exam. This is also called an annual well check.  Dental exams once or twice a year.  Routine eye exams. Ask your health care provider how often you should have your eyes checked.  Personal lifestyle choices, including:  Daily care of your teeth and gums.  Regular physical activity.  Eating a healthy diet.  Avoiding tobacco and drug use.  Limiting alcohol use.  Practicing safe sex.  Taking low-dose aspirin every day.  Taking vitamin and mineral supplements as recommended by your health care provider. What happens during an annual well check? The services and screenings done by your health care provider during your annual well check will depend on your age, overall health, lifestyle risk factors, and family history of disease. Counseling  Your health care provider may ask you questions about your:  Alcohol use.  Tobacco use.  Drug use.  Emotional well-being.  Home and relationship  well-being.  Sexual activity.  Eating habits.  History of falls.  Memory and ability to understand (cognition).  Work and work Statistician.  Reproductive health. Screening  You may have the following tests or measurements:  Height, weight, and BMI.  Blood pressure.  Lipid and cholesterol levels. These may be checked every 5 years, or more frequently if you are over 71 years old.  Skin check.  Lung cancer screening. You may have this screening every year starting at age 74 if you have a 30-pack-year history of smoking and currently smoke or have quit within the past 15 years.  Fecal occult blood test (FOBT) of the stool. You may have this test every year starting at age 78.  Flexible sigmoidoscopy or colonoscopy. You may have a sigmoidoscopy every 5 years or a colonoscopy every 10 years starting at age 103.  Hepatitis C blood test.  Hepatitis B blood test.  Sexually transmitted disease (STD) testing.  Diabetes screening. This is done by checking your blood sugar (glucose) after you have not eaten for a while (fasting). You may have this done every 1-3 years.  Bone density scan. This is done to screen for osteoporosis. You may have this done starting at age 76.  Mammogram. This may be done every 1-2 years. Talk to your health care provider about how often you should have regular mammograms. Talk with your health care provider about your test results, treatment options, and if necessary, the need for more tests. Vaccines  Your health care provider may recommend certain vaccines, such as:  Influenza vaccine. This is recommended every year.  Tetanus, diphtheria, and acellular pertussis (Tdap, Td) vaccine. You may  need a Td booster every 10 years.  Zoster vaccine. You may need this after age 70.  Pneumococcal 13-valent conjugate (PCV13) vaccine. One dose is recommended after age 60.  Pneumococcal polysaccharide (PPSV23) vaccine. One dose is recommended after age  56. Talk to your health care provider about which screenings and vaccines you need and how often you need them. This information is not intended to replace advice given to you by your health care provider. Make sure you discuss any questions you have with your health care provider. Document Released: 09/10/2015 Document Revised: 05/03/2016 Document Reviewed: 06/15/2015 Elsevier Interactive Patient Education  2017 [place] Prevention in the Home Falls can cause injuries. They can happen to people of all ages. There are many things you can do to make your home safe and to help prevent falls. What can I do on the outside of my home?  Regularly fix the edges of walkways and driveways and fix any cracks.  Remove anything that might make you trip as you walk through a door, such as a raised step or threshold.  Trim any bushes or trees on the path to your home.  Use bright outdoor lighting.  Clear any walking paths of anything that might make someone trip, such as rocks or tools.  Regularly check to see if handrails are loose or broken. Make sure that both sides of any steps have handrails.  Any raised decks and porches should have guardrails on the edges.  Have any leaves, snow, or ice cleared regularly.  Use sand or salt on walking paths during winter.  Clean up any spills in your garage right away. This includes oil or grease spills. What can I do in the bathroom?  Use night lights.  Install grab bars by the toilet and in the tub and shower. Do not use towel bars as grab bars.  Use non-skid mats or decals in the tub or shower.  If you need to sit down in the shower, use a plastic, non-slip stool.  Keep the floor dry. Clean up any water that spills on the floor as soon as it happens.  Remove soap buildup in the tub or shower regularly.  Attach bath mats securely with double-sided non-slip rug tape.  Do not have throw rugs and other things on the floor that can make  you trip. What can I do in the bedroom?  Use night lights.  Make sure that you have a light by your bed that is easy to reach.  Do not use any sheets or blankets that are too big for your bed. They should not hang down onto the floor.  Have a firm chair that has side arms. You can use this for support while you get dressed.  Do not have throw rugs and other things on the floor that can make you trip. What can I do in the kitchen?  Clean up any spills right away.  Avoid walking on wet floors.  Keep items that you use a lot in easy-to-reach places.  If you need to reach something above you, use a strong step stool that has a grab bar.  Keep electrical cords out of the way.  Do not use floor polish or wax that makes floors slippery. If you must use wax, use non-skid floor wax.  Do not have throw rugs and other things on the floor that can make you trip. What can I do with my stairs?  Do not leave any items on  the stairs.  Make sure that there are handrails on both sides of the stairs and use them. Fix handrails that are broken or loose. Make sure that handrails are as long as the stairways.  Check any carpeting to make sure that it is firmly attached to the stairs. Fix any carpet that is loose or worn.  Avoid having throw rugs at the top or bottom of the stairs. If you do have throw rugs, attach them to the floor with carpet tape.  Make sure that you have a light switch at the top of the stairs and the bottom of the stairs. If you do not have them, ask someone to add them for you. What else can I do to help prevent falls?  Wear shoes that:  Do not have high heels.  Have rubber bottoms.  Are comfortable and fit you well.  Are closed at the toe. Do not wear sandals.  If you use a stepladder:  Make sure that it is fully opened. Do not climb a closed stepladder.  Make sure that both sides of the stepladder are locked into place.  Ask someone to hold it for you, if  possible.  Clearly mark and make sure that you can see:  Any grab bars or handrails.  First and last steps.  Where the edge of each step is.  Use tools that help you move around (mobility aids) if they are needed. These include:  Canes.  Walkers.  Scooters.  Crutches.  Turn on the lights when you go into a dark area. Replace any light bulbs as soon as they burn out.  Set up your furniture so you have a clear path. Avoid moving your furniture around.  If any of your floors are uneven, fix them.  If there are any pets around you, be aware of where they are.  Review your medicines with your doctor. Some medicines can make you feel dizzy. This can increase your chance of falling. Ask your doctor what other things that you can do to help prevent falls. This information is not intended to replace advice given to you by your health care provider. Make sure you discuss any questions you have with your health care provider. Document Released: 06/10/2009 Document Revised: 01/20/2016 Document Reviewed: 09/18/2014 Elsevier Interactive Patient Education  2017 [employer].

## 2016-12-28 DIAGNOSIS — L89322 Pressure ulcer of left buttock, stage 2: Secondary | ICD-10-CM | POA: Diagnosis not present

## 2016-12-28 DIAGNOSIS — R278 Other lack of coordination: Secondary | ICD-10-CM | POA: Diagnosis not present

## 2016-12-28 DIAGNOSIS — G308 Other Alzheimer's disease: Secondary | ICD-10-CM | POA: Diagnosis not present

## 2016-12-28 DIAGNOSIS — M6259 Muscle wasting and atrophy, not elsewhere classified, multiple sites: Secondary | ICD-10-CM | POA: Diagnosis not present

## 2016-12-29 DIAGNOSIS — M6259 Muscle wasting and atrophy, not elsewhere classified, multiple sites: Secondary | ICD-10-CM | POA: Diagnosis not present

## 2016-12-29 DIAGNOSIS — L89322 Pressure ulcer of left buttock, stage 2: Secondary | ICD-10-CM | POA: Diagnosis not present

## 2016-12-29 DIAGNOSIS — G308 Other Alzheimer's disease: Secondary | ICD-10-CM | POA: Diagnosis not present

## 2016-12-29 DIAGNOSIS — R278 Other lack of coordination: Secondary | ICD-10-CM | POA: Diagnosis not present

## 2016-12-31 DIAGNOSIS — L89322 Pressure ulcer of left buttock, stage 2: Secondary | ICD-10-CM | POA: Diagnosis not present

## 2016-12-31 DIAGNOSIS — M6259 Muscle wasting and atrophy, not elsewhere classified, multiple sites: Secondary | ICD-10-CM | POA: Diagnosis not present

## 2016-12-31 DIAGNOSIS — G308 Other Alzheimer's disease: Secondary | ICD-10-CM | POA: Diagnosis not present

## 2016-12-31 DIAGNOSIS — R278 Other lack of coordination: Secondary | ICD-10-CM | POA: Diagnosis not present

## 2017-01-01 DIAGNOSIS — G308 Other Alzheimer's disease: Secondary | ICD-10-CM | POA: Diagnosis not present

## 2017-01-01 DIAGNOSIS — R278 Other lack of coordination: Secondary | ICD-10-CM | POA: Diagnosis not present

## 2017-01-01 DIAGNOSIS — L89322 Pressure ulcer of left buttock, stage 2: Secondary | ICD-10-CM | POA: Diagnosis not present

## 2017-01-01 DIAGNOSIS — M6259 Muscle wasting and atrophy, not elsewhere classified, multiple sites: Secondary | ICD-10-CM | POA: Diagnosis not present

## 2017-01-02 DIAGNOSIS — M6259 Muscle wasting and atrophy, not elsewhere classified, multiple sites: Secondary | ICD-10-CM | POA: Diagnosis not present

## 2017-01-02 DIAGNOSIS — G308 Other Alzheimer's disease: Secondary | ICD-10-CM | POA: Diagnosis not present

## 2017-01-02 DIAGNOSIS — L89322 Pressure ulcer of left buttock, stage 2: Secondary | ICD-10-CM | POA: Diagnosis not present

## 2017-01-02 DIAGNOSIS — R278 Other lack of coordination: Secondary | ICD-10-CM | POA: Diagnosis not present

## 2017-01-03 DIAGNOSIS — G308 Other Alzheimer's disease: Secondary | ICD-10-CM | POA: Diagnosis not present

## 2017-01-03 DIAGNOSIS — L89322 Pressure ulcer of left buttock, stage 2: Secondary | ICD-10-CM | POA: Diagnosis not present

## 2017-01-03 DIAGNOSIS — R278 Other lack of coordination: Secondary | ICD-10-CM | POA: Diagnosis not present

## 2017-01-03 DIAGNOSIS — M6259 Muscle wasting and atrophy, not elsewhere classified, multiple sites: Secondary | ICD-10-CM | POA: Diagnosis not present

## 2017-01-09 DIAGNOSIS — G308 Other Alzheimer's disease: Secondary | ICD-10-CM | POA: Diagnosis not present

## 2017-01-09 DIAGNOSIS — L89322 Pressure ulcer of left buttock, stage 2: Secondary | ICD-10-CM | POA: Diagnosis not present

## 2017-01-09 DIAGNOSIS — R278 Other lack of coordination: Secondary | ICD-10-CM | POA: Diagnosis not present

## 2017-01-09 DIAGNOSIS — M6259 Muscle wasting and atrophy, not elsewhere classified, multiple sites: Secondary | ICD-10-CM | POA: Diagnosis not present

## 2017-01-10 DIAGNOSIS — L89322 Pressure ulcer of left buttock, stage 2: Secondary | ICD-10-CM | POA: Diagnosis not present

## 2017-01-10 DIAGNOSIS — G308 Other Alzheimer's disease: Secondary | ICD-10-CM | POA: Diagnosis not present

## 2017-01-10 DIAGNOSIS — R278 Other lack of coordination: Secondary | ICD-10-CM | POA: Diagnosis not present

## 2017-01-10 DIAGNOSIS — M6259 Muscle wasting and atrophy, not elsewhere classified, multiple sites: Secondary | ICD-10-CM | POA: Diagnosis not present

## 2017-01-11 ENCOUNTER — Non-Acute Institutional Stay (SKILLED_NURSING_FACILITY): Payer: Medicare Other | Admitting: Adult Health

## 2017-01-11 DIAGNOSIS — G301 Alzheimer's disease with late onset: Secondary | ICD-10-CM | POA: Diagnosis not present

## 2017-01-11 DIAGNOSIS — G308 Other Alzheimer's disease: Secondary | ICD-10-CM | POA: Diagnosis not present

## 2017-01-11 DIAGNOSIS — F0281 Dementia in other diseases classified elsewhere with behavioral disturbance: Secondary | ICD-10-CM

## 2017-01-11 DIAGNOSIS — L989 Disorder of the skin and subcutaneous tissue, unspecified: Secondary | ICD-10-CM

## 2017-01-11 DIAGNOSIS — H903 Sensorineural hearing loss, bilateral: Secondary | ICD-10-CM | POA: Diagnosis not present

## 2017-01-11 DIAGNOSIS — R278 Other lack of coordination: Secondary | ICD-10-CM | POA: Diagnosis not present

## 2017-01-11 DIAGNOSIS — L89322 Pressure ulcer of left buttock, stage 2: Secondary | ICD-10-CM | POA: Diagnosis not present

## 2017-01-11 DIAGNOSIS — R634 Abnormal weight loss: Secondary | ICD-10-CM | POA: Diagnosis not present

## 2017-01-11 DIAGNOSIS — M6259 Muscle wasting and atrophy, not elsewhere classified, multiple sites: Secondary | ICD-10-CM | POA: Diagnosis not present

## 2017-01-11 DIAGNOSIS — F02818 Dementia in other diseases classified elsewhere, unspecified severity, with other behavioral disturbance: Secondary | ICD-10-CM

## 2017-01-11 NOTE — Progress Notes (Signed)
Patient ID: [name], female   DOB: July 19, 1917, 83 y.o.   MRN: [id number]   Location:   [place]  S[facility] (31) Provider:   Cindi Carbon, ANP [facility] [phone]   Gayland Curry, DO  Patient Care Team: Gayland Curry, DO as PCP - General (Geriatric Medicine) Fay Records, Well Cristela Felt  Extended Emergency Contact Information Primary Emergency Contact: Powell,Katherine  [place] Phone: [phone] Mobile Phone: [phone] Relation: Other Secondary Emergency Contact: Thatcher,Shirley Address: [address]  [place] Phone: [phone] Mobile Phone: [phone] Relation: Other  Code Status:  DNR Goals of care: Advanced Directive information Advanced Directives 01/16/2017  Does Patient Have a Medical Advance Directive? Yes  Type of Paramedic of [place];Living will  Does patient want to make changes to medical advance directive? -  Copy of [place] in Chart? Yes  Would patient like information on creating a medical advance directive? -  Pre-existing out of facility DNR order (yellow form or pink MOST form) Yellow form placed in chart (order not valid for inpatient use)     Chief Complaint  Patient presents with  . Medical Management of Chronic Issues    HPI:  Pt is a 83 y.o. female seen today for medical management of chronic diseases. She resides in skilled care at [facility] due to age, debility, and  memory loss. She has a private caregiver, who is also her POA,  with her each morning. Due to increased weakness, she now uses a stand up lift to for transfers. She has advanced Alz dementia and is not taking any memory medication due to age, debility, and lack of benefit  Ms. Aerts has become increasingly hard of hearing. She also had agitation in April which was new, but labs, urines, and xrays were  unrevealing. It was felt to be due to advancing dementia. She has since stabilized with no acute change in cognition or behaviors.  She has refused the prescribed ensure that she used to consume and therefore dropped to 88 lbs. She believed that she weighed 121 lbs, despite repeatedly being told otherwise.  Past Medical History:  Diagnosis Date  . Arthritis   . Carpal tunnel syndrome on left   . Diverticulosis   . DJD (degenerative joint disease)   . Essential hypertension 06/29/2013   D/c 'd amlodipine  08/27/2014 due to low bp and relative tachycardia    . GERD (gastroesophageal reflux disease)   . Headache(784.0)   . High blood pressure    echo 09-normal  . Lower GI bleed 06/2013   required 2 units PRBC  . Macular degeneration syndrome   . Pleural effusion 08/07/2014  . Wears glasses   . Wears hearing aid    both  . Wears partial dentures    top and bottom   Past Surgical History:  Procedure Laterality Date  . APPENDECTOMY    . CARPAL TUNNEL RELEASE  2/12   rt  . CARPAL TUNNEL RELEASE  04/23/2012   Procedure: CARPAL TUNNEL RELEASE;  Surgeon: Wynonia Sours, MD;  Location: [facility];  Service: Orthopedics;  Laterality: Left;  . EYE SURGERY     cataractas  . HIP SURGERY     left  . JOINT REPLACEMENT  2006   rt total hip x2  . KNEE SURGERY    . THYROID CYST EXCISION    . TONSILLECTOMY  Allergies  Allergen Reactions  . Celebrex [Celecoxib] Itching  . Morphine And Related Nausea Only  . Vicodin [Hydrocodone-Acetaminophen] Itching  . Sulfa Antibiotics Rash    Allergies as of 01/11/2017      Reactions   Celebrex [celecoxib] Itching   Morphine And Related Nausea Only   Vicodin [hydrocodone-acetaminophen] Itching   Sulfa Antibiotics Rash      Medication List       Accurate as of 01/11/17 11:59 PM. Always use your most recent med list.          acetaminophen 500 MG tablet Commonly known as:  TYLENOL Take 1,000 mg by mouth 3 (three) times  daily.   artificial tears ointment Place 2 drops into the right eye at bedtime.   cetirizine 10 MG tablet Commonly known as:  ZYRTEC Take 10 mg by mouth every morning.   cloNIDine 0.1 MG tablet Commonly known as:  CATAPRES Take 0.1 mg by mouth 3 (three) times daily as needed.   diltiazem 120 MG tablet Commonly known as:  CARDIZEM Take 120 mg by mouth daily.   docusate sodium 100 MG capsule Commonly known as:  COLACE Take 100 mg by mouth 2 (two) times daily.   fluticasone 27.5 MCG/SPRAY nasal spray Commonly known as:  VERAMYST Place 1 spray into the nose daily.   lisinopril 20 MG tablet Commonly known as:  PRINIVIL,ZESTRIL Take 20 mg by mouth daily.   polyethylene glycol packet Commonly known as:  MIRALAX / GLYCOLAX Take 17 g by mouth every other day.   ranitidine 150 MG capsule Commonly known as:  ZANTAC Take 150 mg by mouth daily as needed for heartburn (gerd).   trolamine salicylate 10 % cream Commonly known as:  ASPERCREME Apply 1 application topically 4 (four) times daily as needed for muscle pain.   vitamin B-12 1000 MCG tablet Commonly known as:  CYANOCOBALAMIN Take 1,000 mcg by mouth daily.   zolpidem 5 MG tablet Commonly known as:  AMBIEN Take 1 tablet (5 mg total) by mouth at bedtime. For rest       Review of Systems  Constitutional: Negative for chills, diaphoresis, fever, malaise/fatigue and pos for weight loss Respiratory: Negative for cough, hemoptysis, sputum production and wheezing.   Cardiovascular: Positive for leg swelling (pedal). Negative for chest pain and PND.  Gastrointestinal: Negative for abdominal pain, constipation, diarrhea, nausea and vomiting.  Genitourinary: Negative for dysuria and urgency.  Musculoskeletal: Positive for joint pain. Negative for back pain, falls, myalgias and neck pain.  Skin: Negative for itching and rash.  Neurological: Negative for dizziness, tremors, focal weakness and weakness.  Psychiatric/Behavioral:  Positive for memory loss. Negative for depression and hallucinations. The patient is not nervous/anxious and does not have insomnia.      Immunization History  Administered Date(s) Administered  . Influenza Inj Mdck Quad Pf 06/15/2016  . Influenza Split 05/28/2014  . Influenza-Unspecified 06/10/2015  . Pneumococcal Conjugate-13 03/03/2014  . Pneumococcal Polysaccharide-23 04/05/2016  . Td 10/23/2016   Pertinent  Health Maintenance Due  Topic Date Due  . INFLUENZA VACCINE  03/28/2017  . DEXA SCAN  Completed  . PNA vac Low Risk Adult  Completed   Fall Risk  12/27/2016 08/12/2015 07/15/2015 02/15/2015 01/07/2015  Falls in the past year? No Yes Yes Yes Yes  Number falls in past yr: - 1 1 1 1   Injury with Fall? - No - - Yes  Risk Factor Category  - - - High Fall Risk High Fall Risk  Risk for  fall due to : - History of fall(s);Impaired balance/gait History of fall(s) History of fall(s) History of fall(s)  Follow up - Falls evaluation completed Falls evaluation completed - Falls evaluation completed   Functional Status Survey:   Wt Readings from Last 3 Encounters:  01/16/17 88 lb (39.9 kg)  12/27/16 95 lb 1.6 oz (43.1 kg)  12/12/16 95 lb (43.1 kg)   Vitals:   01/16/17 1528  BP: (!) 152/84  Pulse: 74  Resp: (!) 22  Temp: 98.6 F (37 C)  Weight: 88 lb (39.9 kg)   Body mass index is 15.59 kg/m.  Physical Exam  Constitutional: No distress.  Frail, thin  HENT:  Head: Normocephalic and atraumatic.  HOH  Neck: No JVD present.  Cardiovascular: Normal rate and regular rhythm.   No murmur heard. bilat pedal edema  Pulmonary/Chest: Effort normal and breath sounds normal. No respiratory distress. She has no wheezes.  Abdominal: Soft. Bowel sounds are normal. She exhibits no distension.  Neurological: She is alert.  Oriented to self, place, situation, not time.   Skin: Skin is warm and dry. She is not diaphoretic. right wrist with raised lesion with surrounding  erythema. Psychiatric: She has a normal mood and affect.    Labs reviewed:  Recent Labs  03/07/16 1100 10/03/16 12/11/16  NA 140 140 142  K 4.2 4.3 3.8  BUN 18 17 14   CREATININE 0.6 0.7 0.6    Recent Labs  12/11/16  AST 19  ALT 11  ALKPHOS 79    Recent Labs  03/07/16 1100 12/11/16  WBC 6.3 10.3  HGB 13.5 14.8  HCT 44 46  PLT 222 247   Lab Results  Component Value Date   TSH 4.26 11/17/2014   No results found for: HGBA1C No results found for: CHOL, HDL, LDLCALC, LDLDIRECT, TRIG, CHOLHDL  Significant Diagnostic Results in last 30 days:  No results found.  Assessment/Plan  1. Late onset Alzheimer's disease with behavioral disturbance Progressive decline in cognition and physical function Has had periods of anxiety/agitation which have resolved for now so there is no current need for meds to control this issue  2. Sensorineural hearing loss (SNHL) of both ears Noted on exam, was able to hear better with the assistance of my stethoscope.  This certainly exacerbates her dementia.  3. Loss of weight After counseling/education during our visit she agreed to start consuming ensure TID as previously ordered. Weights weekly x 2 months  4. Skin lesion Derm referral right wrist lesion, ?cutaneous horn  Family/ staff Communication: discussed with staff/resident  Labs/tests ordered: NA  Cindi Carbon, ANP [facility] [phone]

## 2017-01-15 DIAGNOSIS — L89322 Pressure ulcer of left buttock, stage 2: Secondary | ICD-10-CM | POA: Diagnosis not present

## 2017-01-15 DIAGNOSIS — R278 Other lack of coordination: Secondary | ICD-10-CM | POA: Diagnosis not present

## 2017-01-15 DIAGNOSIS — G308 Other Alzheimer's disease: Secondary | ICD-10-CM | POA: Diagnosis not present

## 2017-01-15 DIAGNOSIS — M6259 Muscle wasting and atrophy, not elsewhere classified, multiple sites: Secondary | ICD-10-CM | POA: Diagnosis not present

## 2017-01-16 ENCOUNTER — Encounter: Payer: Self-pay | Admitting: Adult Health

## 2017-01-16 DIAGNOSIS — M6259 Muscle wasting and atrophy, not elsewhere classified, multiple sites: Secondary | ICD-10-CM | POA: Diagnosis not present

## 2017-01-16 DIAGNOSIS — G308 Other Alzheimer's disease: Secondary | ICD-10-CM | POA: Diagnosis not present

## 2017-01-16 DIAGNOSIS — R278 Other lack of coordination: Secondary | ICD-10-CM | POA: Diagnosis not present

## 2017-01-16 DIAGNOSIS — L89322 Pressure ulcer of left buttock, stage 2: Secondary | ICD-10-CM | POA: Diagnosis not present

## 2017-02-13 DIAGNOSIS — L821 Other seborrheic keratosis: Secondary | ICD-10-CM | POA: Diagnosis not present

## 2017-02-13 DIAGNOSIS — L570 Actinic keratosis: Secondary | ICD-10-CM | POA: Diagnosis not present

## 2017-02-13 DIAGNOSIS — L814 Other melanin hyperpigmentation: Secondary | ICD-10-CM | POA: Diagnosis not present

## 2017-02-19 ENCOUNTER — Non-Acute Institutional Stay (SKILLED_NURSING_FACILITY): Payer: Medicare Other | Admitting: Adult Health

## 2017-02-19 DIAGNOSIS — F0281 Dementia in other diseases classified elsewhere with behavioral disturbance: Secondary | ICD-10-CM

## 2017-02-19 DIAGNOSIS — E43 Unspecified severe protein-calorie malnutrition: Secondary | ICD-10-CM | POA: Diagnosis not present

## 2017-02-19 DIAGNOSIS — G301 Alzheimer's disease with late onset: Secondary | ICD-10-CM | POA: Diagnosis not present

## 2017-02-19 DIAGNOSIS — M19011 Primary osteoarthritis, right shoulder: Secondary | ICD-10-CM

## 2017-02-19 DIAGNOSIS — F02818 Dementia in other diseases classified elsewhere, unspecified severity, with other behavioral disturbance: Secondary | ICD-10-CM

## 2017-02-19 DIAGNOSIS — F5101 Primary insomnia: Secondary | ICD-10-CM | POA: Diagnosis not present

## 2017-02-19 DIAGNOSIS — K5901 Slow transit constipation: Secondary | ICD-10-CM

## 2017-02-19 DIAGNOSIS — R140 Abdominal distension (gaseous): Secondary | ICD-10-CM

## 2017-02-27 ENCOUNTER — Encounter: Payer: Self-pay | Admitting: Adult Health

## 2017-02-27 NOTE — Progress Notes (Signed)
Patient ID: [name], female   DOB: 1917-01-20, 83 y.o.   MRN: [id number]   Location:   [place]:  SNF (31) Provider:   Cindi Carbon, ANP [place] [phone]   Gayland Curry, DO  Patient Care Team: Gayland Curry, DO as PCP - General (Geriatric Medicine) [facility], Well Cristela Felt  Extended Emergency Contact Information Primary Emergency Contact: Powell,Katherine  [place] Phone: [phone] Mobile Phone: [phone] Relation: Other Secondary Emergency Contact: Thatcher,Shirley Address: [address]  [place] of [place] Phone: [phone] Mobile Phone: [phone] Relation: Other  Code Status:  DNR Goals of care: Advanced Directive information Advanced Directives 02/27/2017  Does Patient Have a Medical Advance Directive? Yes  Type of Paramedic of [place];Living will  Does patient want to make changes to medical advance directive? -  Copy of [place] in Chart? Yes  Would patient like information on creating a medical advance directive? -  Pre-existing out of facility DNR order (yellow form or pink MOST form) Yellow form placed in chart (order not valid for inpatient use)     Chief Complaint  Patient presents with  . Medical Management of Chronic Issues    HPI:  Pt is a 83 y.o. female seen today for medical management of chronic diseases. She resides in skilled care at [facility] due to age, debility, and  memory loss. She has a private caregiver, who is also her POA,  with her each morning. Due to increased weakness, she now uses a stand up lift to for transfers. She has advanced Alz dementia and is not taking any memory medication due to age, debility, and lack of benefit  1. Protein-calorie malnutrition, severe ([place]) Refuses ensure Has a fixed belief that she is gaining weight and does not need it Wt  Readings from Last 3 Encounters:  02/27/17 88 lb 14.4 oz (40.3 kg)  01/16/17 88 lb (39.9 kg)  12/27/16 95 lb 1.6 oz (43.1 kg)  Did not respond to remeron in the past  2. Bloating Reports excessive gas frequently and chews gum frequently throughout the day  3. Primary osteoarthritis of right shoulder Reports right shoulder pain which is a chronic problem due to OA, rotator cuff pathology and frozen shoulder  4. Late onset Alzheimer's disease with behavioral disturbance MMSE 8/24 Jan 2017, some deficits due to hearing and vision loss  5. Slow transit constipation Has regular BMS with miralax and Colace  6. Primary insomnia Reports that she sleeps well with Ambien    Past Medical History:  Diagnosis Date  . Arthritis   . Carpal tunnel syndrome on left   . Diverticulosis   . DJD (degenerative joint disease)   . Essential hypertension 06/29/2013   D/c 'd amlodipine  08/27/2014 due to low bp and relative tachycardia    . GERD (gastroesophageal reflux disease)   . Headache(784.0)   . High blood pressure    echo 09-normal  . Lower GI bleed 06/2013   required 2 units PRBC  . Macular degeneration syndrome   . Pleural effusion 08/07/2014  . Wears glasses   . Wears hearing aid    both  . Wears partial dentures    top and bottom   Past Surgical History:  Procedure Laterality Date  . APPENDECTOMY    . CARPAL TUNNEL RELEASE  2/12   rt  . CARPAL TUNNEL RELEASE  04/23/2012  Procedure: CARPAL TUNNEL RELEASE;  Surgeon: Wynonia Sours, MD;  Location: [facility];  Service: Orthopedics;  Laterality: Left;  . EYE SURGERY     cataractas  . HIP SURGERY     left  . JOINT REPLACEMENT  2006   rt total hip x2  . KNEE SURGERY    . THYROID CYST EXCISION    . TONSILLECTOMY      Allergies  Allergen Reactions  . Celebrex [Celecoxib] Itching  . Morphine And Related Nausea Only  . Vicodin [Hydrocodone-Acetaminophen] Itching  . Sulfa Antibiotics Rash    Allergies as of  02/19/2017      Reactions   Celebrex [celecoxib] Itching   Morphine And Related Nausea Only   Vicodin [hydrocodone-acetaminophen] Itching   Sulfa Antibiotics Rash      Medication List       Accurate as of 02/19/17 11:59 PM. Always use your most recent med list.          acetaminophen 500 MG tablet Commonly known as:  TYLENOL Take 1,000 mg by mouth 3 (three) times daily.   artificial tears ointment Place 2 drops into the right eye at bedtime.   cetirizine 10 MG tablet Commonly known as:  ZYRTEC Take 10 mg by mouth every morning.   cloNIDine 0.1 MG tablet Commonly known as:  CATAPRES Take 0.1 mg by mouth 3 (three) times daily as needed.   diltiazem 120 MG tablet Commonly known as:  CARDIZEM Take 120 mg by mouth daily.   docusate sodium 100 MG capsule Commonly known as:  COLACE Take 100 mg by mouth 2 (two) times daily.   fluticasone 27.5 MCG/SPRAY nasal spray Commonly known as:  VERAMYST Place 1 spray into the nose daily.   lisinopril 20 MG tablet Commonly known as:  PRINIVIL,ZESTRIL Take 20 mg by mouth daily.   polyethylene glycol packet Commonly known as:  MIRALAX / GLYCOLAX Take 17 g by mouth every other day.   ranitidine 150 MG capsule Commonly known as:  ZANTAC Take 150 mg by mouth daily as needed for heartburn (gerd).   trolamine salicylate 10 % cream Commonly known as:  ASPERCREME Apply 1 application topically 4 (four) times daily as needed for muscle pain.   vitamin B-12 1000 MCG tablet Commonly known as:  CYANOCOBALAMIN Take 1,000 mcg by mouth daily.   zolpidem 5 MG tablet Commonly known as:  AMBIEN Take 1 tablet (5 mg total) by mouth at bedtime. For rest       Review of Systems  Constitutional: Negative for chills, diaphoresis, fever, malaise/fatigue and pos for weight loss Respiratory: Negative for cough, hemoptysis, sputum production and wheezing.   Cardiovascular: Positive for leg swelling (pedal). Negative for chest pain and PND.    Gastrointestinal: Negative for abdominal pain, constipation, diarrhea, nausea and vomiting.  Genitourinary: Negative for dysuria and urgency.  Musculoskeletal: Positive for joint pain. Negative for back pain, falls, myalgias and neck pain.  Skin: Negative for itching and rash.  Neurological: Negative for dizziness, tremors, focal weakness and weakness.  Psychiatric/Behavioral: Positive for memory loss. Negative for depression and hallucinations. The patient is not nervous/anxious and does not have insomnia.      Immunization History  Administered Date(s) Administered  . Influenza Inj Mdck Quad Pf 06/15/2016  . Influenza Split 05/28/2014  . Influenza-Unspecified 06/10/2015  . Pneumococcal Conjugate-13 03/03/2014  . Pneumococcal Polysaccharide-23 04/05/2016  . Td 10/23/2016   Pertinent  Health Maintenance Due  Topic Date Due  . INFLUENZA VACCINE  03/28/2017  . DEXA SCAN  Completed  . PNA vac Low Risk Adult  Completed   Fall Risk  12/27/2016 08/12/2015 07/15/2015 02/15/2015 01/07/2015  Falls in the past year? No Yes Yes Yes Yes  Number falls in past yr: - 1 1 1 1   Injury with Fall? - No - - Yes  Risk Factor Category  - - - High Fall Risk High Fall Risk  Risk for fall due to : - History of fall(s);Impaired balance/gait History of fall(s) History of fall(s) History of fall(s)  Follow up - Falls evaluation completed Falls evaluation completed - Falls evaluation completed   Functional Status Survey:   Wt Readings from Last 3 Encounters:  02/27/17 88 lb 14.4 oz (40.3 kg)  01/16/17 88 lb (39.9 kg)  12/27/16 95 lb 1.6 oz (43.1 kg)   Vitals:   02/27/17 0728  BP: 128/85  Pulse: 68  Resp: 18  Temp: 97.1 F (36.2 C)  Weight: 88 lb 14.4 oz (40.3 kg)   Body mass index is 15.75 kg/m.  Physical Exam  Constitutional: No distress.  Frail and thin  HENT:  Head: Normocephalic and atraumatic.  Neck: No JVD present.  Cardiovascular: Normal rate and regular rhythm.   No murmur  heard. Pulmonary/Chest: Effort normal and breath sounds normal. No respiratory distress. She has no wheezes.  Abdominal: Soft. Bowel sounds are normal. She exhibits no distension. There is no tenderness.  Musculoskeletal: She exhibits no edema.  Crepitus and pain with PROM to the right shoulder. Decreased AROM to right shoulder. No warmth, swelling, or tenderness  Neurological: She is alert.  Oriented to self and location. Not situation or time. Able to f/c.   Skin: Skin is warm and dry. She is not diaphoretic.  Psychiatric: She has a normal mood and affect.     Labs reviewed:  Recent Labs  03/07/16 1100 10/03/16 12/11/16  NA 140 140 142  K 4.2 4.3 3.8  BUN 18 17 14   CREATININE 0.6 0.7 0.6    Recent Labs  12/11/16  AST 19  ALT 11  ALKPHOS 79    Recent Labs  03/07/16 1100 12/11/16  WBC 6.3 10.3  HGB 13.5 14.8  HCT 44 46  PLT 222 247   Lab Results  Component Value Date   TSH 4.26 11/17/2014   No results found for: HGBA1C No results found for: CHOL, HDL, LDLCALC, LDLDIRECT, TRIG, CHOLHDL  Significant Diagnostic Results in last 30 days:  No results found.  Assessment/Plan  1. Protein-calorie malnutrition, severe ([place]) She is refusing nutritional supplements so I changed the ensure to TID prn We will continue to monitor her weight and nutritional status. I have asked the staff to provide her POA with a MOST form I do not see any benefit at this point to adding an appetite stimulant  2. Bloating Continues to chew gum regularly despite advisement that this may be causing the problem  Resume simethicone 80 mg two tabs BID  3. Primary osteoarthritis of right shoulder Has frozen shoulder as well  Continue scheduled Tylenol and prn aspercreme  4. Late onset Alzheimer's disease with behavioral disturbance Progressive cognitive and functional losses consistent with AD Would not benefit from medication  5. Slow transit constipation Controlled Continue Miralax  17 grams QOD and Colace 100 mg BID  6. Primary insomnia Sleeps well, has not tolerated trial off in the past Continue Ambien 5 mg qhs   Family/ staff Communication: discussed with staff/resident  Labs/tests ordered: NA  [employer]  Melvyn Novas, ANP [facility] [phone]

## 2017-03-13 ENCOUNTER — Non-Acute Institutional Stay (SKILLED_NURSING_FACILITY): Payer: Medicare Other | Admitting: Internal Medicine

## 2017-03-13 ENCOUNTER — Encounter: Payer: Self-pay | Admitting: Internal Medicine

## 2017-03-13 DIAGNOSIS — F0281 Dementia in other diseases classified elsewhere with behavioral disturbance: Secondary | ICD-10-CM

## 2017-03-13 DIAGNOSIS — H353230 Exudative age-related macular degeneration, bilateral, stage unspecified: Secondary | ICD-10-CM

## 2017-03-13 DIAGNOSIS — E43 Unspecified severe protein-calorie malnutrition: Secondary | ICD-10-CM

## 2017-03-13 DIAGNOSIS — H903 Sensorineural hearing loss, bilateral: Secondary | ICD-10-CM

## 2017-03-13 DIAGNOSIS — R634 Abnormal weight loss: Secondary | ICD-10-CM

## 2017-03-13 DIAGNOSIS — G301 Alzheimer's disease with late onset: Secondary | ICD-10-CM | POA: Diagnosis not present

## 2017-03-13 DIAGNOSIS — F02818 Dementia in other diseases classified elsewhere, unspecified severity, with other behavioral disturbance: Secondary | ICD-10-CM

## 2017-03-13 NOTE — Progress Notes (Signed)
Patient ID: [name], female   DOB: 05-05-1917, 83 y.o.   MRN: [id number]  Location:  [facility] Room Number: 136 Place of Service:  SNF ([phone]) Provider:   Gayland Curry, DO  Patient Care Team: Gayland Curry, DO as PCP - General (Geriatric Medicine) Community, Well Cristela Felt  Extended Emergency Contact Information Primary Emergency Contact: Powell,Katherine  [place] Phone: [phone] Mobile Phone: [phone] Relation: Other Secondary Emergency Contact: Thatcher,Shirley Address: [address]  [place]te L[place] of [place] Phone: [phone] Mobile Phone: [phone] Relation: Other  Code Status:  DNR Goals of care: Advanced Directive information Advanced Directives 03/13/2017  Does Patient Have a Medical Advance Directive? Yes  Type of Advance Directive Out of facility DNR (pink MOST or yellow form);Healthcare Power of Attorney  Does patient want to make changes to medical advance directive? -  Copy of [place] in Chart? Yes  Would patient like information on creating a medical advance directive? -  Pre-existing out of facility DNR order (yellow form or pink MOST form) Yellow form placed in chart (order not valid for inpatient use);Pink MOST form placed in chart (order not valid for inpatient use)     Chief Complaint  Patient presents with  . Medical Management of Chronic Issues    Routine Visit    HPI:  Pt is a 83 y.o. female seen today for medical management of chronic diseases.    She has gotten weaker recently and now requires the standup lift for transfers.  She requires assistance with all ADLs in snf.    Weight loss progressing as she refuses ensure and has a delusion that she's gaining weight.  Likes outside food like [employer].    MOST form was given to family to review and make some decisions about her goals.  It was completed to indicate DNR, comfort  measures, IVF and abx only for a defined trial period.    She remains very HOH and has very poor vision.  Dementia is moderate.     Past Medical History:  Diagnosis Date  . Arthritis   . Carpal tunnel syndrome on left   . Diverticulosis   . DJD (degenerative joint disease)   . Essential hypertension 06/29/2013   D/c 'd amlodipine  08/27/2014 due to low bp and relative tachycardia    . GERD (gastroesophageal reflux disease)   . Headache(784.0)   . High blood pressure    echo 09-normal  . Lower GI bleed 06/2013   required 2 units PRBC  . Macular degeneration syndrome   . Pleural effusion 08/07/2014  . Wears glasses   . Wears hearing aid    both  . Wears partial dentures    top and bottom   Past Surgical History:  Procedure Laterality Date  . APPENDECTOMY    . CARPAL TUNNEL RELEASE  2/12   rt  . CARPAL TUNNEL RELEASE  04/23/2012   Procedure: CARPAL TUNNEL RELEASE;  Surgeon: Wynonia Sours, MD;  Location: [facility];  Service: Orthopedics;  Laterality: Left;  . EYE SURGERY     cataractas  . HIP SURGERY     left  . JOINT REPLACEMENT  2006   rt total hip x2  . KNEE SURGERY    . THYROID CYST EXCISION    . TONSILLECTOMY      Allergies  Allergen Reactions  . Celebrex [Celecoxib] Itching  . Morphine And Related Nausea Only  .  Vicodin [Hydrocodone-Acetaminophen] Itching  . Sulfa Antibiotics Rash    Outpatient Encounter Prescriptions as of 03/13/2017  Medication Sig  . acetaminophen (TYLENOL) 500 MG tablet Take 1,000 mg by mouth 3 (three) times daily.   . Artificial Tear Ointment (ARTIFICIAL TEARS) ointment Place 2 drops into the right eye at bedtime.  . cetirizine (ZYRTEC) 10 MG tablet Take 10 mg by mouth every morning.   . cloNIDine (CATAPRES) 0.1 MG tablet Take 0.1 mg by mouth 3 (three) times daily as needed.   . diltiazem (CARDIZEM) 120 MG tablet Take 120 mg by mouth daily.  Marland Kitchen docusate sodium (COLACE) 100 MG capsule Take 100 mg by mouth 2 (two) times  daily.  . fluticasone (VERAMYST) 27.5 MCG/SPRAY nasal spray Place 1 spray into the nose daily.  Marland Kitchen lisinopril (PRINIVIL,ZESTRIL) 20 MG tablet Take 20 mg by mouth daily.  . polyethylene glycol (MIRALAX / GLYCOLAX) packet Take 17 g by mouth every other day.   . ranitidine (ZANTAC) 150 MG capsule Take 150 mg by mouth daily as needed for heartburn (gerd).  . trolamine salicylate (ASPERCREME) 10 % cream Apply 1 application topically 4 (four) times daily as needed for muscle pain.  . vitamin B-12 (CYANOCOBALAMIN) 1000 MCG tablet Take 1,000 mcg by mouth daily.   Marland Kitchen zolpidem (AMBIEN) 5 MG tablet Take 1 tablet (5 mg total) by mouth at bedtime. For rest   No facility-administered encounter medications on file as of 03/13/2017.     Review of Systems  Constitutional: Positive for fatigue. Negative for activity change, appetite change, chills and fever.       Weight loss  HENT: Positive for hearing loss.   Eyes:       Poor vision  Respiratory: Negative for chest tightness and shortness of breath.   Cardiovascular: Negative for chest pain and leg swelling.  Gastrointestinal: Negative for abdominal distention, abdominal pain, constipation, diarrhea, nausea and vomiting.  Genitourinary: Negative for dysuria.  Musculoskeletal: Positive for arthralgias, back pain and gait problem.    Immunization History  Administered Date(s) Administered  . Influenza Inj Mdck Quad Pf 06/15/2016  . Influenza Split 05/28/2014  . Influenza-Unspecified 06/10/2015  . Pneumococcal Conjugate-13 03/03/2014  . Pneumococcal Polysaccharide-23 04/05/2016  . Td 10/23/2016   Pertinent  Health Maintenance Due  Topic Date Due  . INFLUENZA VACCINE  03/28/2017  . DEXA SCAN  Completed  . PNA vac Low Risk Adult  Completed   Fall Risk  12/27/2016 08/12/2015 07/15/2015 02/15/2015 01/07/2015  Falls in the past year? No Yes Yes Yes Yes  Number falls in past yr: - 1 1 1 1   Injury with Fall? - No - - Yes  Risk Factor Category  - - - High  Fall Risk High Fall Risk  Risk for fall due to : - History of fall(s);Impaired balance/gait History of fall(s) History of fall(s) History of fall(s)  Follow up - Falls evaluation completed Falls evaluation completed - Falls evaluation completed   Functional Status Survey:    Vitals:   03/13/17 1433  BP: 140/78  Pulse: 79  Resp: 16  Temp: 98.4 F (36.9 C)  TempSrc: Oral  SpO2: 96%  Weight: 87 lb (39.5 kg)   Body mass index is 15.41 kg/m. Physical Exam  Constitutional: No distress.  Frail, cachectic white female seated in chair in corner of room sleeping  HENT:  Head: Normocephalic and atraumatic.  HOH, but heard my questions better than usual today  Eyes: Pupils are equal, round, and reactive to light. Conjunctivae  are normal.  Poor vision  Neck: No JVD present.  Cardiovascular: Normal rate, regular rhythm, normal heart sounds and intact distal pulses.   Pulmonary/Chest: Effort normal and breath sounds normal.  Abdominal: Soft. Bowel sounds are normal.  Musculoskeletal: Normal range of motion.  Lymphadenopathy:    She has no cervical adenopathy.  Neurological: She is alert.  Arousable, but quickly closes her eyes again and falls asleep  Skin: Skin is warm and dry.  Psychiatric:  Flat affect    Labs reviewed:  Recent Labs  10/03/16 12/11/16  NA 140 142  K 4.3 3.8  BUN 17 14  CREATININE 0.7 0.6    Recent Labs  12/11/16  AST 19  ALT 11  ALKPHOS 79    Recent Labs  12/11/16  WBC 10.3  HGB 14.8  HCT 46  PLT 247   Lab Results  Component Value Date   TSH 4.26 11/17/2014    Assessment/Plan 1. Protein-calorie malnutrition, severe ([place]) -progressively worsening, intake very poor with weight loss, but has delusion she's gaining wt with supplement shakes when she's losing and refusing them -cont supportive care  2. Sensorineural hearing loss (SNHL) of both ears -severe, oddly could hear me today, but usually can't make out anything I saw the whole visit  and keeps yelling "I can't hear you"  3. Late onset Alzheimer's disease with behavioral disturbance -moderate, not on AD meds, cont supportive care and full adl assist  4. Bilateral exudative age-related macular degeneration, unspecified stage ([facility]) -vision quite poor and limits functionality along with hearing loss and dementia  5. Loss of weight -progressing due to poor intake and refusal of supplements, cont to encourage as tolerates, but pt with failure to thrive and poor prognosis   Family/ staff Communication: discussed with snf nurse  Labs/tests ordered:  No new  Denelle Capurro L. [place] Maher, D.O. [facility] [address], [place] [address] Cell Phone (Mon-Fri 8am-5pm):  [phone] On Call:  [phone] & follow prompts after 5pm & weekends Office Phone:  [phone] Office Fax:  [phone]

## 2017-03-17 ENCOUNTER — Inpatient Hospital Stay (HOSPITAL_COMMUNITY)
Admission: EM | Admit: 2017-03-17 | Discharge: 2017-03-19 | DRG: 534 | Disposition: A | Discharge status: Hospice | Payer: Medicare Other | Attending: Internal Medicine | Admitting: Internal Medicine

## 2017-03-17 ENCOUNTER — Encounter (HOSPITAL_COMMUNITY): Payer: Self-pay

## 2017-03-17 ENCOUNTER — Emergency Department (HOSPITAL_COMMUNITY): Payer: Medicare Other

## 2017-03-17 DIAGNOSIS — F0390 Unspecified dementia without behavioral disturbance: Secondary | ICD-10-CM | POA: Diagnosis present

## 2017-03-17 DIAGNOSIS — M79604 Pain in right leg: Secondary | ICD-10-CM | POA: Diagnosis not present

## 2017-03-17 DIAGNOSIS — Z96643 Presence of artificial hip joint, bilateral: Secondary | ICD-10-CM | POA: Diagnosis present

## 2017-03-17 DIAGNOSIS — R4182 Altered mental status, unspecified: Secondary | ICD-10-CM | POA: Diagnosis not present

## 2017-03-17 DIAGNOSIS — F419 Anxiety disorder, unspecified: Secondary | ICD-10-CM | POA: Diagnosis present

## 2017-03-17 DIAGNOSIS — Y92129 Unspecified place in nursing home as the place of occurrence of the external cause: Secondary | ICD-10-CM

## 2017-03-17 DIAGNOSIS — I10 Essential (primary) hypertension: Secondary | ICD-10-CM | POA: Diagnosis not present

## 2017-03-17 DIAGNOSIS — H9190 Unspecified hearing loss, unspecified ear: Secondary | ICD-10-CM | POA: Diagnosis present

## 2017-03-17 DIAGNOSIS — Z515 Encounter for palliative care: Secondary | ICD-10-CM | POA: Diagnosis present

## 2017-03-17 DIAGNOSIS — W19XXXA Unspecified fall, initial encounter: Secondary | ICD-10-CM | POA: Diagnosis not present

## 2017-03-17 DIAGNOSIS — K219 Gastro-esophageal reflux disease without esophagitis: Secondary | ICD-10-CM | POA: Diagnosis present

## 2017-03-17 DIAGNOSIS — Z7189 Other specified counseling: Secondary | ICD-10-CM

## 2017-03-17 DIAGNOSIS — S72001A Fracture of unspecified part of neck of right femur, initial encounter for closed fracture: Secondary | ICD-10-CM | POA: Diagnosis not present

## 2017-03-17 DIAGNOSIS — F32A Depression, unspecified: Secondary | ICD-10-CM | POA: Diagnosis present

## 2017-03-17 DIAGNOSIS — Z885 Allergy status to narcotic agent status: Secondary | ICD-10-CM | POA: Diagnosis not present

## 2017-03-17 DIAGNOSIS — F0281 Dementia in other diseases classified elsewhere with behavioral disturbance: Secondary | ICD-10-CM | POA: Diagnosis not present

## 2017-03-17 DIAGNOSIS — F329 Major depressive disorder, single episode, unspecified: Secondary | ICD-10-CM | POA: Diagnosis present

## 2017-03-17 DIAGNOSIS — G301 Alzheimer's disease with late onset: Secondary | ICD-10-CM | POA: Diagnosis not present

## 2017-03-17 DIAGNOSIS — F064 Anxiety disorder due to known physiological condition: Secondary | ICD-10-CM | POA: Diagnosis not present

## 2017-03-17 DIAGNOSIS — H3530 Unspecified macular degeneration: Secondary | ICD-10-CM | POA: Diagnosis present

## 2017-03-17 DIAGNOSIS — Z87891 Personal history of nicotine dependence: Secondary | ICD-10-CM | POA: Diagnosis not present

## 2017-03-17 DIAGNOSIS — Z96651 Presence of right artificial knee joint: Secondary | ICD-10-CM | POA: Diagnosis present

## 2017-03-17 DIAGNOSIS — T148XXA Other injury of unspecified body region, initial encounter: Secondary | ICD-10-CM | POA: Diagnosis not present

## 2017-03-17 DIAGNOSIS — S7290XA Unspecified fracture of unspecified femur, initial encounter for closed fracture: Secondary | ICD-10-CM | POA: Diagnosis present

## 2017-03-17 DIAGNOSIS — G5600 Carpal tunnel syndrome, unspecified upper limb: Secondary | ICD-10-CM | POA: Diagnosis present

## 2017-03-17 DIAGNOSIS — K210 Gastro-esophageal reflux disease with esophagitis: Secondary | ICD-10-CM | POA: Diagnosis not present

## 2017-03-17 DIAGNOSIS — Y92099 Unspecified place in other non-institutional residence as the place of occurrence of the external cause: Secondary | ICD-10-CM

## 2017-03-17 DIAGNOSIS — E538 Deficiency of other specified B group vitamins: Secondary | ICD-10-CM | POA: Diagnosis present

## 2017-03-17 DIAGNOSIS — Z66 Do not resuscitate: Secondary | ICD-10-CM | POA: Diagnosis present

## 2017-03-17 DIAGNOSIS — Z79899 Other long term (current) drug therapy: Secondary | ICD-10-CM

## 2017-03-17 DIAGNOSIS — S72332A Displaced oblique fracture of shaft of left femur, initial encounter for closed fracture: Secondary | ICD-10-CM | POA: Diagnosis not present

## 2017-03-17 DIAGNOSIS — H353230 Exudative age-related macular degeneration, bilateral, stage unspecified: Secondary | ICD-10-CM | POA: Diagnosis not present

## 2017-03-17 DIAGNOSIS — S72009A Fracture of unspecified part of neck of unspecified femur, initial encounter for closed fracture: Secondary | ICD-10-CM | POA: Diagnosis present

## 2017-03-17 DIAGNOSIS — S72331A Displaced oblique fracture of shaft of right femur, initial encounter for closed fracture: Secondary | ICD-10-CM

## 2017-03-17 DIAGNOSIS — S92153A Displaced avulsion fracture (chip fracture) of unspecified talus, initial encounter for closed fracture: Secondary | ICD-10-CM | POA: Diagnosis not present

## 2017-03-17 DIAGNOSIS — Z882 Allergy status to sulfonamides status: Secondary | ICD-10-CM

## 2017-03-17 DIAGNOSIS — N390 Urinary tract infection, site not specified: Secondary | ICD-10-CM | POA: Diagnosis not present

## 2017-03-17 DIAGNOSIS — M1990 Unspecified osteoarthritis, unspecified site: Secondary | ICD-10-CM | POA: Diagnosis present

## 2017-03-17 DIAGNOSIS — Z886 Allergy status to analgesic agent status: Secondary | ICD-10-CM

## 2017-03-17 DIAGNOSIS — S72401A Unspecified fracture of lower end of right femur, initial encounter for closed fracture: Secondary | ICD-10-CM | POA: Diagnosis not present

## 2017-03-17 DIAGNOSIS — S728X9A Other fracture of unspecified femur, initial encounter for closed fracture: Secondary | ICD-10-CM | POA: Diagnosis not present

## 2017-03-17 HISTORY — DX: Unspecified dementia, unspecified severity, without behavioral disturbance, psychotic disturbance, mood disturbance, and anxiety: F03.90

## 2017-03-17 LAB — CBC WITH DIFFERENTIAL/PLATELET
BASOS ABS: 0.1 10*3/uL (ref 0.0–0.1)
Basophils Relative: 1 %
Eosinophils Absolute: 0.1 10*3/uL (ref 0.0–0.7)
Eosinophils Relative: 1 %
HCT: 40 % (ref 36.0–46.0)
HEMOGLOBIN: 13.3 g/dL (ref 12.0–15.0)
LYMPHS ABS: 1.8 10*3/uL (ref 0.7–4.0)
LYMPHS PCT: 17 %
MCH: 33.8 pg (ref 26.0–34.0)
MCHC: 33.3 g/dL (ref 30.0–36.0)
MCV: 101.8 fL — AB (ref 78.0–100.0)
Monocytes Absolute: 0.6 10*3/uL (ref 0.1–1.0)
Monocytes Relative: 6 %
NEUTROS PCT: 75 %
Neutro Abs: 8 10*3/uL — ABNORMAL HIGH (ref 1.7–7.7)
Platelets: 359 10*3/uL (ref 150–400)
RBC: 3.93 MIL/uL (ref 3.87–5.11)
RDW: 14.4 % (ref 11.5–15.5)
WBC: 10.5 10*3/uL (ref 4.0–10.5)

## 2017-03-17 LAB — BASIC METABOLIC PANEL
ANION GAP: 11 (ref 5–15)
BUN: 14 mg/dL (ref 6–20)
CO2: 27 mmol/L (ref 22–32)
Calcium: 10 mg/dL (ref 8.9–10.3)
Chloride: 100 mmol/L — ABNORMAL LOW (ref 101–111)
Creatinine, Ser: 0.59 mg/dL (ref 0.44–1.00)
GFR calc Af Amer: >60 mL/min (ref >60)
GFR calc non Af Amer: >60 mL/min (ref >60)
GLUCOSE: 114 mg/dL — AB (ref 65–99)
POTASSIUM: 3.6 mmol/L (ref 3.5–5.1)
Sodium: 138 mmol/L (ref 135–145)

## 2017-03-17 MED ORDER — TRAMADOL HCL 50 MG PO TABS
50.0000 mg | ORAL_TABLET | Freq: Two times a day (BID) | ORAL | Status: DC | PRN
  Administered 2017-03-17: 50 mg via ORAL
  Filled 2017-03-17: qty 1

## 2017-03-17 MED ORDER — ACETAMINOPHEN 650 MG RE SUPP: 650.0000 mg | Freq: Four times a day (QID) | RECTAL | Status: DC | PRN

## 2017-03-17 MED ORDER — CLONIDINE HCL 0.1 MG PO TABS: 0.1000 mg | ORAL_TABLET | Freq: Three times a day (TID) | ORAL | Status: DC | PRN

## 2017-03-17 MED ORDER — ONDANSETRON HCL 4 MG/2ML IJ SOLN: 4.0000 mg | Freq: Four times a day (QID) | INTRAMUSCULAR | Status: DC | PRN

## 2017-03-17 MED ORDER — LEVALBUTEROL HCL 0.63 MG/3ML IN NEBU: 0.6300 mg | INHALATION_SOLUTION | Freq: Four times a day (QID) | RESPIRATORY_TRACT | Status: DC | PRN

## 2017-03-17 MED ORDER — ZOLPIDEM TARTRATE 5 MG PO TABS: 5.0000 mg | ORAL_TABLET | Freq: Every day | ORAL | Status: DC

## 2017-03-17 MED ORDER — DILTIAZEM HCL 60 MG PO TABS
120.0000 mg | ORAL_TABLET | Freq: Every day | ORAL | Status: DC
  Filled 2017-03-17: qty 2

## 2017-03-17 MED ORDER — POLYETHYLENE GLYCOL 3350 17 G PO PACK
17.0000 g | PACK | ORAL | Status: DC
  Administered 2017-03-18: 17 g via ORAL
  Filled 2017-03-17: qty 1

## 2017-03-17 MED ORDER — VITAMIN B-12 1000 MCG PO TABS
1000.0000 ug | ORAL_TABLET | Freq: Every day | ORAL | Status: DC
  Administered 2017-03-17 – 2017-03-19 (×3): 1000 ug via ORAL
  Filled 2017-03-17 (×3): qty 1

## 2017-03-17 MED ORDER — FENTANYL CITRATE (PF) 100 MCG/2ML IJ SOLN: 12.5000 ug | INTRAMUSCULAR | Status: DC | PRN

## 2017-03-17 MED ORDER — EMPTY CONTAINERS FLEXIBLE MISC
25.0000 ug/h | Status: DC
Start: 2017-03-17 — End: 2017-03-17
  Administered 2017-03-17: 25 ug/h via INTRAVENOUS
  Filled 2017-03-17: qty 100

## 2017-03-17 MED ORDER — ACETAMINOPHEN 325 MG PO TABS
650.0000 mg | ORAL_TABLET | Freq: Four times a day (QID) | ORAL | Status: DC | PRN
  Administered 2017-03-18: 650 mg via ORAL
  Filled 2017-03-17 (×3): qty 2

## 2017-03-17 MED ORDER — DILTIAZEM HCL ER COATED BEADS 120 MG PO CP24
120.0000 mg | ORAL_CAPSULE | Freq: Every day | ORAL | Status: DC
  Administered 2017-03-17 – 2017-03-19 (×3): 120 mg via ORAL
  Filled 2017-03-17 (×3): qty 1

## 2017-03-17 MED ORDER — LORATADINE 10 MG PO TABS
10.0000 mg | ORAL_TABLET | Freq: Every day | ORAL | Status: DC
  Administered 2017-03-17 – 2017-03-19 (×3): 10 mg via ORAL
  Filled 2017-03-17 (×3): qty 1

## 2017-03-17 MED ORDER — LORAZEPAM 2 MG/ML IJ SOLN
0.5000 mg | INTRAMUSCULAR | Status: DC | PRN
  Administered 2017-03-19: 0.5 mg via INTRAVENOUS
  Filled 2017-03-17: qty 1

## 2017-03-17 MED ORDER — DOCUSATE SODIUM 100 MG PO CAPS
100.0000 mg | ORAL_CAPSULE | Freq: Two times a day (BID) | ORAL | Status: DC
  Administered 2017-03-17 – 2017-03-18 (×4): 100 mg via ORAL
  Filled 2017-03-17 (×5): qty 1

## 2017-03-17 MED ORDER — LISINOPRIL 20 MG PO TABS
20.0000 mg | ORAL_TABLET | Freq: Every day | ORAL | Status: DC
  Administered 2017-03-17 – 2017-03-19 (×3): 20 mg via ORAL
  Filled 2017-03-17 (×3): qty 1

## 2017-03-17 MED ORDER — FLUTICASONE PROPIONATE 50 MCG/ACT NA SUSP
1.0000 | Freq: Every day | NASAL | Status: DC
Start: 2017-03-17 — End: 2017-03-19
  Administered 2017-03-17: 23:00:00 1 via NASAL
  Filled 2017-03-17: qty 16

## 2017-03-17 MED ORDER — SIMETHICONE 80 MG PO CHEW
160.0000 mg | CHEWABLE_TABLET | Freq: Two times a day (BID) | ORAL | Status: DC
  Administered 2017-03-17 – 2017-03-19 (×5): 160 mg via ORAL
  Filled 2017-03-17 (×5): qty 2

## 2017-03-17 MED ORDER — ONDANSETRON HCL 4 MG PO TABS: 4.0000 mg | ORAL_TABLET | Freq: Four times a day (QID) | ORAL | Status: DC | PRN

## 2017-03-17 MED ORDER — OXYCODONE HCL 5 MG PO TABS
5.0000 mg | ORAL_TABLET | ORAL | Status: DC | PRN
  Administered 2017-03-17 – 2017-03-19 (×5): 5 mg via ORAL
  Filled 2017-03-17 (×6): qty 1

## 2017-03-17 NOTE — Progress Notes (Signed)
Second nurse to examine patient's skin upon admission. Assessed with primary nurse, [employer]. Patient's skin is dry, flaky, and thin. Ecchymoses noted on lower legs and arms. Blanchable redness on sacrum. No major skin issues, no breakdown.

## 2017-03-17 NOTE — ED Notes (Signed)
Bed: WA20 Expected date:  Expected time:  Means of arrival:  Comments: EMS 83 yo female fall/hip injury

## 2017-03-17 NOTE — Consult Note (Addendum)
Consultation Note Date: 03/18/2017   Patient Name: [name]  DOB: Sep 23, 1916  MRN: [id number]  Age / Sex: 83 y.o., female  PCP: Cheryl Curry, DO Referring Physician: Kerney Elbe, DO  Reason for Consultation: Disposition, Establishing goals of care, Hospice Evaluation and Pain control  HPI/Patient Profile: 83 y.o. female  with past medical history of dementia, hypertension, macular degeneration, hearing loss, resident of long-term care facility admitted on 03/17/2017 with hip fracture related to fall.  Her healthcare power of attorney was present at the hospital and elected for comfort care. It is noted that her [facility] POA desires to have her transition back to [facility] when she is discharged. Palliative consulted for goals of care and symptom assessment.   Clinical Assessment and Goals of Care: [name] is sleeping during my assessment. She has been noted to have dementia and cannot relay any history. Therefore I did not wake her as she is resting comfortably and did not arouse with gentle verbal and tactile stimulation.  She does not appear in any distress on exam.   Fox noted for full comfort care and note that HCPOA was at bedside (name not document) and other physicians have already discussed plan for comfort care and transition back to [facility]. - I attempted to call individuals listed as her HCPOA on her advanced directive that we have on file.  I called and left message for [name] as well as [name].  I later received call back from [name] stating that she is no longer healthcare power of attorney and is her belief that her former caretaker [name] is now serving in this role.  There is a copy of a document naming [name] has [place], but not healthcare power of attorney on file.  [name] additionally  gave me a phone number for [name]'s granddaughter, [name], whom she said would be able to tell me who his current HCPOA. - I called and spoke with patient's granddaughter, [name] [phone] or [phone]).  She reports that she also believes that [name] as now serving as role of healthcare power of attorney. She also reports that she was told that her grandmother had fallen, now has a broken hip, and the goal moving forward is for comfort and to get her back to [facility]. I suggested to her with this goal in mind that I think that she would best be served with hospice support on discharge. She reports that her family would be in agreement with this plan of care moving forward. - It seems clear to me that everyone I have talked with is in agreement that the best care plan for [name] moving forward is for comfort care with a plan to transition back to [facility] with the support of hospice.  I think the question will be who is legally healthcare power of attorney in order to sign paperwork to arrange for this to happen.  I will await return  calls from [name] as well as [name] to try to determine who is current healthcare power of attorney to discuss election of her hospice benefit when she is discharged back to [facility].    Code Status/Advance Care Planning:  DNR   Symptom Management:   Patient currently ordered tramadol.  Plan noted for fentanyl continuous infusion, however this was later d/c.  She is currently resting comfortably, so will utilize prn dosing rather than restart continuous IV medications.  RN reports took pills easily earlier today.  Plan for addition of oxycodone Q3H prn, backup of fentanyl 12.5-25mcg Q1H as needed if oral route is ineffective or if she is not able to take PO.   Palliative Prophylaxis:   Bowel Regimen and Frequent Pain Assessment  Additional Recommendations (Limitations, Scope, Preferences):  Full Comfort  Care  Psycho-social/Spiritual:   Desire for further Chaplaincy support:no  Additional Recommendations: Education on Hospice  Prognosis:   < 6 months  Discharge Planning: Most likely, plan will be [facility] with Hospice      Primary Diagnoses: Present on Admission: . Femur fracture ([place]) . Essential hypertension . Dementia . GERD (gastroesophageal reflux disease) . B12 deficiency . Macular degeneration . Depression . Anxiety disorder . Hip fracture ([place])   I have reviewed the medical record, interviewed the patient and family, and examined the patient. The following aspects are pertinent.  Past Medical History:  Diagnosis Date  . Arthritis   . Carpal tunnel syndrome on left   . Dementia   . Diverticulosis   . DJD (degenerative joint disease)   . Essential hypertension 06/29/2013   D/c 'd amlodipine  08/27/2014 due to low bp and relative tachycardia    . GERD (gastroesophageal reflux disease)   . Headache(784.0)   . High blood pressure    echo 09-normal  . Lower GI bleed 06/2013   required 2 units PRBC  . Macular degeneration syndrome   . Pleural effusion 08/07/2014  . Wears glasses   . Wears hearing aid    both  . Wears partial dentures    top and bottom   Social History   Social History  . Marital status: Single    Spouse name: N/A  . Number of children: N/A  . Years of education: N/A   Social History Main Topics  . Smoking status: Former Smoker    Quit date: 07/07/1973  . Smokeless tobacco: Never Used  . Alcohol use Yes  . Drug use: No  . Sexual activity: No   Other Topics Concern  . None   Social History Narrative   Patient is Widowed.  Occupation: Housewife  Lives in single level home ([facility]) at [place] since 1993. Lives alone, has caregiver assistance &-12noon, 5-9pm daily [place]). Moved to skill unit 08/12/2014   Stopped smoking 1970s. Minimal alcohol history   Patient has Advanced  planning documents:  DNR, HCPOA [name])                     Family History  Problem Relation Age of Onset  . Heart attack Mother   . Diabetes Father    Scheduled Meds: . diltiazem  120 mg Oral Daily  . docusate sodium  100 mg Oral BID  . fluticasone  1 spray Each Nare Daily  . lisinopril  20 mg Oral Daily  . loratadine  10 mg Oral Daily  . polyethylene glycol  17 g Oral QODAY  . simethicone  160 mg Oral BID  . vitamin B-12  1,000 mcg Oral Daily  . zolpidem  5 mg Oral QHS   Continuous Infusions: PRN Meds:.acetaminophen **OR** acetaminophen, cloNIDine, fentaNYL (SUBLIMAZE) injection, levalbuterol, LORazepam, ondansetron **OR** ondansetron (ZOFRAN) IV, oxyCODONE Medications Prior to Admission:  Prior to Admission medications   Medication Sig Start Date End Date Taking? Authorizing Provider  acetaminophen (TYLENOL) 500 MG tablet Take 1,000 mg by mouth 3 (three) times daily.    Yes [provider]  Artificial Tear Ointment (ARTIFICIAL TEARS) ointment Place 2 drops into the right eye at bedtime.   Yes [provider]  cetirizine (ZYRTEC) 10 MG tablet Take 10 mg by mouth every morning.    Yes [provider]  cloNIDine (CATAPRES) 0.1 MG tablet Take 0.1 mg by mouth 3 (three) times daily as needed (SBP >185).    Yes [provider]  diltiazem (CARDIZEM) 120 MG tablet Take 120 mg by mouth daily.   Yes [provider]  docusate sodium (COLACE) 100 MG capsule Take 100 mg by mouth 2 (two) times daily.   Yes [provider]  ENSURE (ENSURE) Take 237 mLs by mouth 3 (three) times daily.   Yes [provider]  fluticasone (VERAMYST) 27.5 MCG/SPRAY nasal spray Place 1 spray into the nose daily.   Yes [provider]  lisinopril (PRINIVIL,ZESTRIL) 20 MG tablet Take 20 mg by mouth daily.   Yes [provider]  polyethylene glycol (MIRALAX / GLYCOLAX) packet Take 17 g by mouth every other day.    Yes [provider]  ranitidine (ZANTAC) 150 MG capsule Take 150 mg by mouth daily as needed for heartburn (gerd).   Yes [provider]  simethicone (MYLICON) 80 MG chewable tablet Chew 160 mg by mouth 2 (two) times daily.   Yes [provider]  trolamine salicylate (ASPERCREME) 10 % cream Apply 1 application topically 4 (four) times daily as needed for muscle pain.   Yes [provider]  vitamin B-12 (CYANOCOBALAMIN) 1000 MCG tablet Take 1,000 mcg by mouth daily.    Yes [provider]  zolpidem (AMBIEN) 5 MG tablet Take 1 tablet (5 mg total) by mouth at bedtime. For rest 12/13/16  Yes Estill Dooms, MD   Allergies  Allergen Reactions  . Celebrex [Celecoxib] Itching  . Morphine And Related Nausea Only  . Vicodin [Hydrocodone-Acetaminophen] Itching  . Sulfa Antibiotics Rash   Review of Systems  Unable to obtain  Physical Exam  General: Sleeping.  Does not arouse to gentle verbal or tactile stimulation HEENT: No bruits, no goiter, no JVD Heart: Regular rate and rhythm. No murmur appreciated. Lungs: Good air movement, clear Abdomen: Soft, nontender, nondistended, positive bowel sounds.  Skin: Warm and dry  Vital Signs: BP 119/67 (BP Location: Left Arm)   Pulse 96   Temp 98.7 F (37.1 C) (Oral)   Resp 15   Ht 5' (1.524 m)   Wt 38.6 kg (85 lb)   SpO2 97%   BMI 16.60 kg/m  Pain Assessment: PAINAD   Pain Score: Asleep   SpO2: SpO2: 97 % O2 Device:SpO2: 97 % O2 Flow Rate: .   IO: Intake/output summary:  Intake/Output Summary (Last 24 hours) at 03/18/17 0839 Last data filed at 03/18/17 0540  Gross per 24 hour  Intake              290 ml  Output              100 ml  Net              190 ml    LBM:   Baseline Weight: Weight: 39.5 kg (87 lb) Most recent weight: Weight: 38.6 kg (85 lb)     Palliative Assessment/Data:    Time Total: 60 Greater than 50%  of this time was spent counseling and coordinating care related to the above  assessment and plan.  Signed by: Micheline Rough, MD   Please contact Palliative Medicine Team phone at [phone] for questions and concerns.  For individual provider: See Shea Evans

## 2017-03-17 NOTE — ED Notes (Signed)
Fentanyl drip stop by previous RN at 0600. 5063mcg Fent wasted in pyxis per pharmacy and charge nurse.

## 2017-03-17 NOTE — ED Triage Notes (Signed)
PT RECEIVED FROM [facility] VIA EMS FOR AN UNWITNESSED FALL. PER EMS, PT WAS FOUND ON THE FLOOR IN HER ROOM. PT WAS PICKED UP AND PLACED BACK IN BED. PT HAS DEFORMITY TO THE RIGHT LEG. PT HAS A HX OF DEMENTIA AND A DNR. POA AT THE BEDSIDE, AND REQUESTING COMFORT CARE AT THIS TIME. FENTANYL 100 MCG GIVEN PTA. FENTANYL 50 MCG GIVEN, AS ORDERED BY DR. Florina Ou, ERA.

## 2017-03-17 NOTE — ED Provider Notes (Signed)
[facility] DEPT Provider Note: Georgena Spurling, MD, FACEP  CSN: [id number] MRN: [id number] ARRIVAL: 03/17/17 at [place]: [id number]/WA20   CHIEF COMPLAINT  Fall   HISTORY OF PRESENT ILLNESS  [name] is a 83 y.o. female with dementia and DO NOT RESUSCITATE status who had an unwitnessed fall at her nursing home this morning. She was found on the ground in apparent pain. EMS notes crepitus of the right femur and moaning by the patient when the right femur is moved. The right lower extremity was immobilized with bed sheets prior to transport to the ED. EMS notes pulses remain normal in the right foot. She was given 100 micrograms of fentanyl IV prior to arrival and another 50 micrograms on arrival with improvement in her pain.   Past Medical History:  Diagnosis Date  . Arthritis   . Carpal tunnel syndrome on left   . Dementia   . Diverticulosis   . DJD (degenerative joint disease)   . Essential hypertension 06/29/2013   D/c 'd amlodipine  08/27/2014 due to low bp and relative tachycardia    . GERD (gastroesophageal reflux disease)   . Headache(784.0)   . High blood pressure    echo 09-normal  . Lower GI bleed 06/2013   required 2 units PRBC  . Macular degeneration syndrome   . Pleural effusion 08/07/2014  . Wears glasses   . Wears hearing aid    both  . Wears partial dentures    top and bottom    Past Surgical History:  Procedure Laterality Date  . APPENDECTOMY    . CARPAL TUNNEL RELEASE  2/12   rt  . CARPAL TUNNEL RELEASE  04/23/2012   Procedure: CARPAL TUNNEL RELEASE;  Surgeon: Wynonia Sours, MD;  Location: [facility];  Service: Orthopedics;  Laterality: Left;  . EYE SURGERY     cataractas  . HIP SURGERY     left  . JOINT REPLACEMENT  2006   rt total hip x2  . KNEE SURGERY    . THYROID CYST EXCISION    . TONSILLECTOMY      Family History  Problem Relation Age of Onset  . Heart attack Mother   . Diabetes Father     Social History    Substance Use Topics  . Smoking status: Former Smoker    Quit date: 07/07/1973  . Smokeless tobacco: Never Used  . Alcohol use Yes    Prior to Admission medications   Medication Sig Start Date End Date Taking? Authorizing Provider  acetaminophen (TYLENOL) 500 MG tablet Take 1,000 mg by mouth 3 (three) times daily.    Yes [provider]  Artificial Tear Ointment (ARTIFICIAL TEARS) ointment Place 2 drops into the right eye at bedtime.   Yes [provider]  cetirizine (ZYRTEC) 10 MG tablet Take 10 mg by mouth every morning.    Yes [provider]  cloNIDine (CATAPRES) 0.1 MG tablet Take 0.1 mg by mouth 3 (three) times daily as needed (SBP >185).    Yes [provider]  diltiazem (CARDIZEM) 120 MG tablet Take 120 mg by mouth daily.   Yes [provider]  docusate sodium (COLACE) 100 MG capsule Take 100 mg by mouth 2 (two) times daily.   Yes [provider]  ENSURE (ENSURE) Take 237 mLs by mouth 3 (three) times daily.   Yes [provider]  fluticasone (VERAMYST) 27.5 MCG/SPRAY nasal spray Place 1 spray into the nose daily.  Yes [provider]  lisinopril (PRINIVIL,ZESTRIL) 20 MG tablet Take 20 mg by mouth daily.   Yes [provider]  polyethylene glycol (MIRALAX / GLYCOLAX) packet Take 17 g by mouth every other day.    Yes [provider]  ranitidine (ZANTAC) 150 MG capsule Take 150 mg by mouth daily as needed for heartburn (gerd).   Yes [provider]  simethicone (MYLICON) 80 MG chewable tablet Chew 160 mg by mouth 2 (two) times daily.   Yes [provider]  trolamine salicylate (ASPERCREME) 10 % cream Apply 1 application topically 4 (four) times daily as needed for muscle pain.   Yes [provider]  vitamin B-12 (CYANOCOBALAMIN) 1000 MCG tablet Take 1,000 mcg by mouth daily.    Yes [provider]  zolpidem (AMBIEN) 5 MG tablet Take 1 tablet (5 mg total) by  mouth at bedtime. For rest 12/13/16  Yes Estill Dooms, MD    Allergies Celebrex [celecoxib]; Morphine and related; Vicodin [hydrocodone-acetaminophen]; and Sulfa antibiotics   REVIEW OF SYSTEMS     PHYSICAL EXAMINATION  Initial Vital Signs Blood pressure (!) 172/98, pulse 89, temperature 97.7 F (36.5 C), temperature source Oral, resp. rate 15, height 5' (1.524 m), weight 39.5 kg (87 lb), SpO2 94 %.  Examination General: Well-developed, well-nourished female in no acute distress; appearance consistent with age of record HENT: normocephalic; atraumatic Eyes: pupils equal, round and reactive to light Neck: supple Heart: regular rate and rhythm Lungs: clear to auscultation bilaterally Abdomen: soft; nondistended; nontender; bowel sounds present Extremities: deformity and tenderness of right thigh; DP and PT pulses +2 distally Neurologic: Somnolent but arouses to painful stimuli  Skin: Warm and dry; acute skin tear right lower leg   RESULTS  Summary of this visit's results, reviewed by myself:   EKG Interpretation  Date/Time:    Ventricular Rate:    PR Interval:    QRS Duration:   QT Interval:    QTC Calculation:   R Axis:     Text Interpretation:        Laboratory Studies: Results for orders placed or performed during the hospital encounter of 03/17/17 (from the past 24 hour(s))  CBC with Differential/Platelet     Status: Abnormal   Collection Time: 03/17/17  5:28 AM  Result Value Ref Range   WBC 10.5 4.0 - 10.5 K/uL   RBC 3.93 3.87 - 5.11 MIL/uL   Hemoglobin 13.3 12.0 - 15.0 g/dL   HCT 40.0 36.0 - 46.0 %   MCV 101.8 (H) 78.0 - 100.0 fL   MCH 33.8 26.0 - 34.0 pg   MCHC 33.3 30.0 - 36.0 g/dL   RDW 14.4 11.5 - 15.5 %   Platelets 359 150 - 400 K/uL   Neutrophils Relative % 75 %   Neutro Abs 8.0 (H) 1.7 - 7.7 K/uL   Lymphocytes Relative 17 %   Lymphs Abs 1.8 0.7 - 4.0 K/uL   Monocytes Relative 6 %   Monocytes Absolute 0.6 0.1 - 1.0 K/uL   Eosinophils  Relative 1 %   Eosinophils Absolute 0.1 0.0 - 0.7 K/uL   Basophils Relative 1 %   Basophils Absolute 0.1 0.0 - 0.1 K/uL  Basic metabolic panel     Status: Abnormal   Collection Time: 03/17/17  5:28 AM  Result Value Ref Range   Sodium 138 135 - 145 mmol/L   Potassium 3.6 3.5 - 5.1 mmol/L   Chloride 100 (L) 101 - 111 mmol/L   CO2 27  22 - 32 mmol/L   Glucose, Bld 114 (H) 65 - 99 mg/dL   BUN 14 6 - 20 mg/dL   Creatinine, Ser 0.59 0.44 - 1.00 mg/dL   Calcium 10.0 8.9 - 10.3 mg/dL   GFR calc non Af Amer >60 >60 mL/min   GFR calc Af Amer >60 >60 mL/min   Anion gap 11 5 - 15   Imaging Studies: Dg Pelvis 1-2 Views  Result Date: 03/17/2017 CLINICAL DATA:  Right femoral deformity after fall EXAM: RIGHT FEMUR 2 VIEWS; PELVIS - 1-2 VIEW COMPARISON:  None. FINDINGS: There are bilateral hip arthroplasties and right total knee arthroplasty. There is a oblique fracture of the distal right femur with medial and posterior displacement. The right total knee arthroplasty remains approximated. There is no pelvic fracture or diastasis. There is marked left convex lumbar scoliosis. Extensive vascular calcification. Findings of prior hernia repair. IMPRESSION: Oblique fracture of the distal right femur with posterior and medial displacement. The right total hip and knee arthroplasties remain approximated. Electronically Signed   By: Ulyses Jarred M.D.   On: 03/17/2017 05:31   Dg Femur, Min 2 Views Right  Result Date: 03/17/2017 CLINICAL DATA:  Right femoral deformity after fall EXAM: RIGHT FEMUR 2 VIEWS; PELVIS - 1-2 VIEW COMPARISON:  None. FINDINGS: There are bilateral hip arthroplasties and right total knee arthroplasty. There is a oblique fracture of the distal right femur with medial and posterior displacement. The right total knee arthroplasty remains approximated. There is no pelvic fracture or diastasis. There is marked left convex lumbar scoliosis. Extensive vascular calcification. Findings of prior hernia  repair. IMPRESSION: Oblique fracture of the distal right femur with posterior and medial displacement. The right total hip and knee arthroplasties remain approximated. Electronically Signed   By: Ulyses Jarred M.D.   On: 03/17/2017 05:31    ED COURSE  Nursing notes and initial vitals signs, including pulse oximetry, reviewed.  Vitals:   03/17/17 0434 03/17/17 0441 03/17/17 0449  BP:  (!) 172/98   Pulse:  89   Resp:  15   Temp:  97.7 F (36.5 C)   TempSrc:  Oral   SpO2: 95% 94%   Weight:   39.5 kg (87 lb)  Height:   5' (1.524 m)   6:36 AM Patient started on fentanyl drip for comfort care after discussion with her healthcare power of attorney. She was made in-facility DO NOT RESUSCITATE. [facility] hospitalist to admit for end-of-life care.  PROCEDURES    ED DIAGNOSES     ICD-10-CM   1. Fall at nursing home, initial encounter W19.XXXA    Y92.129   2. Fall W19.XXXA DG FEMUR, MIN 2 VIEWS RIGHT    DG FEMUR, MIN 2 VIEWS RIGHT    DG Pelvis 1-2 Views    DG Pelvis 1-2 Views  3. Closed displaced oblique fracture of shaft of right femur, initial encounter ([place]) S72.331A        Sherron Mapp, Jenny Reichmann, MD 03/17/17 [phone]

## 2017-03-17 NOTE — H&P (Signed)
[facility] History and Physical  REIS PIENTA HUD:[id number] DOB: 12-Aug-1917 DOA: 03/17/2017  Referring physician:   PCP: Gayland Curry, DO   Chief Complaint: Fall   HPI:   83 year old female with a history of mild dementia, essential hypertension, macular degeneration, hearing loss, who presents to the ER from [place] home after a fall. Patient was found sitting on the floor at 3 AM. Patient right leg seemed to be rotated externally. Patient unable to provide any history, because of underlying dementia. Patient was brought in via EMS. Patient found to be moaning and groaning in pain. ED course Blood pressure (!) 172/98, pulse 89, temperature 97.7 F (36.5 C) X-ray shows fracture of distal right femur with posterior and medial displacement. The right total hip and knee arthroplasties remain approximated. Patient's power of attorney by the bedside and requested patient to be made comfort care. Patient will be admitted to orthopedic floor. Palliative care has been consulted     Review of Systems: negative for the following   Unable to provide history due to dementia      Past Medical History:  Diagnosis Date  . Arthritis   . Carpal tunnel syndrome on left   . Dementia   . Diverticulosis   . DJD (degenerative joint disease)   . Essential hypertension 06/29/2013   D/c 'd amlodipine  08/27/2014 due to low bp and relative tachycardia    . GERD (gastroesophageal reflux disease)   . Headache(784.0)   . High blood pressure    echo 09-normal  . Lower GI bleed 06/2013   required 2 units PRBC  . Macular degeneration syndrome   . Pleural effusion 08/07/2014  . Wears glasses   . Wears hearing aid    both  . Wears partial dentures    top and bottom     Past Surgical History:  Procedure Laterality Date  . APPENDECTOMY    . CARPAL TUNNEL RELEASE  2/12   rt  . CARPAL TUNNEL RELEASE  04/23/2012   Procedure: CARPAL TUNNEL RELEASE;  Surgeon: Wynonia Sours,  MD;  Location: [facility];  Service: Orthopedics;  Laterality: Left;  . EYE SURGERY     cataractas  . HIP SURGERY     left  . JOINT REPLACEMENT  2006   rt total hip x2  . KNEE SURGERY    . THYROID CYST EXCISION    . TONSILLECTOMY        Social History:  reports that she quit smoking about 43 years ago. She has never used smokeless tobacco. She reports that she drinks alcohol. She reports that she does not use drugs.    Allergies  Allergen Reactions  . Celebrex [Celecoxib] Itching  . Morphine And Related Nausea Only  . Vicodin [Hydrocodone-Acetaminophen] Itching  . Sulfa Antibiotics Rash    Family History  Problem Relation Age of Onset  . Heart attack Mother   . Diabetes Father          Prior to Admission medications   Medication Sig Start Date End Date Taking? Authorizing Provider  acetaminophen (TYLENOL) 500 MG tablet Take 1,000 mg by mouth 3 (three) times daily.    Yes [provider]  Artificial Tear Ointment (ARTIFICIAL TEARS) ointment Place 2 drops into the right eye at bedtime.   Yes [provider]  cetirizine (ZYRTEC) 10 MG tablet Take 10 mg by mouth every morning.    Yes [provider]  cloNIDine (CATAPRES) 0.1 MG tablet Take  0.1 mg by mouth 3 (three) times daily as needed (SBP >185).    Yes [provider]  diltiazem (CARDIZEM) 120 MG tablet Take 120 mg by mouth daily.   Yes [provider]  docusate sodium (COLACE) 100 MG capsule Take 100 mg by mouth 2 (two) times daily.   Yes [provider]  ENSURE (ENSURE) Take 237 mLs by mouth 3 (three) times daily.   Yes [provider]  fluticasone (VERAMYST) 27.5 MCG/SPRAY nasal spray Place 1 spray into the nose daily.   Yes [provider]  lisinopril (PRINIVIL,ZESTRIL) 20 MG tablet Take 20 mg by mouth daily.   Yes [provider]  polyethylene glycol (MIRALAX / GLYCOLAX) packet Take 17 g by mouth every other day.    Yes  [provider]  ranitidine (ZANTAC) 150 MG capsule Take 150 mg by mouth daily as needed for heartburn (gerd).   Yes [provider]  simethicone (MYLICON) 80 MG chewable tablet Chew 160 mg by mouth 2 (two) times daily.   Yes [provider]  trolamine salicylate (ASPERCREME) 10 % cream Apply 1 application topically 4 (four) times daily as needed for muscle pain.   Yes [provider]  vitamin B-12 (CYANOCOBALAMIN) 1000 MCG tablet Take 1,000 mcg by mouth daily.    Yes [provider]  zolpidem (AMBIEN) 5 MG tablet Take 1 tablet (5 mg total) by mouth at bedtime. For rest 12/13/16  Yes Estill Dooms, MD     Physical Exam: Vitals:   03/17/17 0600 03/17/17 0630 03/17/17 0700 03/17/17 0822  BP: (!) 186/111 101/73 93/65 132/72  Pulse: (!) 106 (!) 103 (!) 102 100  Resp: 18 12 11 12   Temp:    98.6 F (37 C)  TempSrc:      SpO2: 91% 100% 100% 100%  Weight:      Height:            Vitals:   03/17/17 0600 03/17/17 0630 03/17/17 0700 03/17/17 0822  BP: (!) 186/111 101/73 93/65 132/72  Pulse: (!) 106 (!) 103 (!) 102 100  Resp: 18 12 11 12   Temp:    98.6 F (37 C)  TempSrc:      SpO2: 91% 100% 100% 100%  Weight:      Height:       Constitutional: NAD, calm, comfortable Eyes: PERRL, lids and conjunctivae normal ENMT: Mucous membranes are moist. Posterior pharynx clear of any exudate or lesions.Normal dentition.  Neck: normal, supple, no masses, no thyromegaly Respiratory: clear to auscultation bilaterally, no wheezing, no crackles. Normal respiratory effort. No accessory muscle use.  Cardiovascular: Regular rate and rhythm, no murmurs / rubs / gallops. No extremity edema. 2+ pedal pulses. No carotid bruits.  Abdomen: no tenderness, no masses palpated. No hepatosplenomegaly. Bowel sounds positive.  Musculoskeletal: no clubbing / cyanosis. No joint deformity upper and lower extremities. Good ROM, no contractures. Normal muscle tone.  Skin: no  rashes, lesions, ulcers. No induration Neurologic: CN 2-12 grossly intact. Sensation intact, DTR normal. Strength 5/5 in all 4.  Psychiatric: Normal judgment and insight. Alert and oriented x 3. Normal mood.     Labs on Admission: I have personally reviewed following labs and imaging studies  CBC:  Recent Labs Lab 03/17/17 0528  WBC 10.5  NEUTROABS 8.0*  HGB 13.3  HCT 40.0  MCV 101.8*  PLT 263    Basic Metabolic Panel:  Recent Labs Lab 03/17/17 0528  NA 138  K 3.6  CL  100*  CO2 27  GLUCOSE 114*  BUN 14  CREATININE 0.59  CALCIUM 10.0    GFR: Estimated Creatinine Clearance: 25.1 mL/min (by C-G formula based on SCr of 0.59 mg/dL).  Liver Function Tests: No results for input(s): AST, ALT, ALKPHOS, BILITOT, PROT, ALBUMIN in the last 168 hours. No results for input(s): LIPASE, AMYLASE in the last 168 hours. No results for input(s): AMMONIA in the last 168 hours.  Coagulation Profile: No results for input(s): INR, PROTIME in the last 168 hours. No results for input(s): DDIMER in the last 72 hours.  Cardiac Enzymes: No results for input(s): CKTOTAL, CKMB, CKMBINDEX, TROPONINI in the last 168 hours.  BNP (last 3 results) No results for input(s): PROBNP in the last 8760 hours.  HbA1C: No results for input(s): HGBA1C in the last 72 hours. No results found for: HGBA1C   CBG: No results for input(s): GLUCAP in the last 168 hours.  Lipid Profile: No results for input(s): CHOL, HDL, LDLCALC, TRIG, CHOLHDL, LDLDIRECT in the last 72 hours.  Thyroid Function Tests: No results for input(s): TSH, T4TOTAL, FREET4, T3FREE, THYROIDAB in the last 72 hours.  Anemia Panel: No results for input(s): VITAMINB12, FOLATE, FERRITIN, TIBC, IRON, RETICCTPCT in the last 72 hours.  Urine analysis:    Component Value Date/Time   COLORURINE YELLOW 08/06/2014 1904   APPEARANCEUR CLEAR 08/06/2014 1904   LABSPEC 1.016 08/06/2014 1904   PHURINE 5.5 08/06/2014 1904   GLUCOSEU  NEGATIVE 08/06/2014 1904   HGBUR NEGATIVE 08/06/2014 1904   BILIRUBINUR NEGATIVE 08/06/2014 [place] NEGATIVE 08/06/2014 1904   PROTEINUR 30 (A) 08/06/2014 1904   UROBILINOGEN 0.2 08/06/2014 1904   NITRITE NEGATIVE 08/06/2014 1904   LEUKOCYTESUR SMALL (A) 08/06/2014 1904    Sepsis Labs: @LABRCNTIP (procalcitonin:4,lacticidven:4) )No results found for this or any previous visit (from the past 240 hour(s)).       Radiological Exams on Admission: Dg Pelvis 1-2 Views  Result Date: 03/17/2017 CLINICAL DATA:  Right femoral deformity after fall EXAM: RIGHT FEMUR 2 VIEWS; PELVIS - 1-2 VIEW COMPARISON:  None. FINDINGS: There are bilateral hip arthroplasties and right total knee arthroplasty. There is a oblique fracture of the distal right femur with medial and posterior displacement. The right total knee arthroplasty remains approximated. There is no pelvic fracture or diastasis. There is marked left convex lumbar scoliosis. Extensive vascular calcification. Findings of prior hernia repair. IMPRESSION: Oblique fracture of the distal right femur with posterior and medial displacement. The right total hip and knee arthroplasties remain approximated. Electronically Signed   By: Ulyses Jarred M.D.   On: 03/17/2017 05:31   Dg Femur, Min 2 Views Right  Result Date: 03/17/2017 CLINICAL DATA:  Right femoral deformity after fall EXAM: RIGHT FEMUR 2 VIEWS; PELVIS - 1-2 VIEW COMPARISON:  None. FINDINGS: There are bilateral hip arthroplasties and right total knee arthroplasty. There is a oblique fracture of the distal right femur with medial and posterior displacement. The right total knee arthroplasty remains approximated. There is no pelvic fracture or diastasis. There is marked left convex lumbar scoliosis. Extensive vascular calcification. Findings of prior hernia repair. IMPRESSION: Oblique fracture of the distal right femur with posterior and medial displacement. The right total hip and knee  arthroplasties remain approximated. Electronically Signed   By: Ulyses Jarred M.D.   On: 03/17/2017 05:31   Dg Pelvis 1-2 Views  Result Date: 03/17/2017 CLINICAL DATA:  Right femoral deformity after fall EXAM: RIGHT FEMUR 2 VIEWS; PELVIS - 1-2 VIEW COMPARISON:  None.  FINDINGS: There are bilateral hip arthroplasties and right total knee arthroplasty. There is a oblique fracture of the distal right femur with medial and posterior displacement. The right total knee arthroplasty remains approximated. There is no pelvic fracture or diastasis. There is marked left convex lumbar scoliosis. Extensive vascular calcification. Findings of prior hernia repair. IMPRESSION: Oblique fracture of the distal right femur with posterior and medial displacement. The right total hip and knee arthroplasties remain approximated. Electronically Signed   By: Ulyses Jarred M.D.   On: 03/17/2017 05:31   Dg Femur, Min 2 Views Right  Result Date: 03/17/2017 CLINICAL DATA:  Right femoral deformity after fall EXAM: RIGHT FEMUR 2 VIEWS; PELVIS - 1-2 VIEW COMPARISON:  None. FINDINGS: There are bilateral hip arthroplasties and right total knee arthroplasty. There is a oblique fracture of the distal right femur with medial and posterior displacement. The right total knee arthroplasty remains approximated. There is no pelvic fracture or diastasis. There is marked left convex lumbar scoliosis. Extensive vascular calcification. Findings of prior hernia repair. IMPRESSION: Oblique fracture of the distal right femur with posterior and medial displacement. The right total hip and knee arthroplasties remain approximated. Electronically Signed   By: Ulyses Jarred M.D.   On: 03/17/2017 05:31      EKG: Independently reviewed. Not done   Assessment/Plan Principal Problem:  Right Femur fracture [facility]) Patient admitted to [place] floor POA desires comfort care measures Patient started on a fentanyl drip Palliative care consulted POA desires  patient be transferred back to [facility] on Monday     Essential hypertension-continue lisinopril, Cardizem    Dementia- at baseline    GERD (gastroesophageal reflux disease)-continues by medical    B12 deficiency -continue vitamin B-12     DVT prophylaxis: *SCDs     Code Status Orders DO NOT RESUSCITATE         Start     Ordered     consults called: Palliative care  Family Communication: Admission, patients condition and plan of care including tests being ordered have been discussed with the patient  who indicates understanding and agree with the plan and Code Status  Admission status: inpatient    Disposition plan: Further plan will depend as patient's clinical course evolves and further radiologic and laboratory data become available. Likely home when stable   At the time of admission, it appears that the appropriate admission status for this patient is INPATIENT .Thisis judged to be reasonable and necessary in order to provide the required intensity of service to ensure the patient's safetygiven thepresenting symptoms, physical exam findings, and initial radiographic and laboratory data in the context of their chronic comorbidities.   Reyne Dumas MD [facility] Pager [phone]  If 7PM-7AM, please contact night-coverage [email or web address] Password [facility]  03/17/2017, 9:35 AM

## 2017-03-17 NOTE — ED Notes (Signed)
Patient transported to X-ray 

## 2017-03-17 NOTE — ED Notes (Signed)
[name]-[phone] cell caretaker/POA.

## 2017-03-18 DIAGNOSIS — F064 Anxiety disorder due to known physiological condition: Secondary | ICD-10-CM

## 2017-03-18 DIAGNOSIS — W19XXXA Unspecified fall, initial encounter: Secondary | ICD-10-CM

## 2017-03-18 DIAGNOSIS — N390 Urinary tract infection, site not specified: Secondary | ICD-10-CM

## 2017-03-18 DIAGNOSIS — S72001A Fracture of unspecified part of neck of right femur, initial encounter for closed fracture: Secondary | ICD-10-CM

## 2017-03-18 DIAGNOSIS — G301 Alzheimer's disease with late onset: Secondary | ICD-10-CM

## 2017-03-18 DIAGNOSIS — S72331A Displaced oblique fracture of shaft of right femur, initial encounter for closed fracture: Secondary | ICD-10-CM

## 2017-03-18 DIAGNOSIS — I10 Essential (primary) hypertension: Secondary | ICD-10-CM

## 2017-03-18 DIAGNOSIS — F0281 Dementia in other diseases classified elsewhere with behavioral disturbance: Secondary | ICD-10-CM

## 2017-03-18 DIAGNOSIS — E538 Deficiency of other specified B group vitamins: Secondary | ICD-10-CM

## 2017-03-18 DIAGNOSIS — Y92129 Unspecified place in nursing home as the place of occurrence of the external cause: Secondary | ICD-10-CM

## 2017-03-18 DIAGNOSIS — K210 Gastro-esophageal reflux disease with esophagitis: Secondary | ICD-10-CM

## 2017-03-18 MED ORDER — FAMOTIDINE 20 MG PO TABS
20.0000 mg | ORAL_TABLET | Freq: Two times a day (BID) | ORAL | Status: DC
  Administered 2017-03-18 – 2017-03-19 (×3): 20 mg via ORAL
  Filled 2017-03-18 (×3): qty 1

## 2017-03-18 MED ORDER — CEPHALEXIN 250 MG PO CAPS
250.0000 mg | ORAL_CAPSULE | Freq: Two times a day (BID) | ORAL | Status: DC
  Administered 2017-03-18 – 2017-03-19 (×3): 250 mg via ORAL
  Filled 2017-03-18 (×4): qty 1

## 2017-03-18 NOTE — Progress Notes (Signed)
PROGRESS NOTE    [name]  ZSW:[id number] DOB: September 19, 1916 DOA: 03/17/2017 PCP: Gayland Curry, DO   Brief Narrative:  The patient is a 83 year old female with a history of mild dementia, essential hypertension, macular degeneration, hearing loss, who presents to the ER from [place] home after a fall. Patient was found sitting on the floor at 3 AM. Patient right leg seemed to be rotated externally. Patient unable to provide any history, because of underlying dementia. Patient was brought in via EMS. Patient found to be moaning and groaning in pain. She was admitted for a Right Femur Fracture and after the Admitting Physician discussed with the HCPOA, the patient was made Comfort Measures and plan is to transfer back to [facility] in AM with Hospice.   Assessment & Plan:   Principal Problem:   Femur fracture ([place]) Active Problems:   Essential hypertension   Dementia   GERD (gastroesophageal reflux disease)   B12 deficiency   Macular degeneration   Anxiety disorder   Depression   Hip fracture ([facility])  Right Femur fracture ([facility]) -Patient admitted to MedSurg floor -Right Femur X-Ray shows Oblique fracture of the distal right femur with posterior and medial displacement. The right total hip and knee arthroplasties remain approximated. -POA desires Comfort Care Measures -Palliative Care Medicine Consulted and awaiting call back from HCPOA to fill out papers for Hospice tomorrow -Patient was started on a Fentanyl gtt yesterday but changed to po Oxycodone 5 mg q3hprn for Moderate/Severe Pain and Fentanyl 12.5-25 mcg q1hprn for Break Through Pain -C/w 0.5-1 mg of IV Lorazepam q4hprn Anxiety -C/w Zofran 4 mg po/IV q6hprn Nausea  -POA desires patient be transferred back to [facility] on Monday; Palliative Calling to confirm HCPOA and discuss case with her; She aggrees with no aggressive measures and no Labs or Cultures to be drawn but states ok to empirically treat UTI with po  Abx   Suspected UTI -Patient had Purulent Urine noted  -Will not doe extensive workup and send Urinalysis / Urine Cx due to patient being Comfort -However will treat empirically for Comfort for 5 days with po Cephalexin 500 mg po q12h -HCPOA ok with Empiric Abx  Essential Hypertension -C/w Clonidine 0.1 mg po TID prn SBP > 185 -C/w Diltiazem 120 mg po Daily and Lisiniopril 20 mg  -BP at goal currently and was 119/67  Dementia -At Baseline -Patient would not respond to my questioning this AM and Aide stated she has periods where she will do that at the SNF  GERD (Gastroesophageal Reflux Disease) -No Active Issues -C/w Famotidine for Zantac Auto Substitution   B12 Deficiency   -Continue vitamin B-12 1,000 mcg po Daily  DVT prophylaxis: SCDs; Patient is Comfort Care; Confirmed by HCPOA yesterday Code Status: DO NOT RESUSCITATE Family Communication: No family present at bedside; Aide at bedside Disposition Plan: Likely Hospice at [facility] in AM pending discussion with HCPOA  Consultants:   Palliative Care Medicine   Procedures: None   Antimicrobials:  Anti-infectives    None     Subjective: Seen and examined this AM and would not respond to questioning or participate in exam due to dementia and difficult hearing. Would only stated. Nurse Aide at bedside. Appeared comfortable.   Objective: Vitals:   03/17/17 1300 03/17/17 2202 03/17/17 2325 03/18/17 0538  BP: 134/82 139/86 (!) 161/99 119/67  Pulse: (!) 108 (!) 108 (!) 102 96  Resp: 12 14  15   Temp: 98.4 F (36.9 C) 98.7 F (37.1 C)  98.7 F (37.1 C)  TempSrc:  Oral  Oral  SpO2: 96% 98%  97%  Weight:      Height:        Intake/Output Summary (Last 24 hours) at 03/18/17 1026 Last data filed at 03/18/17 0827  Gross per 24 hour  Intake              410 ml  Output              100 ml  Net              310 ml   Filed Weights   03/17/17 0449 03/17/17 1100  Weight: 39.5 kg (87 lb) 38.6 kg (85 lb)    Examination: Physical Exam:  Constitutional: Thin cachectic Caucasian female in NAD and appears calm and comfortable Eyes: Lids and conjunctivae normal, sclerae anicteric  ENMT: External Ears, Nose appear normal. Patient has difficulty hearing.  Neck: Appears normal, supple, no cervical masses, normal ROM, no appreciable thyromegaly, no JVD Respiratory: Clear to auscultation bilaterally, no wheezing, rales, rhonchi or crackles. Normal respiratory effort and patient is not tachypenic. No accessory muscle use.  Cardiovascular: RRR, no murmurs / rubs / gallops. S1 and S2 auscultated. No extremity edema.  Abdomen: Soft, non-tender, non-distended. No masses palpated. No appreciable hepatosplenomegaly. Bowel sounds positive.  GU: Deferred. Musculoskeletal: No clubbing / cyanosis of digits/nails. Right Extremity rotated due to Fracture.  Skin: No rashes, lesions, ulcers appreciated on limited exam. Has senile purpura. No induration; Warm and dry.  Neurologic: Would track you with her eyes and respond to pain but would not follow commands. Romberg sign cerebellar reflexes not assessed.  Psychiatric: Unable to assess due to current condition. Awake but not alert or oriented x 3.  Data Reviewed: I have personally reviewed following labs and imaging studies  CBC:  Recent Labs Lab 03/17/17 0528  WBC 10.5  NEUTROABS 8.0*  HGB 13.3  HCT 40.0  MCV 101.8*  PLT 798   Basic Metabolic Panel:  Recent Labs Lab 03/17/17 0528  NA 138  K 3.6  CL 100*  CO2 27  GLUCOSE 114*  BUN 14  CREATININE 0.59  CALCIUM 10.0   GFR: Estimated Creatinine Clearance: 24.5 mL/min (by C-G formula based on SCr of 0.59 mg/dL). Liver Function Tests: No results for input(s): AST, ALT, ALKPHOS, BILITOT, PROT, ALBUMIN in the last 168 hours. No results for input(s): LIPASE, AMYLASE in the last 168 hours. No results for input(s): AMMONIA in the last 168 hours. Coagulation Profile: No results for input(s): INR,  PROTIME in the last 168 hours. Cardiac Enzymes: No results for input(s): CKTOTAL, CKMB, CKMBINDEX, TROPONINI in the last 168 hours. BNP (last 3 results) No results for input(s): PROBNP in the last 8760 hours. HbA1C: No results for input(s): HGBA1C in the last 72 hours. CBG: No results for input(s): GLUCAP in the last 168 hours. Lipid Profile: No results for input(s): CHOL, HDL, LDLCALC, TRIG, CHOLHDL, LDLDIRECT in the last 72 hours. Thyroid Function Tests: No results for input(s): TSH, T4TOTAL, FREET4, T3FREE, THYROIDAB in the last 72 hours. Anemia Panel: No results for input(s): VITAMINB12, FOLATE, FERRITIN, TIBC, IRON, RETICCTPCT in the last 72 hours. Sepsis Labs: No results for input(s): PROCALCITON, LATICACIDVEN in the last 168 hours.  No results found for this or any previous visit (from the past 240 hour(s)).   Radiology Studies: Dg Pelvis 1-2 Views  Result Date: 03/17/2017 CLINICAL DATA:  Right femoral deformity after fall EXAM: RIGHT FEMUR 2 VIEWS; PELVIS -  1-2 VIEW COMPARISON:  None. FINDINGS: There are bilateral hip arthroplasties and right total knee arthroplasty. There is a oblique fracture of the distal right femur with medial and posterior displacement. The right total knee arthroplasty remains approximated. There is no pelvic fracture or diastasis. There is marked left convex lumbar scoliosis. Extensive vascular calcification. Findings of prior hernia repair. IMPRESSION: Oblique fracture of the distal right femur with posterior and medial displacement. The right total hip and knee arthroplasties remain approximated. Electronically Signed   By: Ulyses Jarred M.D.   On: 03/17/2017 05:31   Dg Femur, Min 2 Views Right  Result Date: 03/17/2017 CLINICAL DATA:  Right femoral deformity after fall EXAM: RIGHT FEMUR 2 VIEWS; PELVIS - 1-2 VIEW COMPARISON:  None. FINDINGS: There are bilateral hip arthroplasties and right total knee arthroplasty. There is a oblique fracture of the distal  right femur with medial and posterior displacement. The right total knee arthroplasty remains approximated. There is no pelvic fracture or diastasis. There is marked left convex lumbar scoliosis. Extensive vascular calcification. Findings of prior hernia repair. IMPRESSION: Oblique fracture of the distal right femur with posterior and medial displacement. The right total hip and knee arthroplasties remain approximated. Electronically Signed   By: Ulyses Jarred M.D.   On: 03/17/2017 05:31   Scheduled Meds: . diltiazem  120 mg Oral Daily  . docusate sodium  100 mg Oral BID  . fluticasone  1 spray Each Nare Daily  . lisinopril  20 mg Oral Daily  . loratadine  10 mg Oral Daily  . polyethylene glycol  17 g Oral QODAY  . simethicone  160 mg Oral BID  . vitamin B-12  1,000 mcg Oral Daily  . zolpidem  5 mg Oral QHS   Continuous Infusions:   LOS: 1 day   Kerney Elbe, DO [facility] Pager [phone]  If 7PM-7AM, please contact night-coverage [email or web address] Password [facility] 03/18/2017, 10:26 AM

## 2017-03-18 NOTE — NC FL2 (Signed)
[place] LEVEL OF CARE SCREENING TOOL     IDENTIFICATION  Patient Name: [name] Birthdate: Jun 08, 1917 Sex: female Admission Date (Current Location): 03/17/2017  [facility] and [place] Number:  Herbalist and Address:  [facility],  [address] [place] [place], [place]      Provider Number: [id number]  Attending Physician Name and Address:  Kerney Elbe, DO  Relative Name and Phone Number:       Current Level of Care: Hospital Recommended Level of Care: [place] Prior Approval Number:    Date Approved/Denied:   PASRR Number: [id number]  Discharge Plan: SNF    Current Diagnoses: Patient Active Problem List   Diagnosis Date Noted  . Femur fracture ([place]) 03/17/2017  . Hip fracture ([place]) 03/17/2017  . HOH (hard of hearing) 10/23/2016  . Foot drop 10/23/2016  . Entropion 08/24/2016  . Depression 09/16/2015  . Allergic rhinitis 09/16/2015  . Incontinence in female 08/12/2015  . Anxiety disorder 07/15/2015  . Constipation 06/07/2015  . Insomnia 01/07/2015  . Loss of weight 12/10/2014  . Osteoarthritis, shoulder 12/10/2014  . Bloating 10/06/2014  . Lumbar back pain 09/08/2014  . Protein-calorie malnutrition, severe ([place]) 08/11/2014  . Loculated pleural effusion on Right   . Lung nodule 08/06/2014  . DJD (degenerative joint disease)   . Debility 07/07/2013  . GI bleed 06/29/2013  . Essential hypertension 06/29/2013  . Dementia 06/29/2013  . GERD (gastroesophageal reflux disease) 06/29/2013  . B12 deficiency 06/29/2013  . Macular degeneration 06/29/2013    Orientation RESPIRATION BLADDER Height & Weight     Self  Normal Incontinent, Indwelling catheter Weight: 85 lb (38.6 kg) Height:  5' (152.4 cm)  BEHAVIORAL SYMPTOMS/MOOD NEUROLOGICAL BOWEL NUTRITION STATUS      Incontinent    AMBULATORY STATUS COMMUNICATION OF NEEDS Skin   Extensive Assist Verbally Normal                        Personal Care Assistance Level of Assistance  Bathing, Dressing, Feeding Bathing Assistance: Maximum assistance Feeding assistance: Limited assistance Dressing Assistance: Maximum assistance     Functional Limitations Info  Hearing   Hearing Info: Impaired (hearing aids)      SPECIAL CARE FACTORS FREQUENCY                       Contractures      Additional Factors Info  Code Status, Allergies Code Status Info: DNR Allergies Info: Celebrex Celecoxib, Morphine And Related, Vicodin Hydrocodone-acetaminophen, Sulfa Antibiotics           Current Medications (03/18/2017):  This is the current hospital active medication list Current Facility-Administered Medications  Medication Dose Route Frequency Provider Last Rate Last Dose  . acetaminophen (TYLENOL) tablet 650 mg  650 mg Oral Q6H PRN Reyne Dumas, MD       Or  . acetaminophen (TYLENOL) suppository 650 mg  650 mg Rectal Q6H PRN Reyne Dumas, MD      . cloNIDine (CATAPRES) tablet 0.1 mg  0.1 mg Oral TID PRN Reyne Dumas, MD      . diltiazem (CARDIZEM CD) 24 hr capsule 120 mg  120 mg Oral Daily Abrol, Ascencion Dike, MD   120 mg at 03/18/17 0500  . docusate sodium (COLACE) capsule 100 mg  100 mg Oral BID Reyne Dumas, MD   100 mg at 03/18/17 1011  . fentaNYL (SUBLIMAZE) injection 12.5-25 mcg  12.5-25 mcg Intravenous Q1H  PRN Micheline Rough, MD      . fluticasone Asencion Islam) 50 MCG/ACT nasal spray 1 spray  1 spray Each Nare Daily Reyne Dumas, MD   1 spray at 03/17/17 2244  . levalbuterol (XOPENEX) nebulizer solution 0.63 mg  0.63 mg Nebulization Q6H PRN Reyne Dumas, MD      . lisinopril (PRINIVIL,ZESTRIL) tablet 20 mg  20 mg Oral Daily Reyne Dumas, MD   20 mg at 03/18/17 1011  . loratadine (CLARITIN) tablet 10 mg  10 mg Oral Daily Reyne Dumas, MD   10 mg at 03/18/17 1011  . LORazepam (ATIVAN) injection 0.5-1 mg  0.5-1 mg Intravenous Q4H PRN Opyd, Ilene Qua, MD      . ondansetron (ZOFRAN) tablet 4 mg  4 mg Oral Q6H PRN  Reyne Dumas, MD       Or  . ondansetron (ZOFRAN) injection 4 mg  4 mg Intravenous Q6H PRN Abrol, Ascencion Dike, MD      . oxyCODONE (Oxy IR/ROXICODONE) immediate release tablet 5 mg  5 mg Oral Q3H PRN Micheline Rough, MD   5 mg at 03/18/17 0554  . polyethylene glycol (MIRALAX / GLYCOLAX) packet 17 g  17 g Oral QODAY Reyne Dumas, MD   17 g at 03/18/17 1011  . simethicone (MYLICON) chewable tablet 160 mg  160 mg Oral BID Reyne Dumas, MD   160 mg at 03/18/17 1012  . vitamin B-12 (CYANOCOBALAMIN) tablet 1,000 mcg  1,000 mcg Oral Daily Reyne Dumas, MD   1,000 mcg at 03/18/17 1012  . zolpidem (AMBIEN) tablet 5 mg  5 mg Oral QHS Reyne Dumas, MD         Discharge Medications: Please see discharge summary for a list of discharge medications.  Relevant Imaging Results:  Relevant Lab Results:   Additional Information SS#: [id number]  Geralynn Ochs, LCSW

## 2017-03-18 NOTE — Progress Notes (Signed)
PHARMACY NOTE:  ANTIMICROBIAL RENAL DOSAGE ADJUSTMENT  Current antimicrobial regimen includes a mismatch between antimicrobial dosage and estimated renal function.  As per policy approved by the Pharmacy & Therapeutics and Medical Executive Committees, the antimicrobial dosage will be adjusted accordingly.  Current antimicrobial dosage:  Keflex 500mg  q12h  Indication: UTI  Renal Function:  Estimated Creatinine Clearance: 24.5 mL/min (by C-G formula based on SCr of 0.59 mg/dL). []      On intermittent HD, scheduled: []      On CRRT    Antimicrobial dosage has been changed to:  250mg  q12h  Additional comments:   Thank you for allowing pharmacy to be a part of this patient's care.  Peggyann Juba, PharmD, BCPS Pager: [phone] 03/18/2017 11:05 AM

## 2017-03-18 NOTE — Care Management Note (Signed)
Case Management Note  Patient Details  Name: [name] MRN: [id number] Date of Birth: [birth year]  Subjective/Objective:    Hip fracture from fall, comfort care                Action/Plan: Discharge Planning: Chart reviewed. CSW following for dc back to [facility].   PCP REED, TIFFANY L  Expected Discharge Date:                 Expected Discharge Plan:  Skilled Nursing Facility  In-House Referral:  Clinical Social Work  Discharge planning Services  CM Consult  Post Acute Care Choice:  NA Choice offered to:  NA  DME Arranged:  N/A DME Agency:  NA  HH Arranged:  NA HH Agency:  NA  Status of Service:  Completed, signed off  If discussed at [place] of Stay Meetings, dates discussed:    Additional Comments:  Erenest Rasher, RN 03/18/2017, 12:35 PM

## 2017-03-18 NOTE — Progress Notes (Signed)
Daily Progress Note   Patient Name: [name]       Date: 03/18/2017 DOB: August 07, 1917  Age: 83 y.o. MRN#: [id number] Attending Physician: Kerney Elbe, DO Primary Care Physician: Gayland Curry, DO Admit Date: 03/17/2017  Reason for Consultation/Follow-up: Establishing goals of care, Hospice Evaluation and Pain control  Subjective: I saw and examined Ms. Lucien this AM.  She is awake but largely non-verbal.  Noted purulence to urine this AM.  Private duty sitter, Jackelyn Poling, is at bedside today.  She reports Ms. Ebrahim sleeps most of the day at facility.  She thinks that she is a little less verbal today, but is not acting abnormal from baseline otherwise.  I received call back from Luetta Nutting this AM.  She also reports that she is unsure who is Ms. Ritthaler's HCPOA, but she received a letter several years ago stating that someone else had been named and her services were no longer needed.  We discussed plan for back to [facility] with hospice, and she agrees that this would be Ms. Loughry's desire.  I was then able to connect with Baron Hamper.  She reports that she is now HCPOA.  We discussed concern for UTI, plan for comfort care, and goal of back to [facility] with hospice support.  Length of Stay: 1  Current Medications: Scheduled Meds:  . diltiazem  120 mg Oral Daily  . docusate sodium  100 mg Oral BID  . fluticasone  1 spray Each Nare Daily  . lisinopril  20 mg Oral Daily  . loratadine  10 mg Oral Daily  . polyethylene glycol  17 g Oral QODAY  . simethicone  160 mg Oral BID  . vitamin B-12  1,000 mcg Oral Daily  . zolpidem  5 mg Oral QHS    Continuous Infusions:   PRN Meds: acetaminophen **OR** acetaminophen, cloNIDine, fentaNYL (SUBLIMAZE) injection,  levalbuterol, LORazepam, ondansetron **OR** ondansetron (ZOFRAN) IV, oxyCODONE  Physical Exam         General: Awake, but does not engage in conversation.  RN and private sitter report that she occasionally will speak.  Sitter reports at [employer] she is generally sleeping most of the day. HEENT: No bruits, no goiter, no JVD Heart: Regular rate and rhythm. No murmur appreciated. Lungs: Good air movement, clear Abdomen: Soft, nontender,  nondistended, positive bowel sounds.  Skin: Warm and dry  Vital Signs: BP 119/67 (BP Location: Left Arm)   Pulse 96   Temp 98.7 F (37.1 C) (Oral)   Resp 15   Ht 5' (1.524 m)   Wt 38.6 kg (85 lb)   SpO2 97%   BMI 16.60 kg/m  SpO2: SpO2: 97 % O2 Device: O2 Device: Not Delivered O2 Flow Rate:    Intake/output summary:  Intake/Output Summary (Last 24 hours) at 03/18/17 1040 Last data filed at 03/18/17 1000  Gross per 24 hour  Intake              530 ml  Output              100 ml  Net              430 ml   LBM:   Baseline Weight: Weight: 39.5 kg (87 lb) Most recent weight: Weight: 38.6 kg (85 lb)       Palliative Assessment/Data:      Patient Active Problem List   Diagnosis Date Noted  . Femur fracture ([place]) 03/17/2017  . Hip fracture ([place]) 03/17/2017  . HOH (hard of hearing) 10/23/2016  . Foot drop 10/23/2016  . Entropion 08/24/2016  . Depression 09/16/2015  . Allergic rhinitis 09/16/2015  . Incontinence in female 08/12/2015  . Anxiety disorder 07/15/2015  . Constipation 06/07/2015  . Insomnia 01/07/2015  . Loss of weight 12/10/2014  . Osteoarthritis, shoulder 12/10/2014  . Bloating 10/06/2014  . Lumbar back pain 09/08/2014  . Protein-calorie malnutrition, severe ([place]) 08/11/2014  . Loculated pleural effusion on Right   . Lung nodule 08/06/2014  . DJD (degenerative joint disease)   . Debility 07/07/2013  . GI bleed 06/29/2013  . Essential hypertension 06/29/2013  . Dementia 06/29/2013  . GERD (gastroesophageal reflux  disease) 06/29/2013  . B12 deficiency 06/29/2013  . Macular degeneration 06/29/2013    Palliative Care Assessment & Plan   Patient Profile:  83 y.o. female  with past medical history of dementia, hypertension, macular degeneration, hearing loss, resident of long-term care facility admitted on 03/17/2017 with hip fracture related to fall.  Her healthcare power of attorney was present at the hospital and elected for comfort care. It is noted that her [facility] POA desires to have her transition back to [facility] when she is discharged. Palliative consulted for goals of care and symptom assessment.   Recommendations/Plan:  Pain- appears well controlled on current regimen.  Plan to continue same.  Most of her pain now seems to be related to changes.  Discussed plan to pretreat 30 minutes prior to preplanned moves.  Purulent urine- suspect UTI.  While she is comfort care, I generally recommend treating UTI as this can be very uncomfortable.  Discussed with Dr. Alfredia Ferguson and recommend empiric treatment with oral antibiotic for 5 days.  I would not pursue further labs or culture.  She can discharge back to [facility] with hospice and complete oral antibiotics there.  I received call back from Baron Hamper who reports she is HCPOA.  She is in agreement with the above plan and transition back to [facility] with hospice as soon as tomorrow.  Goals of Care and Additional Recommendations:  Limitations on Scope of Treatment: Full Comfort Care  Code Status:    Code Status Orders        Start     Ordered   03/17/17 0851  Do not attempt resuscitation (DNR)  Continuous    Question  Answer Comment  In the event of cardiac or respiratory ARREST Do not call a "code blue"   In the event of cardiac or respiratory ARREST Do not perform Intubation, CPR, defibrillation or ACLS   In the event of cardiac or respiratory ARREST Use medication by any route, position, wound care, and other measures to relive pain and  suffering. May use oxygen, suction and manual treatment of airway obstruction as needed for comfort.      03/17/17 0850    Code Status History    Date Active Date Inactive Code Status Order ID Comments User Context   03/17/2017  7:32 AM 03/17/2017  8:50 AM Full Code [id number]  Reyne Dumas, MD ED   03/17/2017  4:46 AM 03/17/2017  7:32 AM DNR [id number]  Shanon Rosser, MD ED   08/10/2015  2:20 PM 03/17/2017  4:33 AM DNR [id number]  Ripley Fraise, [facility]   08/06/2014  9:57 PM 08/12/2014  4:57 PM DNR [id number]  Ivor Costa, MD Inpatient   10/08/2013  5:08 PM 10/11/2013  2:55 PM DNR [id number]  Caren Griffins, MD ED   06/30/2013  5:44 AM 07/04/2013  3:07 PM DNR [id number]  Dianne Dun, NP Inpatient   06/29/2013  8:14 PM 06/30/2013  5:44 AM Full Code [id number]  Aldean Jewett, MD Inpatient   06/29/2013  7:22 PM 06/29/2013  8:14 PM DNR [id number]  Aldean Jewett, MD ED    Advance Directive Documentation     Most Recent Value  Type of Advance Directive  Healthcare Power of [place], Out of facility DNR (pink MOST or yellow form)  Pre-existing out of facility DNR order (yellow form or pink MOST form)  Yellow form placed in chart (order not valid for inpatient use)  "MOST" Form in Place?  -       Prognosis:   < 6 months  Discharge Planning:  [facility] with Hospice services  Care plan was discussed with Baron Hamper, Luetta Nutting, Dr. Alfredia Ferguson,   Thank you for allowing the Palliative Medicine Team to assist in the care of this patient.   Time In: 0950 Time Out: 1100 Total Time 70 Prolonged Time Billed Yes      Greater than 50%  of this time was spent counseling and coordinating care related to the above assessment and plan.  Micheline Rough, MD  Please contact Palliative Medicine Team phone at [phone] for questions and concerns.

## 2017-03-18 NOTE — Clinical Social Work Note (Signed)
Clinical Social Work Assessment  Patient Details  Name: [name] MRN: [id number] Date of Birth: [birth year]  Date of referral:  03/18/17               Reason for consult:  Facility Placement, Discharge Planning                Permission sought to share information with:  Facility Sport and exercise psychologist, Family Supports Permission granted to share information::  Yes, Verbal Permission Granted  Name::     Smitty Knudsen  Agency::  SNF  Relationship::  HCPOA, Copywriter, advertising Information:     Housing/Transportation Living arrangements for the past 2 months:  [place] of Information:  Patient, Power of Attorney, Palliative Care Team, Other (Comment Required) (Caregiver) Patient Interpreter Needed:  None Criminal Activity/Legal Involvement Pertinent to Current Situation/Hospitalization:  No - Comment as needed Significant Relationships:  Warehouse manager Lives with:  Self, Facility Resident Do you feel safe going back to the place where you live?  Yes Need for family participation in patient care:  Yes (Comment) (patient has HCPOA)  Care giving concerns:  Patient has been living at [facility] and caregivers report no concerns about care received there.   Social Worker assessment / plan:  CSW met with patient and patient's caregiver, Jackelyn Poling, at bedside. CSW confirmed plan to return to [facility] SN[facility] at discharge with hospice services in place. CSW contacted by Palliative MD, Dr. Domingo Cocking, who confirmed contact with Baron Hamper as HCPOA who agreed with the discharge plan. CSW completed FL2 and sent to [facility] to facilitate discharge planning. CSW will continue to follow to assist with returning the patient to [facility] SNF when medically stable.  Employment status:  Retired Forensic scientist:  Medicare PT Recommendations:  Not assessed at this time Information / Referral to community resources:     Patient/Family's Response to care:   Patient's caregivers are agreeable to return to SNF.  Patient/Family's Understanding of and Emotional Response to Diagnosis, Current Treatment, and Prognosis:  Patient's caregivers seem to understand the patient's decline and need for comfort care at this time, as well as return to SNF for patient's care. Patient's caregivers indicated understanding of CSW role in discharge planning.  Emotional Assessment Appearance:  Appears stated age Attitude/Demeanor/Rapport:  Unable to Assess Affect (typically observed):  Unable to Assess Orientation:  Oriented to Self Alcohol / Substance use:  Not Applicable Psych involvement (Current and /or in the community):  No (Comment)  Discharge Needs  Concerns to be addressed:  Care Coordination, Discharge Planning Concerns Readmission within the last 30 days:  No Current discharge risk:  Cognitively Impaired, Physical Impairment, Terminally ill Barriers to Discharge:  Continued Medical Work up   [employer], [place] 03/18/2017, 3:06 PM

## 2017-03-19 DIAGNOSIS — H353230 Exudative age-related macular degeneration, bilateral, stage unspecified: Secondary | ICD-10-CM

## 2017-03-19 MED ORDER — ONDANSETRON HCL 4 MG PO TABS: 4.0000 mg | ORAL_TABLET | Freq: Four times a day (QID) | ORAL | 0 refills | Status: AC | PRN

## 2017-03-19 MED ORDER — OXYCODONE HCL 5 MG PO TABS: 5.0000 mg | ORAL_TABLET | ORAL | 0 refills | Status: AC | PRN

## 2017-03-19 MED ORDER — LORAZEPAM 0.5 MG PO TABS: 0.5000 mg | ORAL_TABLET | Freq: Four times a day (QID) | ORAL | 0 refills | Status: AC | PRN

## 2017-03-19 MED ORDER — LEVALBUTEROL HCL 0.63 MG/3ML IN NEBU: 0.6300 mg | INHALATION_SOLUTION | Freq: Four times a day (QID) | RESPIRATORY_TRACT | 12 refills | Status: DC | PRN

## 2017-03-19 MED ORDER — OXYCODONE HCL 5 MG PO TABS: 5.0000 mg | ORAL_TABLET | Freq: Four times a day (QID) | ORAL | 0 refills | Status: AC

## 2017-03-19 MED ORDER — CEPHALEXIN 250 MG PO CAPS: 250.0000 mg | ORAL_CAPSULE | Freq: Two times a day (BID) | ORAL | 0 refills | Status: DC

## 2017-03-19 MED ORDER — HALOPERIDOL LACTATE 2 MG/ML PO CONC: 2.0000 mg | Freq: Four times a day (QID) | ORAL | 0 refills | Status: AC | PRN

## 2017-03-19 NOTE — Progress Notes (Signed)
Palliative Medicine RN Note: Rec'd call from pt's cousin Luetta Nutting [phone]. She had questions regarding whether pt had hip or leg fx and whether pt was dropped or fell. Passed message on to Dr Domingo Cocking, who can hopefully call her this am. Called [facility] so they can address fall vs drop at the facility.   Marjie Skiff Kiaira Pointer, RN, BSN, [facility] 03/19/2017 10:12 AM Cell [phone] 8:00-4:00 Monday-Friday Office [phone]

## 2017-03-19 NOTE — Progress Notes (Signed)
Nutrition Brief Note  Patient identified for underweight BMI.  Wt Readings from Last 15 Encounters:  03/17/17 85 lb (38.6 kg)  03/13/17 87 lb (39.5 kg)  02/27/17 88 lb 14.4 oz (40.3 kg)  01/16/17 88 lb (39.9 kg)  12/27/16 95 lb 1.6 oz (43.1 kg)  12/12/16 95 lb (43.1 kg)  12/11/16 95 lb 1.6 oz (43.1 kg)  11/16/16 98 lb 6.4 oz (44.6 kg)  10/23/16 95 lb (43.1 kg)  09/19/16 96 lb (43.5 kg)  08/17/16 97 lb (44 kg)  07/10/16 97 lb (44 kg)  06/08/16 99 lb (44.9 kg)  05/15/16 99 lb 9.6 oz (45.2 kg)  03/06/16 101 lb 12.8 oz (46.2 kg)    Body mass index is 16.6 kg/m. Patient meets criteria for underweight status based on current BMI. Skin WDL. Pt admitted for R femur fx 2/2 fall. She is from [employer] and d/c summary in this AM stating that pt is returning to the facility with hospice and is comfort care only.   Current diet order is Regular, patient consumed 40% of breakfast and 50% of lunch 7/22; no other intakes documented since admission. Labs and medications reviewed.   No nutrition interventions warranted at this time. If nutrition issues arise, please consult RD.      Jarome Matin, MS, RD, LDN, [facility] Inpatient Clinical Dietitian Pager # [phone] After hours/weekend pager # [phone]

## 2017-03-19 NOTE — Progress Notes (Addendum)
CSW called Butch Penny at [facility] to arrange discharge back to facility, left voicemail. CSW faxed discharge summary.  CSW talk with pt. Family at bedside, explain role. Undestand pt. Current level of treatment and plan at facility.  Waiting for admission to confirm they are ready for patient.  RN given admission packet, number for report.   PTAR scheduled for transport: 1:30pm Informed nurse secretary to inform RN.   Kathrin Greathouse, Latanya Presser, MSW Clinical Social Worker 5E and Psychiatric Service Line [phone] 03/19/2017  11:58 AM

## 2017-03-19 NOTE — Discharge Summary (Addendum)
Physician Discharge Summary  [name]:[id number] DOB: Feb 24, 1917 DOA: 03/17/2017  PCP: Gayland Curry, DO  Admit date: 03/17/2017 Discharge date: 03/19/2017  Admitted From: SNF Disposition: SNF with Hospice  Recommendations for Outpatient Follow-up:  1. Follow up care at SNF with Hospice per Protocol 2. Continue Foley Catheter at Discharge for [place]: No Equipment/Devices: None    Discharge Condition: Guarded; Comfort Care CODE STATUS: DO NOT RESUSCITATE Diet recommendation: Regular Diet  Brief/Interim Summary: The patient is a14 year old female with a history of mild dementia, essential hypertension, macular degeneration, hearing loss, who presented to the ER from [place] home after a fall. Patient was found sitting on the floor at 3 AM. Patient right leg seemed to be rotated externally and she unable to provide any history, because of underlying dementia. Patient was brought in via EMS. Patient found to be moaning and groaning in pain. She was admitted and found to have a Right Femur Fracture and after the Admitting Physician discussed with the HCPOA, the patient was made Comfort Measures and plan is to transfer back to [facility] in AM with Hospice. Palliative Care was consulted for further evaluation and Management. Of note patient was also suspected to have a UTI and was empirically started on Abx for 5 days. At this time patient will go back to SNF with Hospice and will have further care there per Hospice Protocol.   Discharge Diagnoses:  Principal Problem:   Femur fracture ([place]) Active Problems:   Essential hypertension   Dementia   GERD (gastroesophageal reflux disease)   B12 deficiency   Macular degeneration   Anxiety disorder   Depression   Hip fracture (HCC)  RightFemur fracture (HCC) 2/2 to Fall  -Patient was admitted to MedSurg floor -Right Femur X-Ray shows Oblique fracture of the distal right femur with posterior and medial  displacement. The right total hip and knee arthroplasties remain approximated. -POA desires Comfort Care Measures -Palliative Care Medicine Consulted and awaiting call back from HCPOA to fill out papers for Hospice tomorrow -Patient was started on a Fentanyl gtt yesterday but changed to po Oxycodone 5 mg q3hprn for Moderate/Severe Pain and Fentanyl 12.5-25 mcg q1hprn for Break Through Pain; Will D/C on Oxycodone 5 mg po q6h scheduled and 5 mg po q3hprn for Breakthrough pain -C/w Acetaminophen 1000 mg po TID -Changed 0.5-1 mg of IV Lorazepam q4hprn Anxiety to po 0.5 mg q6hpnr -C/w Zofran 4 mg po q6hprn Nausea  -Added Haloperdiol 1 mL q6hprn for Agitation -POA desires patient be transferred back to [facility] Today; Palliative Calling to confirm HCPOA and discuss case with her; HCPOA aggrees with no aggressive measures and no Labs or Cultures to be drawn but states ok to empirically treat UTI with po Abx -Follow up Care per Hospice Protocol at SNF  Suspected UTI -Patient had Purulent Urine noted yesterday; Appeared Clearer today  -Will not doe extensive workup and send Urinalysis / Urine Cx due to patient being Comfort -However will treat empirically for Comfort for 5 days with po Cephalexin 250 mg po q12h -HCPOA ok with Empiric Abx  Essential Hypertension -C/w Clonidine 0.1 mg po TID prn SBP > 185 -C/w Diltiazem 120 mg po Daily and Lisiniopril 20 mg  -BP at was elevated this AM at 162/77; Likely from Pain -Pain Control as above   Dementia -At Baseline -Was more interactive this AM and stated she was in pain and was thirsty   GERD (Gastroesophageal Reflux Disease) -No Active Issues -C/w Zantac  at [facility]   B12 Deficiency   -Continue vitamin B-12 1,000 mcg po Daily  Discharge Instructions Discharge Instructions    Call MD for:  difficulty breathing, headache or visual disturbances    Complete by:  As directed    Call MD for:  extreme fatigue    Complete by:  As directed    Call  MD for:  hives    Complete by:  As directed    Call MD for:  persistant dizziness or light-headedness    Complete by:  As directed    Call MD for:  persistant nausea and vomiting    Complete by:  As directed    Call MD for:  redness, tenderness, or signs of infection (pain, swelling, redness, odor or green/yellow discharge around incision site)    Complete by:  As directed    Call MD for:  severe uncontrolled pain    Complete by:  As directed    Call MD for:  temperature >100.4    Complete by:  As directed    Diet general    Complete by:  As directed    Discharge instructions    Complete by:  As directed    Follow up Care at SNF per Hospice Protocol   Increase activity slowly    Complete by:  As directed      Allergies as of 03/19/2017      Reactions   Celebrex [celecoxib] Itching   Morphine And Related Nausea Only   Vicodin [hydrocodone-acetaminophen] Itching   Sulfa Antibiotics Rash      Medication List    TAKE these medications   acetaminophen 500 MG tablet Commonly known as:  TYLENOL Take 1,000 mg by mouth 3 (three) times daily.   artificial tears ointment Place 2 drops into the right eye at bedtime.   cephALEXin 250 MG capsule Commonly known as:  KEFLEX Take 1 capsule (250 mg total) by mouth every 12 (twelve) hours.   cetirizine 10 MG tablet Commonly known as:  ZYRTEC Take 10 mg by mouth every morning.   cloNIDine 0.1 MG tablet Commonly known as:  CATAPRES Take 0.1 mg by mouth 3 (three) times daily as needed (SBP >185).   diltiazem 120 MG tablet Commonly known as:  CARDIZEM Take 120 mg by mouth daily.   docusate sodium 100 MG capsule Commonly known as:  COLACE Take 100 mg by mouth 2 (two) times daily.   ENSURE Take 237 mLs by mouth 3 (three) times daily.   fluticasone 27.5 MCG/SPRAY nasal spray Commonly known as:  VERAMYST Place 1 spray into the nose daily.   haloperidol 2 MG/ML solution Commonly known as:  HALDOL Take 1 mL (2 mg total) by mouth  every 6 (six) hours as needed for agitation.   levalbuterol 0.63 MG/3ML nebulizer solution Commonly known as:  XOPENEX Take 3 mLs (0.63 mg total) by nebulization every 6 (six) hours as needed for wheezing or shortness of breath.   lisinopril 20 MG tablet Commonly known as:  PRINIVIL,ZESTRIL Take 20 mg by mouth daily.   LORazepam 0.5 MG tablet Commonly known as:  ATIVAN Take 1 tablet (0.5 mg total) by mouth every 6 (six) hours as needed for anxiety.   ondansetron 4 MG tablet Commonly known as:  ZOFRAN Take 1 tablet (4 mg total) by mouth every 6 (six) hours as needed for nausea.   oxyCODONE 5 MG immediate release tablet Commonly known as:  Oxy IR/ROXICODONE Take 1 tablet (5 mg total) by mouth  every 3 (three) hours as needed for moderate pain or severe pain.   oxyCODONE 5 MG immediate release tablet Commonly known as:  ROXICODONE Take 1 tablet (5 mg total) by mouth every 6 (six) hours.   polyethylene glycol packet Commonly known as:  MIRALAX / GLYCOLAX Take 17 g by mouth every other day.   ranitidine 150 MG capsule Commonly known as:  ZANTAC Take 150 mg by mouth daily as needed for heartburn (gerd).   simethicone 80 MG chewable tablet Commonly known as:  MYLICON Chew 517 mg by mouth 2 (two) times daily.   trolamine salicylate 10 % cream Commonly known as:  ASPERCREME Apply 1 application topically 4 (four) times daily as needed for muscle pain.   vitamin B-12 1000 MCG tablet Commonly known as:  CYANOCOBALAMIN Take 1,000 mcg by mouth daily.   zolpidem 5 MG tablet Commonly known as:  AMBIEN Take 1 tablet (5 mg total) by mouth at bedtime. For rest      Contact information for after-discharge care    Destination    [facility] .   Specialty:  [place] information: [place] [id number] [phone]             Allergies  Allergen Reactions  . Celebrex [Celecoxib]  Itching  . Morphine And Related Nausea Only  . Vicodin [Hydrocodone-Acetaminophen] Itching  . Sulfa Antibiotics Rash    Consultations:  Palliative Care Medicine  Procedures/Studies: Dg Pelvis 1-2 Views  Result Date: 03/17/2017 CLINICAL DATA:  Right femoral deformity after fall EXAM: RIGHT FEMUR 2 VIEWS; PELVIS - 1-2 VIEW COMPARISON:  None. FINDINGS: There are bilateral hip arthroplasties and right total knee arthroplasty. There is a oblique fracture of the distal right femur with medial and posterior displacement. The right total knee arthroplasty remains approximated. There is no pelvic fracture or diastasis. There is marked left convex lumbar scoliosis. Extensive vascular calcification. Findings of prior hernia repair. IMPRESSION: Oblique fracture of the distal right femur with posterior and medial displacement. The right total hip and knee arthroplasties remain approximated. Electronically Signed   By: Ulyses Jarred M.D.   On: 03/17/2017 05:31   Dg Femur, Min 2 Views Right  Result Date: 03/17/2017 CLINICAL DATA:  Right femoral deformity after fall EXAM: RIGHT FEMUR 2 VIEWS; PELVIS - 1-2 VIEW COMPARISON:  None. FINDINGS: There are bilateral hip arthroplasties and right total knee arthroplasty. There is a oblique fracture of the distal right femur with medial and posterior displacement. The right total knee arthroplasty remains approximated. There is no pelvic fracture or diastasis. There is marked left convex lumbar scoliosis. Extensive vascular calcification. Findings of prior hernia repair. IMPRESSION: Oblique fracture of the distal right femur with posterior and medial displacement. The right total hip and knee arthroplasties remain approximated. Electronically Signed   By: Ulyses Jarred M.D.   On: 03/17/2017 05:31    Subjective: Seen and examined and was more awake but still slightly confused. She stated she had leg pain this AM and thirsty and that she had not drank water in 2 days. Also  stated that she was wet and needed to be changed. Denied any CP or SOB. Stated she wanted her walker. No other complaints or concerns.   Discharge Exam: Vitals:   03/18/17 2207 03/19/17 0643  BP: (!) 151/84 (!) 162/77  Pulse: (!) 106 94  Resp: 14 15  Temp: 99.4 F (37.4 C) 98.3 F (36.8 C)   Vitals:  03/18/17 0538 03/18/17 1346 03/18/17 2207 03/19/17 0643  BP: 119/67 (!) 142/70 (!) 151/84 (!) 162/77  Pulse: 96 (!) 111 (!) 106 94  Resp: 15 14 14 15   Temp: 98.7 F (37.1 C) 98.4 F (36.9 C) 99.4 F (37.4 C) 98.3 F (36.8 C)  TempSrc: Oral Oral Oral Oral  SpO2: 97% 98% 97% 97%  Weight:      Height:       General: Pt is  awake, not in acute distress but complaining of some pain Cardiovascular: Distant Heart Sounds but RRR, S1/S2 +, no rubs, no gallops Respiratory: Slightly diminished bilaterally, no wheezing, no rhonchi Abdominal: Soft, NT, ND, bowel sounds + Extremities: no edema, no cyanosis; Right Leg rotated   The results of significant diagnostics from this hospitalization (including imaging, microbiology, ancillary and laboratory) are listed below for reference.    Microbiology: No results found for this or any previous visit (from the past 240 hour(s)).   Labs: BNP (last 3 results) No results for input(s): BNP in the last 8760 hours. Basic Metabolic Panel:  Recent Labs Lab 03/17/17 0528  NA 138  K 3.6  CL 100*  CO2 27  GLUCOSE 114*  BUN 14  CREATININE 0.59  CALCIUM 10.0   Liver Function Tests: No results for input(s): AST, ALT, ALKPHOS, BILITOT, PROT, ALBUMIN in the last 168 hours. No results for input(s): LIPASE, AMYLASE in the last 168 hours. No results for input(s): AMMONIA in the last 168 hours. CBC:  Recent Labs Lab 03/17/17 0528  WBC 10.5  NEUTROABS 8.0*  HGB 13.3  HCT 40.0  MCV 101.8*  PLT 359   Cardiac Enzymes: No results for input(s): CKTOTAL, CKMB, CKMBINDEX, TROPONINI in the last 168 hours. BNP: Invalid input(s): POCBNP CBG: No  results for input(s): GLUCAP in the last 168 hours. D-Dimer No results for input(s): DDIMER in the last 72 hours. Hgb A1c No results for input(s): HGBA1C in the last 72 hours. Lipid Profile No results for input(s): CHOL, HDL, LDLCALC, TRIG, CHOLHDL, LDLDIRECT in the last 72 hours. Thyroid function studies No results for input(s): TSH, T4TOTAL, T3FREE, THYROIDAB in the last 72 hours.  Invalid input(s): FREET3 Anemia work up No results for input(s): VITAMINB12, FOLATE, FERRITIN, TIBC, IRON, RETICCTPCT in the last 72 hours. Urinalysis    Component Value Date/Time   COLORURINE YELLOW 08/06/2014 1904   APPEARANCEUR CLEAR 08/06/2014 1904   LABSPEC 1.016 08/06/2014 1904   PHURINE 5.5 08/06/2014 1904   GLUCOSEU NEGATIVE 08/06/2014 1904   HGBUR NEGATIVE 08/06/2014 1904   BILIRUBINUR NEGATIVE 08/06/2014 1904   KETONESUR NEGATIVE 08/06/2014 1904   PROTEINUR 30 (A) 08/06/2014 1904   UROBILINOGEN 0.2 08/06/2014 1904   NITRITE NEGATIVE 08/06/2014 1904   LEUKOCYTESUR SMALL (A) 08/06/2014 1904   Sepsis Labs Invalid input(s): PROCALCITONIN,  WBC,  LACTICIDVEN Microbiology No results found for this or any previous visit (from the past 240 hour(s)).  Time coordinating discharge: 25 minutes  SIGNED:  Kerney Elbe, DO [facility] 03/19/2017, 9:55 AM Pager [phone]  If 7PM-7AM, please contact night-coverage [email or web address] Password [email or web address]

## 2017-03-20 ENCOUNTER — Non-Acute Institutional Stay (SKILLED_NURSING_FACILITY): Payer: Medicare Other | Admitting: Internal Medicine

## 2017-03-20 ENCOUNTER — Encounter: Payer: Self-pay | Admitting: Internal Medicine

## 2017-03-20 DIAGNOSIS — E46 Unspecified protein-calorie malnutrition: Secondary | ICD-10-CM | POA: Diagnosis not present

## 2017-03-20 DIAGNOSIS — F02818 Dementia in other diseases classified elsewhere, unspecified severity, with other behavioral disturbance: Secondary | ICD-10-CM

## 2017-03-20 DIAGNOSIS — G301 Alzheimer's disease with late onset: Secondary | ICD-10-CM | POA: Diagnosis not present

## 2017-03-20 DIAGNOSIS — K5901 Slow transit constipation: Secondary | ICD-10-CM

## 2017-03-20 DIAGNOSIS — H903 Sensorineural hearing loss, bilateral: Secondary | ICD-10-CM

## 2017-03-20 DIAGNOSIS — S72001A Fracture of unspecified part of neck of right femur, initial encounter for closed fracture: Secondary | ICD-10-CM

## 2017-03-20 DIAGNOSIS — K219 Gastro-esophageal reflux disease without esophagitis: Secondary | ICD-10-CM | POA: Diagnosis not present

## 2017-03-20 DIAGNOSIS — F0281 Dementia in other diseases classified elsewhere with behavioral disturbance: Secondary | ICD-10-CM | POA: Diagnosis not present

## 2017-03-20 DIAGNOSIS — I10 Essential (primary) hypertension: Secondary | ICD-10-CM | POA: Diagnosis not present

## 2017-03-20 DIAGNOSIS — F0151 Vascular dementia with behavioral disturbance: Secondary | ICD-10-CM | POA: Diagnosis not present

## 2017-03-20 DIAGNOSIS — H353230 Exudative age-related macular degeneration, bilateral, stage unspecified: Secondary | ICD-10-CM

## 2017-03-20 DIAGNOSIS — I495 Sick sinus syndrome: Secondary | ICD-10-CM | POA: Diagnosis not present

## 2017-03-20 DIAGNOSIS — J309 Allergic rhinitis, unspecified: Secondary | ICD-10-CM | POA: Diagnosis not present

## 2017-03-20 DIAGNOSIS — H11149 Conjunctival xerosis, unspecified, unspecified eye: Secondary | ICD-10-CM | POA: Diagnosis not present

## 2017-03-20 DIAGNOSIS — R54 Age-related physical debility: Secondary | ICD-10-CM | POA: Diagnosis not present

## 2017-03-20 DIAGNOSIS — S72401S Unspecified fracture of lower end of right femur, sequela: Secondary | ICD-10-CM | POA: Diagnosis not present

## 2017-03-20 DIAGNOSIS — M2450 Contracture, unspecified joint: Secondary | ICD-10-CM | POA: Diagnosis not present

## 2017-03-20 DIAGNOSIS — I672 Cerebral atherosclerosis: Secondary | ICD-10-CM | POA: Diagnosis not present

## 2017-03-20 DIAGNOSIS — E43 Unspecified severe protein-calorie malnutrition: Secondary | ICD-10-CM | POA: Diagnosis not present

## 2017-03-20 NOTE — Progress Notes (Signed)
Patient ID: [name], female   DOB: [birth year], 83 y.o.   MRN: [id number]  Provider:  Rexene Edison. Mariea Clonts, D.O., C.M.D. Location:  [facility] Room Number: [place] of Service:  SNF (31)  PCP: Gayland Curry, DO Patient Care Team: Gayland Curry, DO as PCP - General (Geriatric Medicine) Community, [facility]  Extended Emergency Contact Information Primary Emergency Contact: Thacker,Shirley Address: [address]  [place] of [place] Phone: [phone] Mobile Phone: [phone] Relation: Other Secondary Emergency Contact: Powell,Katherine  [place] Phone: [phone] Mobile Phone: [phone] Relation: Other  Code Status: DNR Goals of Care: Advanced Directive information Advanced Directives 03/20/2017  Does Patient Have a Medical Advance Directive? Yes  Type of Advance Directive Out of facility DNR (pink MOST or yellow form);Healthcare Power of Attorney  Does patient want to make changes to medical advance directive? -  Copy of [place] in Chart? Yes  Would patient like information on creating a medical advance directive? -  Pre-existing out of facility DNR order (yellow form or pink MOST form) Yellow form placed in chart (order not valid for inpatient use);Pink MOST form placed in chart (order not valid for inpatient use)   Chief Complaint  Patient presents with  . New Admit To SNF    readmission, fall, femur fracture, hospice    HPI: Patient is a 83 y.o. female seen today for readmission to SNF s/p hospitalization for fall with femoral fracture.  I had just seen her last week and we had more meaningful conversation than she is usually able to given her dementia, hearing and visual impairments.     She sustained a fall on 7/21 and was found on the floor in her room at 3am.  She had deformity of her right leg with external rotation and extension and was unable to get  up on her own with her walker.  Seh was sent to the ED and xrays showed fracture of the distal right femur with posterior and medial displacement.  Her family was present at bedside in the ED.  She was given fentanyl en route and again in ED for pain.  The leg was immobilized and she was admitted to the hospital for end of life care on a fentanyl drip.   Palliative care was consulted.  Dr. Micheline Rough saw her and discussed her goals of care.  Family was in agreement with return to [facility] care so this transition was initiated.  He added oxycodone q3h prn to her regimen po, but continued the fentanyl IV at that time.  Bowel regimen was initated. She was treated for suspected UTI with keflex.  She returned here on a fentanyl patch with the oxycodone scheduled and prn for breakthrough.    Past Medical History:  Diagnosis Date  . Arthritis   . Carpal tunnel syndrome on left   . Dementia   . Diverticulosis   . DJD (degenerative joint disease)   . Essential hypertension 06/29/2013   D/c 'd amlodipine  08/27/2014 due to low bp and relative tachycardia    . GERD (gastroesophageal reflux disease)   . Headache(784.0)   . High blood pressure    echo 09-normal  . Lower GI bleed 06/2013   required 2 units PRBC  . Macular degeneration syndrome   . Pleural effusion 08/07/2014  . Wears glasses   . Wears hearing aid    both  . Wears partial  dentures    top and bottom   Past Surgical History:  Procedure Laterality Date  . APPENDECTOMY    . CARPAL TUNNEL RELEASE  2/12   rt  . CARPAL TUNNEL RELEASE  04/23/2012   Procedure: CARPAL TUNNEL RELEASE;  Surgeon: Wynonia Sours, MD;  Location: [facility];  Service: Orthopedics;  Laterality: Left;  . EYE SURGERY     cataractas  . HIP SURGERY     left  . JOINT REPLACEMENT  2006   rt total hip x2  . KNEE SURGERY    . THYROID CYST EXCISION    . TONSILLECTOMY      reports that she quit smoking about 43 years ago. She has never used  smokeless tobacco. She reports that she drinks alcohol. She reports that she does not use drugs. Social History   Social History  . Marital status: Single    Spouse name: N/A  . Number of children: N/A  . Years of education: N/A   Occupational History  . Not on file.   Social History Main Topics  . Smoking status: Former Smoker    Quit date: 07/07/1973  . Smokeless tobacco: Never Used  . Alcohol use Yes  . Drug use: No  . Sexual activity: No   Other Topics Concern  . Not on file   Social History Narrative   Patient is Widowed.  Occupation: Housewife  Lives in single level home ([facility]) at [place] since 1993. Lives alone, has caregiver assistance &-12noon, 5-9pm daily [place]). Moved to skill unit 08/12/2014   Stopped smoking 1970s. Minimal alcohol history   Patient has Advanced planning documents:  DNR, HCPOA Danie Binder)                      Functional Status Survey:    Family History  Problem Relation Age of Onset  . Heart attack Mother   . Diabetes Father     Health Maintenance  Topic Date Due  . INFLUENZA VACCINE  03/28/2017  . TETANUS/TDAP  10/23/2026  . DEXA SCAN  Completed  . PNA vac Low Risk Adult  Completed    Allergies  Allergen Reactions  . Celebrex [Celecoxib] Itching  . Morphine And Related Nausea Only  . Vicodin [Hydrocodone-Acetaminophen] Itching  . Sulfa Antibiotics Rash    Outpatient Encounter Prescriptions as of 03/20/2017  Medication Sig  . Artificial Tear Ointment (ARTIFICIAL TEARS) ointment Place 2 drops into the right eye at bedtime.  . cephALEXin (KEFLEX) 250 MG capsule Take 1 capsule (250 mg total) by mouth every 12 (twelve) hours.  . cetirizine (ZYRTEC) 10 MG tablet Take 10 mg by mouth every morning.   . cloNIDine (CATAPRES) 0.1 MG tablet Take 0.1 mg by mouth 3 (three) times daily as needed (SBP >185).   Marland Kitchen diltiazem (CARDIZEM) 120 MG tablet Take 120 mg by mouth daily.  Marland Kitchen docusate  sodium (COLACE) 100 MG capsule Take 100 mg by mouth 2 (two) times daily.  . fluticasone (VERAMYST) 27.5 MCG/SPRAY nasal spray Place 1 spray into the nose daily.  . haloperidol (HALDOL) 2 MG/ML solution Take 1 mL (2 mg total) by mouth every 6 (six) hours as needed for agitation.  Marland Kitchen levalbuterol (XOPENEX) 0.63 MG/3ML nebulizer solution Take 3 mLs (0.63 mg total) by nebulization every 6 (six) hours as needed for wheezing or shortness of breath.  . lisinopril (PRINIVIL,ZESTRIL) 20 MG tablet Take 20 mg by mouth daily.  Marland Kitchen LORazepam (ATIVAN)  0.5 MG tablet Take 1 tablet (0.5 mg total) by mouth every 6 (six) hours as needed for anxiety.  . ondansetron (ZOFRAN) 4 MG tablet Take 1 tablet (4 mg total) by mouth every 6 (six) hours as needed for nausea.  Marland Kitchen oxyCODONE (OXY IR/ROXICODONE) 5 MG immediate release tablet Take 1 tablet (5 mg total) by mouth every 3 (three) hours as needed for moderate pain or severe pain.  Marland Kitchen oxyCODONE (ROXICODONE) 5 MG immediate release tablet Take 1 tablet (5 mg total) by mouth every 6 (six) hours.  . polyethylene glycol (MIRALAX / GLYCOLAX) packet Take 17 g by mouth every other day.   . simethicone (MYLICON) 80 MG chewable tablet Chew 160 mg by mouth 2 (two) times daily.  Marland Kitchen trolamine salicylate (ASPERCREME) 10 % cream Apply 1 application topically 4 (four) times daily as needed for muscle pain.  . vitamin B-12 (CYANOCOBALAMIN) 1000 MCG tablet Take 1,000 mcg by mouth daily.   Marland Kitchen zolpidem (AMBIEN) 5 MG tablet Take 1 tablet (5 mg total) by mouth at bedtime. For rest  . acetaminophen (TYLENOL) 500 MG tablet Take 1,000 mg by mouth 3 (three) times daily.   . [DISCONTINUED] ENSURE (ENSURE) Take 237 mLs by mouth 3 (three) times daily.  . [DISCONTINUED] ranitidine (ZANTAC) 150 MG capsule Take 150 mg by mouth daily as needed for heartburn (gerd).   No facility-administered encounter medications on file as of 03/20/2017.     Review of Systems  Reason unable to perform ROS: obtained from  nursing staff.  Constitutional: Positive for activity change and unexpected weight change. Negative for appetite change, chills, fatigue and fever.  HENT: Negative for congestion.   Eyes: Negative for visual disturbance.  Respiratory: Negative for apnea, chest tightness and shortness of breath.   Cardiovascular: Positive for leg swelling. Negative for chest pain.  Gastrointestinal: Negative for abdominal pain, blood in stool, constipation, nausea and vomiting.  Genitourinary: Negative for dysuria.  Musculoskeletal: Positive for arthralgias, gait problem and joint swelling. Negative for back pain.  Neurological: Positive for weakness. Negative for dizziness.  Hematological: Does not bruise/bleed easily.  Psychiatric/Behavioral: Negative for agitation.    Vitals:   03/20/17 1113  BP: 139/71  Pulse: 83  Resp: 16  Temp: 98.4 F (36.9 C)  TempSrc: Oral  SpO2: 94%  Weight: 87 lb (39.5 kg)   Body mass index is 16.99 kg/m. Physical Exam  Constitutional: No distress.  HENT:  Head: Normocephalic.  Eyes: Pupils are equal, round, and reactive to light.  Neck: Neck supple. No JVD present.  Cardiovascular: Normal rate, regular rhythm, normal heart sounds and intact distal pulses.   Pulmonary/Chest: Effort normal and breath sounds normal. No respiratory distress.  Abdominal: Soft. Bowel sounds are normal. She exhibits no distension. There is no tenderness.  Musculoskeletal:  External rotation and extension of right leg, swelling present  Lymphadenopathy:    She has no cervical adenopathy.  Neurological:  Sleeping peacefully and did not awaken for visit  Skin: Skin is warm and dry.    Labs reviewed: Basic Metabolic Panel:  Recent Labs  10/03/16 12/11/16 03/17/17 0528  NA 140 142 138  K 4.3 3.8 3.6  CL  --   --  100*  CO2  --   --  27  GLUCOSE  --   --  114*  BUN 17 14 14   CREATININE 0.7 0.6 0.59  CALCIUM  --   --  10.0   Liver Function Tests:  Recent Labs  12/11/16  AST 19  ALT 11  ALKPHOS 79   No results for input(s): LIPASE, AMYLASE in the last 8760 hours. No results for input(s): AMMONIA in the last 8760 hours. CBC:  Recent Labs  12/11/16 03/17/17 0528  WBC 10.3 10.5  NEUTROABS  --  8.0*  HGB 14.8 13.3  HCT 46 40.0  MCV  --  101.8*  PLT 247 359   Cardiac Enzymes: No results for input(s): CKTOTAL, CKMB, CKMBINDEX, TROPONINI in the last 8760 hours. BNP: Invalid input(s): POCBNP No results found for: HGBA1C Lab Results  Component Value Date   TSH 4.26 11/17/2014   No results found for: VITAMINB12 No results found for: FOLATE No results found for: IRON, TIBC, FERRITIN  Imaging and Procedures obtained prior to SNF admission: Dg Pelvis 1-2 Views  Result Date: 03/17/2017 CLINICAL DATA:  Right femoral deformity after fall EXAM: RIGHT FEMUR 2 VIEWS; PELVIS - 1-2 VIEW COMPARISON:  None. FINDINGS: There are bilateral hip arthroplasties and right total knee arthroplasty. There is a oblique fracture of the distal right femur with medial and posterior displacement. The right total knee arthroplasty remains approximated. There is no pelvic fracture or diastasis. There is marked left convex lumbar scoliosis. Extensive vascular calcification. Findings of prior hernia repair. IMPRESSION: Oblique fracture of the distal right femur with posterior and medial displacement. The right total hip and knee arthroplasties remain approximated. Electronically Signed   By: Ulyses Jarred M.D.   On: 03/17/2017 05:31   Dg Femur, Min 2 Views Right  Result Date: 03/17/2017 CLINICAL DATA:  Right femoral deformity after fall EXAM: RIGHT FEMUR 2 VIEWS; PELVIS - 1-2 VIEW COMPARISON:  None. FINDINGS: There are bilateral hip arthroplasties and right total knee arthroplasty. There is a oblique fracture of the distal right femur with medial and posterior displacement. The right total knee arthroplasty remains approximated. There is no pelvic fracture or diastasis. There is  marked left convex lumbar scoliosis. Extensive vascular calcification. Findings of prior hernia repair. IMPRESSION: Oblique fracture of the distal right femur with posterior and medial displacement. The right total hip and knee arthroplasties remain approximated. Electronically Signed   By: Ulyses Jarred M.D.   On: 03/17/2017 05:31    Assessment/Plan 1. Closed fracture of neck of right femur, initial encounter ([place]) Hospice now Change oxycodone to liquid as she's unable to swallow pills--same dosing D/c most po meds except for comfort and constipation, d/c ambien b/c she is not needing this with the fentanyl, oxycodone, haldol and ativan  2. Slow transit constipation -cont bowel regimen with miralax and colace, suppositories as needed  3. Late onset Alzheimer's disease with behavioral disturbance -moderate to severe, compounded by sensory impairments, goals are comfort, pt already had failure to thrive with progressive weight loss and malnutrition despite efforts to feed her what she liked and supplements  4. Sensorineural hearing loss (SNHL) of both ears -advanced, very HOH  5. Protein-calorie malnutrition, severe ([place]) -severe, has been losing weight for a long time as part of FTT and dementia despite best efforts as above  6. 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 Shylin Keizer, D.O. [facility] [address], [place] [address] Cell Phone (Mon-Fri 8am-5pm):  [phone] On Call:  [phone] & follow prompts after 5pm & weekends Office Phone:  [phone] Office Fax:  [phone]

## 2017-03-21 DIAGNOSIS — I10 Essential (primary) hypertension: Secondary | ICD-10-CM | POA: Diagnosis not present

## 2017-03-21 DIAGNOSIS — F0151 Vascular dementia with behavioral disturbance: Secondary | ICD-10-CM | POA: Diagnosis not present

## 2017-03-21 DIAGNOSIS — I495 Sick sinus syndrome: Secondary | ICD-10-CM | POA: Diagnosis not present

## 2017-03-21 DIAGNOSIS — E46 Unspecified protein-calorie malnutrition: Secondary | ICD-10-CM | POA: Diagnosis not present

## 2017-03-21 DIAGNOSIS — S72401S Unspecified fracture of lower end of right femur, sequela: Secondary | ICD-10-CM | POA: Diagnosis not present

## 2017-03-21 DIAGNOSIS — I672 Cerebral atherosclerosis: Secondary | ICD-10-CM | POA: Diagnosis not present

## 2017-03-22 DIAGNOSIS — I672 Cerebral atherosclerosis: Secondary | ICD-10-CM | POA: Diagnosis not present

## 2017-03-22 DIAGNOSIS — I10 Essential (primary) hypertension: Secondary | ICD-10-CM | POA: Diagnosis not present

## 2017-03-22 DIAGNOSIS — F0151 Vascular dementia with behavioral disturbance: Secondary | ICD-10-CM | POA: Diagnosis not present

## 2017-03-22 DIAGNOSIS — I495 Sick sinus syndrome: Secondary | ICD-10-CM | POA: Diagnosis not present

## 2017-03-22 DIAGNOSIS — S72401S Unspecified fracture of lower end of right femur, sequela: Secondary | ICD-10-CM | POA: Diagnosis not present

## 2017-03-22 DIAGNOSIS — E46 Unspecified protein-calorie malnutrition: Secondary | ICD-10-CM | POA: Diagnosis not present

## 2017-03-26 DIAGNOSIS — I672 Cerebral atherosclerosis: Secondary | ICD-10-CM | POA: Diagnosis not present

## 2017-03-26 DIAGNOSIS — I495 Sick sinus syndrome: Secondary | ICD-10-CM | POA: Diagnosis not present

## 2017-03-26 DIAGNOSIS — F0151 Vascular dementia with behavioral disturbance: Secondary | ICD-10-CM | POA: Diagnosis not present

## 2017-03-26 DIAGNOSIS — I10 Essential (primary) hypertension: Secondary | ICD-10-CM | POA: Diagnosis not present

## 2017-03-26 DIAGNOSIS — E46 Unspecified protein-calorie malnutrition: Secondary | ICD-10-CM | POA: Diagnosis not present

## 2017-03-26 DIAGNOSIS — S72401S Unspecified fracture of lower end of right femur, sequela: Secondary | ICD-10-CM | POA: Diagnosis not present

## 2017-03-28 DIAGNOSIS — K219 Gastro-esophageal reflux disease without esophagitis: Secondary | ICD-10-CM | POA: Diagnosis not present

## 2017-03-28 DIAGNOSIS — J309 Allergic rhinitis, unspecified: Secondary | ICD-10-CM | POA: Diagnosis not present

## 2017-03-28 DIAGNOSIS — F0151 Vascular dementia with behavioral disturbance: Secondary | ICD-10-CM | POA: Diagnosis not present

## 2017-03-28 DIAGNOSIS — I672 Cerebral atherosclerosis: Secondary | ICD-10-CM | POA: Diagnosis not present

## 2017-03-28 DIAGNOSIS — S72401S Unspecified fracture of lower end of right femur, sequela: Secondary | ICD-10-CM | POA: Diagnosis not present

## 2017-03-28 DIAGNOSIS — E46 Unspecified protein-calorie malnutrition: Secondary | ICD-10-CM | POA: Diagnosis not present

## 2017-03-28 DIAGNOSIS — H11149 Conjunctival xerosis, unspecified, unspecified eye: Secondary | ICD-10-CM | POA: Diagnosis not present

## 2017-03-28 DIAGNOSIS — I495 Sick sinus syndrome: Secondary | ICD-10-CM | POA: Diagnosis not present

## 2017-03-28 DIAGNOSIS — I10 Essential (primary) hypertension: Secondary | ICD-10-CM | POA: Diagnosis not present

## 2017-03-28 DIAGNOSIS — R54 Age-related physical debility: Secondary | ICD-10-CM | POA: Diagnosis not present

## 2017-03-28 DIAGNOSIS — M2450 Contracture, unspecified joint: Secondary | ICD-10-CM | POA: Diagnosis not present

## 2017-03-29 DIAGNOSIS — I495 Sick sinus syndrome: Secondary | ICD-10-CM | POA: Diagnosis not present

## 2017-03-29 DIAGNOSIS — S72401S Unspecified fracture of lower end of right femur, sequela: Secondary | ICD-10-CM | POA: Diagnosis not present

## 2017-03-29 DIAGNOSIS — I672 Cerebral atherosclerosis: Secondary | ICD-10-CM | POA: Diagnosis not present

## 2017-03-29 DIAGNOSIS — F0151 Vascular dementia with behavioral disturbance: Secondary | ICD-10-CM | POA: Diagnosis not present

## 2017-03-29 DIAGNOSIS — I10 Essential (primary) hypertension: Secondary | ICD-10-CM | POA: Diagnosis not present

## 2017-03-29 DIAGNOSIS — E46 Unspecified protein-calorie malnutrition: Secondary | ICD-10-CM | POA: Diagnosis not present

## 2017-04-02 DIAGNOSIS — I672 Cerebral atherosclerosis: Secondary | ICD-10-CM | POA: Diagnosis not present

## 2017-04-02 DIAGNOSIS — S72401S Unspecified fracture of lower end of right femur, sequela: Secondary | ICD-10-CM | POA: Diagnosis not present

## 2017-04-02 DIAGNOSIS — F0151 Vascular dementia with behavioral disturbance: Secondary | ICD-10-CM | POA: Diagnosis not present

## 2017-04-02 DIAGNOSIS — I10 Essential (primary) hypertension: Secondary | ICD-10-CM | POA: Diagnosis not present

## 2017-04-02 DIAGNOSIS — I495 Sick sinus syndrome: Secondary | ICD-10-CM | POA: Diagnosis not present

## 2017-04-02 DIAGNOSIS — E46 Unspecified protein-calorie malnutrition: Secondary | ICD-10-CM | POA: Diagnosis not present

## 2017-04-04 DIAGNOSIS — I10 Essential (primary) hypertension: Secondary | ICD-10-CM | POA: Diagnosis not present

## 2017-04-04 DIAGNOSIS — F0151 Vascular dementia with behavioral disturbance: Secondary | ICD-10-CM | POA: Diagnosis not present

## 2017-04-04 DIAGNOSIS — S72401S Unspecified fracture of lower end of right femur, sequela: Secondary | ICD-10-CM | POA: Diagnosis not present

## 2017-04-04 DIAGNOSIS — I495 Sick sinus syndrome: Secondary | ICD-10-CM | POA: Diagnosis not present

## 2017-04-04 DIAGNOSIS — E46 Unspecified protein-calorie malnutrition: Secondary | ICD-10-CM | POA: Diagnosis not present

## 2017-04-04 DIAGNOSIS — I672 Cerebral atherosclerosis: Secondary | ICD-10-CM | POA: Diagnosis not present

## 2017-04-06 ENCOUNTER — Encounter: Payer: Self-pay | Admitting: Adult Health

## 2017-04-06 ENCOUNTER — Non-Acute Institutional Stay (SKILLED_NURSING_FACILITY): Payer: Medicare Other | Admitting: Adult Health

## 2017-04-06 DIAGNOSIS — F0281 Dementia in other diseases classified elsewhere with behavioral disturbance: Secondary | ICD-10-CM

## 2017-04-06 DIAGNOSIS — K5901 Slow transit constipation: Secondary | ICD-10-CM

## 2017-04-06 DIAGNOSIS — R627 Adult failure to thrive: Secondary | ICD-10-CM | POA: Diagnosis not present

## 2017-04-06 DIAGNOSIS — S72401S Unspecified fracture of lower end of right femur, sequela: Secondary | ICD-10-CM | POA: Diagnosis not present

## 2017-04-06 DIAGNOSIS — I495 Sick sinus syndrome: Secondary | ICD-10-CM | POA: Diagnosis not present

## 2017-04-06 DIAGNOSIS — E46 Unspecified protein-calorie malnutrition: Secondary | ICD-10-CM | POA: Diagnosis not present

## 2017-04-06 DIAGNOSIS — R1310 Dysphagia, unspecified: Secondary | ICD-10-CM

## 2017-04-06 DIAGNOSIS — I10 Essential (primary) hypertension: Secondary | ICD-10-CM | POA: Diagnosis not present

## 2017-04-06 DIAGNOSIS — E43 Unspecified severe protein-calorie malnutrition: Secondary | ICD-10-CM

## 2017-04-06 DIAGNOSIS — I672 Cerebral atherosclerosis: Secondary | ICD-10-CM | POA: Diagnosis not present

## 2017-04-06 DIAGNOSIS — G301 Alzheimer's disease with late onset: Secondary | ICD-10-CM | POA: Diagnosis not present

## 2017-04-06 DIAGNOSIS — F0151 Vascular dementia with behavioral disturbance: Secondary | ICD-10-CM | POA: Diagnosis not present

## 2017-04-06 DIAGNOSIS — S72001S Fracture of unspecified part of neck of right femur, sequela: Secondary | ICD-10-CM

## 2017-04-06 NOTE — Progress Notes (Signed)
Location:  [facility]up[facility] of Service:  SNF (31) Provider:   Cindi Carbon, ANP [place] [phone]  Gayland Curry, DO  Patient Care Team: Gayland Curry, DO as PCP - General (Geriatric Medicine) [facility], [facility]  Extended Emergency Contact Information Primary Emergency Contact: Thacker,Shirley Address: [address]  [place] of [place] Phone: [phone] Mobile Phone: [phone] Relation: Other Secondary Emergency Contact: Powell,Katherine  [place] Phone: [phone] Mobile Phone: [phone] Relation: Other  Code Status:  DNR Goals of care: Advanced Directive information Advanced Directives 04/06/2017  Does Patient Have a Medical Advance Directive? Yes  Type of Advance Directive Out of facility DNR (pink MOST or yellow form);Healthcare Power of Attorney  Does patient want to make changes to medical advance directive? -  Copy of [place] in Chart? Yes  Would patient like information on creating a medical advance directive? -  Pre-existing out of facility DNR order (yellow form or pink MOST form) Yellow form placed in chart (order not valid for inpatient use);Pink MOST form placed in chart (order not valid for inpatient use)     Chief Complaint  Patient presents with  . Medical Management of Chronic Issues    HPI:  Pt is a 83 y.o. female residing in skilled care, seen today for medical management of chronic diseases.  She has dementia and is a long term resident of skilled care. On 03/17/17 she was sent to the hospital after a fall and was diagnosed with a right Oblique fracture of the distal right femur with posterior and medial displacement.  Due to her age and goals of care she was not operated on and returned to the facility on hospice. She has an abductor pillow and is bed bound with an air mattress. She is turned q 2 hrs per the staff and  does not have skin breakdown. She is scheduled oxycodone and needed an additional dose last night due to pain. Her personal care giver is at the bedside and states that she feels her pain is well controlled. Ms. Duchesneau is more confused since her fall and can't provide a hx.  She has a foley in place for comfort.  No sob or chest pain.  NO BM in 3 days.  Some difficulty swallowing food and was changed to puree diet. NO coughing or choking noted. Has dentures.  Overall she has had a steady decline in cognition and weight over the past year. Her intake is generally poor but worse since the fall Wt Readings from Last 3 Encounters:  04/06/17 84 lb 11.2 oz (38.4 kg)  03/20/17 87 lb (39.5 kg)  03/17/17 85 lb (38.6 kg)     Past Medical History:  Diagnosis Date  . Arthritis   . Carpal tunnel syndrome on left   . Dementia   . Diverticulosis   . DJD (degenerative joint disease)   . Essential hypertension 06/29/2013   D/c 'd amlodipine  08/27/2014 due to low bp and relative tachycardia    . GERD (gastroesophageal reflux disease)   . Headache(784.0)   . High blood pressure    echo 09-normal  . Lower GI bleed 06/2013   required 2 units PRBC  . Macular degeneration syndrome   . Pleural effusion 08/07/2014  . Wears glasses   . Wears hearing aid    both  . Wears partial dentures    top and bottom   Past Surgical History:  Procedure Laterality Date  . APPENDECTOMY    . CARPAL TUNNEL RELEASE  2/12   rt  . CARPAL TUNNEL RELEASE  04/23/2012   Procedure: CARPAL TUNNEL RELEASE;  Surgeon: Wynonia Sours, MD;  Location: [facility];  Service: Orthopedics;  Laterality: Left;  . EYE SURGERY     cataractas  . HIP SURGERY     left  . JOINT REPLACEMENT  2006   rt total hip x2  . KNEE SURGERY    . THYROID CYST EXCISION    . TONSILLECTOMY      Allergies  Allergen Reactions  . Celebrex [Celecoxib] Itching  . Morphine And Related Nausea Only  . Vicodin [Hydrocodone-Acetaminophen]  Itching  . Sulfa Antibiotics Rash    Outpatient Encounter Prescriptions as of 04/06/2017  Medication Sig  . albuterol (ACCUNEB) 1.25 MG/3ML nebulizer solution Take 1 ampule by nebulization every 6 (six) hours as needed for wheezing.  . Artificial Tear Ointment (ARTIFICIAL TEARS) ointment Place 2 drops into the right eye at bedtime.  . carboxymethylcellulose (REFRESH PLUS) 0.5 % SOLN 2 drops at bedtime.  . haloperidol (HALDOL) 2 MG/ML solution Take 1 mL (2 mg total) by mouth every 6 (six) hours as needed for agitation.  Marland Kitchen LORazepam (ATIVAN) 0.5 MG tablet Take 1 tablet (0.5 mg total) by mouth every 6 (six) hours as needed for anxiety.  . ondansetron (ZOFRAN) 4 MG tablet Take 1 tablet (4 mg total) by mouth every 6 (six) hours as needed for nausea.  Marland Kitchen oxyCODONE (OXY IR/ROXICODONE) 5 MG immediate release tablet Take 1 tablet (5 mg total) by mouth every 3 (three) hours as needed for moderate pain or severe pain.  Marland Kitchen oxyCODONE (ROXICODONE) 5 MG immediate release tablet Take 1 tablet (5 mg total) by mouth every 6 (six) hours.  . [DISCONTINUED] levalbuterol (XOPENEX) 0.63 MG/3ML nebulizer solution Take 3 mLs (0.63 mg total) by nebulization every 6 (six) hours as needed for wheezing or shortness of breath.  . [DISCONTINUED] acetaminophen (TYLENOL) 500 MG tablet Take 1,000 mg by mouth 3 (three) times daily.   . [DISCONTINUED] diltiazem (CARDIZEM) 120 MG tablet Take 120 mg by mouth daily.  . [DISCONTINUED] docusate sodium (COLACE) 100 MG capsule Take 100 mg by mouth 2 (two) times daily.  . [DISCONTINUED] polyethylene glycol (MIRALAX / GLYCOLAX) packet Take 17 g by mouth every other day.   . [DISCONTINUED] trolamine salicylate (ASPERCREME) 10 % cream Apply 1 application topically 4 (four) times daily as needed for muscle pain.   No facility-administered encounter medications on file as of 04/06/2017.     Review of Systems  Unable to perform ROS: Dementia    Immunization History  Administered Date(s)  Administered  . Influenza Inj Mdck Quad Pf 06/15/2016  . Influenza Split 05/28/2014  . Influenza-Unspecified 06/10/2015  . Pneumococcal Conjugate-13 03/03/2014  . Pneumococcal Polysaccharide-23 04/05/2016  . Td 10/23/2016   Pertinent  Health Maintenance Due  Topic Date Due  . INFLUENZA VACCINE  03/28/2017  . DEXA SCAN  Completed  . PNA vac Low Risk Adult  Completed   Fall Risk  12/27/2016 08/12/2015 07/15/2015 02/15/2015 01/07/2015  Falls in the past year? No Yes Yes Yes Yes  Number falls in past yr: - 1 1 1 1   Injury with Fall? - No - - Yes  Risk Factor Category  - - - High Fall Risk High Fall Risk  Risk for fall due to : - History of fall(s);Impaired balance/gait History of fall(s) History of fall(s) History of  fall(s)  Follow up - Falls evaluation completed Falls evaluation completed - Falls evaluation completed   Functional Status Survey:    Vitals:   04/06/17 1107  Weight: 84 lb 11.2 oz (38.4 kg)   Body mass index is 16.54 kg/m. Physical Exam  Constitutional: No distress.  HENT:  Head: Normocephalic and atraumatic.  Refused oral exam  Neck: No JVD present.  Cardiovascular: Normal rate and regular rhythm.   No murmur heard. Pulmonary/Chest: Effort normal and breath sounds normal. No respiratory distress. She has no wheezes.  Abdominal: Soft. Bowel sounds are normal. She exhibits no distension. There is no tenderness.  Neurological:  Sleepy, resistant to assessment, not oriented. Would not follow commands  Skin: Skin is warm and dry. She is not diaphoretic.  Bruising and swelling to right knee  Psychiatric: She has a normal mood and affect.  Nursing note and vitals reviewed.   Labs reviewed:  Recent Labs  10/03/16 12/11/16 03/17/17 0528  NA 140 142 138  K 4.3 3.8 3.6  CL  --   --  100*  CO2  --   --  27  GLUCOSE  --   --  114*  BUN 17 14 14   CREATININE 0.7 0.6 0.59  CALCIUM  --   --  10.0    Recent Labs  12/11/16  AST 19  ALT 11  ALKPHOS 79     Recent Labs  12/11/16 03/17/17 0528  WBC 10.3 10.5  NEUTROABS  --  8.0*  HGB 14.8 13.3  HCT 46 40.0  MCV  --  101.8*  PLT 247 359   Lab Results  Component Value Date   TSH 4.26 11/17/2014   No results found for: HGBA1C No results found for: CHOL, HDL, LDLCALC, LDLDIRECT, TRIG, CHOLHDL  Significant Diagnostic Results in last 30 days:  Dg Pelvis 1-2 Views  Result Date: 03/17/2017 CLINICAL DATA:  Right femoral deformity after fall EXAM: RIGHT FEMUR 2 VIEWS; PELVIS - 1-2 VIEW COMPARISON:  None. FINDINGS: There are bilateral hip arthroplasties and right total knee arthroplasty. There is a oblique fracture of the distal right femur with medial and posterior displacement. The right total knee arthroplasty remains approximated. There is no pelvic fracture or diastasis. There is marked left convex lumbar scoliosis. Extensive vascular calcification. Findings of prior hernia repair. IMPRESSION: Oblique fracture of the distal right femur with posterior and medial displacement. The right total hip and knee arthroplasties remain approximated. Electronically Signed   By: Ulyses Jarred M.D.   On: 03/17/2017 05:31   Dg Femur, Min 2 Views Right  Result Date: 03/17/2017 CLINICAL DATA:  Right femoral deformity after fall EXAM: RIGHT FEMUR 2 VIEWS; PELVIS - 1-2 VIEW COMPARISON:  None. FINDINGS: There are bilateral hip arthroplasties and right total knee arthroplasty. There is a oblique fracture of the distal right femur with medial and posterior displacement. The right total knee arthroplasty remains approximated. There is no pelvic fracture or diastasis. There is marked left convex lumbar scoliosis. Extensive vascular calcification. Findings of prior hernia repair. IMPRESSION: Oblique fracture of the distal right femur with posterior and medial displacement. The right total hip and knee arthroplasties remain approximated. Electronically Signed   By: Ulyses Jarred M.D.   On: 03/17/2017 05:31     Assessment/Plan  1. Protein-calorie malnutrition, severe ([place]) POA request ensure 1 can qd (she used to refuse this but now may be more compliant)  2. FTT (failure to thrive) in adult Progressive cognitive losses Progressive weight loss Now complicated  by acute fracture  3. Closed fracture of right hip, sequela Pain well controlled with oxycodone 5 mg q 6 hrs scheduled and prn Turn q 2 hrs with hip precautions and use abductor pad  4. Slow transit constipation Add senokot s 1 qhs  5. Late onset Alzheimer's disease with behavioral disturbance Progressive decline in cognition and function c/w AD, now confounded by recent fall  6. Dysphagia, unspecified type Continue puree diet with asp prec Clean dentures qhs  Family/ staff Communication: discussed with POA [place]  Labs/tests ordered:  NA, followed by hospice

## 2017-04-10 DIAGNOSIS — F0151 Vascular dementia with behavioral disturbance: Secondary | ICD-10-CM | POA: Diagnosis not present

## 2017-04-10 DIAGNOSIS — I495 Sick sinus syndrome: Secondary | ICD-10-CM | POA: Diagnosis not present

## 2017-04-10 DIAGNOSIS — E46 Unspecified protein-calorie malnutrition: Secondary | ICD-10-CM | POA: Diagnosis not present

## 2017-04-10 DIAGNOSIS — S72401S Unspecified fracture of lower end of right femur, sequela: Secondary | ICD-10-CM | POA: Diagnosis not present

## 2017-04-10 DIAGNOSIS — I672 Cerebral atherosclerosis: Secondary | ICD-10-CM | POA: Diagnosis not present

## 2017-04-10 DIAGNOSIS — I10 Essential (primary) hypertension: Secondary | ICD-10-CM | POA: Diagnosis not present

## 2017-04-13 DIAGNOSIS — I672 Cerebral atherosclerosis: Secondary | ICD-10-CM | POA: Diagnosis not present

## 2017-04-13 DIAGNOSIS — E46 Unspecified protein-calorie malnutrition: Secondary | ICD-10-CM | POA: Diagnosis not present

## 2017-04-13 DIAGNOSIS — S72401S Unspecified fracture of lower end of right femur, sequela: Secondary | ICD-10-CM | POA: Diagnosis not present

## 2017-04-13 DIAGNOSIS — F0151 Vascular dementia with behavioral disturbance: Secondary | ICD-10-CM | POA: Diagnosis not present

## 2017-04-13 DIAGNOSIS — I495 Sick sinus syndrome: Secondary | ICD-10-CM | POA: Diagnosis not present

## 2017-04-13 DIAGNOSIS — I10 Essential (primary) hypertension: Secondary | ICD-10-CM | POA: Diagnosis not present

## 2017-04-16 DIAGNOSIS — F0151 Vascular dementia with behavioral disturbance: Secondary | ICD-10-CM | POA: Diagnosis not present

## 2017-04-16 DIAGNOSIS — S72401S Unspecified fracture of lower end of right femur, sequela: Secondary | ICD-10-CM | POA: Diagnosis not present

## 2017-04-16 DIAGNOSIS — E46 Unspecified protein-calorie malnutrition: Secondary | ICD-10-CM | POA: Diagnosis not present

## 2017-04-16 DIAGNOSIS — I10 Essential (primary) hypertension: Secondary | ICD-10-CM | POA: Diagnosis not present

## 2017-04-16 DIAGNOSIS — I672 Cerebral atherosclerosis: Secondary | ICD-10-CM | POA: Diagnosis not present

## 2017-04-16 DIAGNOSIS — I495 Sick sinus syndrome: Secondary | ICD-10-CM | POA: Diagnosis not present

## 2017-04-18 DIAGNOSIS — I10 Essential (primary) hypertension: Secondary | ICD-10-CM | POA: Diagnosis not present

## 2017-04-18 DIAGNOSIS — F0151 Vascular dementia with behavioral disturbance: Secondary | ICD-10-CM | POA: Diagnosis not present

## 2017-04-18 DIAGNOSIS — E46 Unspecified protein-calorie malnutrition: Secondary | ICD-10-CM | POA: Diagnosis not present

## 2017-04-18 DIAGNOSIS — I495 Sick sinus syndrome: Secondary | ICD-10-CM | POA: Diagnosis not present

## 2017-04-18 DIAGNOSIS — I672 Cerebral atherosclerosis: Secondary | ICD-10-CM | POA: Diagnosis not present

## 2017-04-18 DIAGNOSIS — S72401S Unspecified fracture of lower end of right femur, sequela: Secondary | ICD-10-CM | POA: Diagnosis not present

## 2017-04-23 DIAGNOSIS — F0151 Vascular dementia with behavioral disturbance: Secondary | ICD-10-CM | POA: Diagnosis not present

## 2017-04-23 DIAGNOSIS — S72401S Unspecified fracture of lower end of right femur, sequela: Secondary | ICD-10-CM | POA: Diagnosis not present

## 2017-04-23 DIAGNOSIS — E46 Unspecified protein-calorie malnutrition: Secondary | ICD-10-CM | POA: Diagnosis not present

## 2017-04-23 DIAGNOSIS — I495 Sick sinus syndrome: Secondary | ICD-10-CM | POA: Diagnosis not present

## 2017-04-23 DIAGNOSIS — I672 Cerebral atherosclerosis: Secondary | ICD-10-CM | POA: Diagnosis not present

## 2017-04-23 DIAGNOSIS — I10 Essential (primary) hypertension: Secondary | ICD-10-CM | POA: Diagnosis not present

## 2017-04-26 DIAGNOSIS — E46 Unspecified protein-calorie malnutrition: Secondary | ICD-10-CM | POA: Diagnosis not present

## 2017-04-26 DIAGNOSIS — F0151 Vascular dementia with behavioral disturbance: Secondary | ICD-10-CM | POA: Diagnosis not present

## 2017-04-26 DIAGNOSIS — I10 Essential (primary) hypertension: Secondary | ICD-10-CM | POA: Diagnosis not present

## 2017-04-26 DIAGNOSIS — S72401S Unspecified fracture of lower end of right femur, sequela: Secondary | ICD-10-CM | POA: Diagnosis not present

## 2017-04-26 DIAGNOSIS — I495 Sick sinus syndrome: Secondary | ICD-10-CM | POA: Diagnosis not present

## 2017-04-26 DIAGNOSIS — I672 Cerebral atherosclerosis: Secondary | ICD-10-CM | POA: Diagnosis not present

## 2017-04-27 DIAGNOSIS — F0151 Vascular dementia with behavioral disturbance: Secondary | ICD-10-CM | POA: Diagnosis not present

## 2017-04-27 DIAGNOSIS — E46 Unspecified protein-calorie malnutrition: Secondary | ICD-10-CM | POA: Diagnosis not present

## 2017-04-27 DIAGNOSIS — I672 Cerebral atherosclerosis: Secondary | ICD-10-CM | POA: Diagnosis not present

## 2017-04-27 DIAGNOSIS — I10 Essential (primary) hypertension: Secondary | ICD-10-CM | POA: Diagnosis not present

## 2017-04-27 DIAGNOSIS — S72401S Unspecified fracture of lower end of right femur, sequela: Secondary | ICD-10-CM | POA: Diagnosis not present

## 2017-04-27 DIAGNOSIS — I495 Sick sinus syndrome: Secondary | ICD-10-CM | POA: Diagnosis not present

## 2017-04-28 DIAGNOSIS — R54 Age-related physical debility: Secondary | ICD-10-CM | POA: Diagnosis not present

## 2017-04-28 DIAGNOSIS — F0151 Vascular dementia with behavioral disturbance: Secondary | ICD-10-CM | POA: Diagnosis not present

## 2017-04-28 DIAGNOSIS — S72401S Unspecified fracture of lower end of right femur, sequela: Secondary | ICD-10-CM | POA: Diagnosis not present

## 2017-04-28 DIAGNOSIS — E46 Unspecified protein-calorie malnutrition: Secondary | ICD-10-CM | POA: Diagnosis not present

## 2017-04-28 DIAGNOSIS — K219 Gastro-esophageal reflux disease without esophagitis: Secondary | ICD-10-CM | POA: Diagnosis not present

## 2017-04-28 DIAGNOSIS — I672 Cerebral atherosclerosis: Secondary | ICD-10-CM | POA: Diagnosis not present

## 2017-04-28 DIAGNOSIS — H11149 Conjunctival xerosis, unspecified, unspecified eye: Secondary | ICD-10-CM | POA: Diagnosis not present

## 2017-04-28 DIAGNOSIS — J309 Allergic rhinitis, unspecified: Secondary | ICD-10-CM | POA: Diagnosis not present

## 2017-04-28 DIAGNOSIS — M2450 Contracture, unspecified joint: Secondary | ICD-10-CM | POA: Diagnosis not present

## 2017-04-28 DIAGNOSIS — I495 Sick sinus syndrome: Secondary | ICD-10-CM | POA: Diagnosis not present

## 2017-04-28 DIAGNOSIS — I10 Essential (primary) hypertension: Secondary | ICD-10-CM | POA: Diagnosis not present

## 2017-04-30 DIAGNOSIS — F0151 Vascular dementia with behavioral disturbance: Secondary | ICD-10-CM | POA: Diagnosis not present

## 2017-04-30 DIAGNOSIS — E46 Unspecified protein-calorie malnutrition: Secondary | ICD-10-CM | POA: Diagnosis not present

## 2017-04-30 DIAGNOSIS — I10 Essential (primary) hypertension: Secondary | ICD-10-CM | POA: Diagnosis not present

## 2017-04-30 DIAGNOSIS — I495 Sick sinus syndrome: Secondary | ICD-10-CM | POA: Diagnosis not present

## 2017-04-30 DIAGNOSIS — S72401S Unspecified fracture of lower end of right femur, sequela: Secondary | ICD-10-CM | POA: Diagnosis not present

## 2017-04-30 DIAGNOSIS — I672 Cerebral atherosclerosis: Secondary | ICD-10-CM | POA: Diagnosis not present

## 2017-05-01 DIAGNOSIS — S72401S Unspecified fracture of lower end of right femur, sequela: Secondary | ICD-10-CM | POA: Diagnosis not present

## 2017-05-01 DIAGNOSIS — E46 Unspecified protein-calorie malnutrition: Secondary | ICD-10-CM | POA: Diagnosis not present

## 2017-05-01 DIAGNOSIS — I10 Essential (primary) hypertension: Secondary | ICD-10-CM | POA: Diagnosis not present

## 2017-05-01 DIAGNOSIS — I672 Cerebral atherosclerosis: Secondary | ICD-10-CM | POA: Diagnosis not present

## 2017-05-01 DIAGNOSIS — I495 Sick sinus syndrome: Secondary | ICD-10-CM | POA: Diagnosis not present

## 2017-05-01 DIAGNOSIS — F0151 Vascular dementia with behavioral disturbance: Secondary | ICD-10-CM | POA: Diagnosis not present

## 2017-05-03 ENCOUNTER — Encounter: Payer: Self-pay | Admitting: Adult Health

## 2017-05-03 ENCOUNTER — Non-Acute Institutional Stay (SKILLED_NURSING_FACILITY): Payer: Medicare Other | Admitting: Adult Health

## 2017-05-03 DIAGNOSIS — S72001G Fracture of unspecified part of neck of right femur, subsequent encounter for closed fracture with delayed healing: Secondary | ICD-10-CM | POA: Diagnosis not present

## 2017-05-03 DIAGNOSIS — E43 Unspecified severe protein-calorie malnutrition: Secondary | ICD-10-CM

## 2017-05-03 DIAGNOSIS — K5901 Slow transit constipation: Secondary | ICD-10-CM | POA: Diagnosis not present

## 2017-05-03 DIAGNOSIS — T839XXA Unspecified complication of genitourinary prosthetic device, implant and graft, initial encounter: Secondary | ICD-10-CM | POA: Diagnosis not present

## 2017-05-03 DIAGNOSIS — L89899 Pressure ulcer of other site, unspecified stage: Secondary | ICD-10-CM

## 2017-05-03 NOTE — Progress Notes (Signed)
Location:  Occupational psychologist of Service:  SNF (31) Provider:   Cindi Carbon, ANP [place] [phone]  Gayland Curry, DO  Patient Care Team: Gayland Curry, DO as PCP - General (Geriatric Medicine) Fay Records, [facility]  Extended Emergency Contact Information Primary Emergency Contact: Thacker,Shirley Address: [address]  [place] Phone: [phone] Mobile Phone: [phone] Relation: Other Secondary Emergency Contact: Powell,Katherine  [place] Phone: [phone] Mobile Phone: [phone] Relation: Other  Code Status:  DNR Goals of care: Advanced Directive information Advanced Directives 05/03/2017  Does Patient Have a Medical Advance Directive? Yes  Type of Advance Directive Out of facility DNR (pink MOST or yellow form);Healthcare Power of Attorney  Does patient want to make changes to medical advance directive? -  Copy of [place] in Chart? Yes  Would patient like information on creating a medical advance directive? -  Pre-existing out of facility DNR order (yellow form or pink MOST form) Yellow form placed in chart (order not valid for inpatient use);Pink MOST form placed in chart (order not valid for inpatient use)     Chief Complaint  Patient presents with  . Medical Management of Chronic Issues    HPI:  Pt is a 83 y.o. female residing in skilled care, seen today for medical management of chronic diseases.  On 03/17/17 she was sent to the hospital after a fall and was diagnosed with a right oblique fracture of the distal right femur with posterior and medial displacement.  Due to her age and goals of care she was not operated on and returned to the facility on hospice.  Ms Lippy continues with a poor appetite and appears to be losing weight but is not weighed regularly due to her goals of care.   Has used prn oxycodone 2 x in the past  14 days.  No prn haldol use.  Her caregiver reports that her foley has sediment in it and dark urine and she has tried to encourage her to drink water.    Puree diet for comfort. Not consistently taking the ensure supplement.  No BM in 3 days, refusing senokot.  She has several wounds to her legs on the right and left lower ext that the wound care nurse is following. The staff try to keep her turned q 2 hrs and she has an air mattress.    Past Medical History:  Diagnosis Date  . Arthritis   . Carpal tunnel syndrome on left   . Dementia   . Diverticulosis   . DJD (degenerative joint disease)   . Essential hypertension 06/29/2013   D/c 'd amlodipine  08/27/2014 due to low bp and relative tachycardia    . GERD (gastroesophageal reflux disease)   . Headache(784.0)   . High blood pressure    echo 09-normal  . Lower GI bleed 06/2013   required 2 units PRBC  . Macular degeneration syndrome   . Pleural effusion 08/07/2014  . Wears glasses   . Wears hearing aid    both  . Wears partial dentures    top and bottom   Past Surgical History:  Procedure Laterality Date  . APPENDECTOMY    . CARPAL TUNNEL RELEASE  2/12   rt  . CARPAL TUNNEL RELEASE  04/23/2012   Procedure: CARPAL TUNNEL RELEASE;  Surgeon: Wynonia Sours, MD;  Location: [place];  Service: Orthopedics;  Laterality: Left;  .  EYE SURGERY     cataractas  . HIP SURGERY     left  . JOINT REPLACEMENT  2006   rt total hip x2  . KNEE SURGERY    . THYROID CYST EXCISION    . TONSILLECTOMY      Allergies  Allergen Reactions  . Celebrex [Celecoxib] Itching  . Morphine And Related Nausea Only  . Vicodin [Hydrocodone-Acetaminophen] Itching  . Sulfa Antibiotics Rash    Outpatient Encounter Prescriptions as of 05/03/2017  Medication Sig  . albuterol (ACCUNEB) 1.25 MG/3ML nebulizer solution Take 1 ampule by nebulization every 6 (six) hours as needed for wheezing.  . Artificial Tear Ointment (ARTIFICIAL TEARS)  ointment Place 2 drops into the right eye at bedtime.  . carboxymethylcellulose (REFRESH PLUS) 0.5 % SOLN 2 drops at bedtime.  . haloperidol (HALDOL) 2 MG/ML solution Take 1 mL (2 mg total) by mouth every 6 (six) hours as needed for agitation.  Marland Kitchen LORazepam (ATIVAN) 0.5 MG tablet Take 1 tablet (0.5 mg total) by mouth every 6 (six) hours as needed for anxiety.  . ondansetron (ZOFRAN) 4 MG tablet Take 1 tablet (4 mg total) by mouth every 6 (six) hours as needed for nausea.  Marland Kitchen oxyCODONE (OXY IR/ROXICODONE) 5 MG immediate release tablet Take 1 tablet (5 mg total) by mouth every 3 (three) hours as needed for moderate pain or severe pain.  Marland Kitchen oxyCODONE (ROXICODONE) 5 MG immediate release tablet Take 1 tablet (5 mg total) by mouth every 6 (six) hours.   No facility-administered encounter medications on file as of 05/03/2017.     Review of Systems  Unable to perform ROS: Dementia    Immunization History  Administered Date(s) Administered  . Influenza Inj Mdck Quad Pf 06/15/2016  . Influenza Split 05/28/2014  . Influenza-Unspecified 06/10/2015  . Pneumococcal Conjugate-13 03/03/2014  . Pneumococcal Polysaccharide-23 04/05/2016  . Td 10/23/2016   Pertinent  Health Maintenance Due  Topic Date Due  . INFLUENZA VACCINE  03/28/2017  . DEXA SCAN  Completed  . PNA vac Low Risk Adult  Completed   Fall Risk  12/27/2016 08/12/2015 07/15/2015 02/15/2015 01/07/2015  Falls in the past year? No Yes Yes Yes Yes  Number falls in past yr: - 1 1 1 1   Injury with Fall? - No - - Yes  Risk Factor Category  - - - High Fall Risk High Fall Risk  Risk for fall due to : - History of fall(s);Impaired balance/gait History of fall(s) History of fall(s) History of fall(s)  Follow up - Falls evaluation completed Falls evaluation completed - Falls evaluation completed   Functional Status Survey:    Vitals:   05/03/17 1137  Temp: 98.2 F (36.8 C)   There is no height or weight on file to calculate BMI. Physical Exam    Constitutional: No distress.  HENT:  Head: Normocephalic and atraumatic.  Neck: No JVD present.  Cardiovascular: Normal rate and regular rhythm.   No murmur heard. Pulmonary/Chest: Effort normal and breath sounds normal. No respiratory distress. She has no wheezes.  Abdominal: Soft. Bowel sounds are normal. She exhibits no distension. There is no tenderness.  Neurological: She is alert.  Sleepy, oriented to self only  Skin: Skin is warm and dry. She is not diaphoretic.  Bruising and swelling to right knee noted.  Right upper shin with skin tear 100% pink tissue and yellow drainage noted on dressing with steri strips in place. RLE wound not assessed as resident refused due to agitation and pain.  LLE wound covered with a foam dressing which was stuck to the wound. Surrounding tissue without redness or swelling or drainage.   Psychiatric: She has a normal mood and affect.  Nursing note and vitals reviewed.   Labs reviewed:  Recent Labs  10/03/16 12/11/16 03/17/17 0528  NA 140 142 138  K 4.3 3.8 3.6  CL  --   --  100*  CO2  --   --  27  GLUCOSE  --   --  114*  BUN 17 14 14   CREATININE 0.7 0.6 0.59  CALCIUM  --   --  10.0    Recent Labs  12/11/16  AST 19  ALT 11  ALKPHOS 79    Recent Labs  12/11/16 03/17/17 0528  WBC 10.3 10.5  NEUTROABS  --  8.0*  HGB 14.8 13.3  HCT 46 40.0  MCV  --  101.8*  PLT 247 359   Lab Results  Component Value Date   TSH 4.26 11/17/2014   No results found for: HGBA1C No results found for: CHOL, HDL, LDLCALC, LDLDIRECT, TRIG, CHOLHDL  Significant Diagnostic Results in last 30 days:  No results found.  Assessment/Plan  1. Slow transit constipation Change senokot admin time to morning to see if this helps with compliance Dulcolax supp today and qd prn  2. Protein-calorie malnutrition, severe ([place]) Continue ensure 1 can daily, aware that she intermittently does not take it  3. Closed fracture of right hip with delayed healing,  subsequent encounter Pain controlled with current regimen Continue scheduled oxycodone 5 mg q 6 hrs and prn Keep turned q 2 hrs Hip precautions  4. Decubitus ulcer of calf, unspecified laterality, unspecified ulcer stage Continue foam dressing to left lower ext Continue mepilex to RLE.  5. Foley catheter problem, initial encounter ([place]) Encourage fluid when possible Flush catheter when needed No specific signs of infection, would not treat aggressively due to goals of care.  Ms.Lao is increasingly frail, with progressive weight loss, cognitive, and functional decline. We appreciate hospice care.     Family/ staff Communication: discussed with POA Enid Derry  Labs/tests ordered:  NA, followed by hospice

## 2017-05-07 DIAGNOSIS — E46 Unspecified protein-calorie malnutrition: Secondary | ICD-10-CM | POA: Diagnosis not present

## 2017-05-07 DIAGNOSIS — F0151 Vascular dementia with behavioral disturbance: Secondary | ICD-10-CM | POA: Diagnosis not present

## 2017-05-07 DIAGNOSIS — I672 Cerebral atherosclerosis: Secondary | ICD-10-CM | POA: Diagnosis not present

## 2017-05-07 DIAGNOSIS — I495 Sick sinus syndrome: Secondary | ICD-10-CM | POA: Diagnosis not present

## 2017-05-07 DIAGNOSIS — S72401S Unspecified fracture of lower end of right femur, sequela: Secondary | ICD-10-CM | POA: Diagnosis not present

## 2017-05-07 DIAGNOSIS — I10 Essential (primary) hypertension: Secondary | ICD-10-CM | POA: Diagnosis not present

## 2017-05-10 ENCOUNTER — Non-Acute Institutional Stay (SKILLED_NURSING_FACILITY): Payer: Medicare Other | Admitting: Adult Health

## 2017-05-10 ENCOUNTER — Encounter: Payer: Self-pay | Admitting: Adult Health

## 2017-05-10 DIAGNOSIS — S72491K Other fracture of lower end of right femur, subsequent encounter for closed fracture with nonunion: Secondary | ICD-10-CM | POA: Diagnosis not present

## 2017-05-10 DIAGNOSIS — T839XXA Unspecified complication of genitourinary prosthetic device, implant and graft, initial encounter: Secondary | ICD-10-CM

## 2017-05-10 NOTE — Progress Notes (Signed)
Location:  Occupational psychologist of Service:  SNF (31) Provider:   Cindi Carbon, ANP [place] [phone]  Gayland Curry, DO  Patient Care Team: Gayland Curry, DO as PCP - General (Geriatric Medicine) Fay Records, [facility]  Extended Emergency Contact Information Primary Emergency Contact: Thacker,Shirley Address: [address]  [place] of [place] Phone: [phone] Mobile Phone: [phone] Relation: Other Secondary Emergency Contact: Powell,Katherine  [place] Phone: [phone] Mobile Phone: [phone] Relation: Other  Code Status:  DNR Goals of care: Advanced Directive information Advanced Directives 05/03/2017  Does Patient Have a Medical Advance Directive? Yes  Type of Advance Directive Out of facility DNR (pink MOST or yellow form);Healthcare Power of Attorney  Does patient want to make changes to medical advance directive? -  Copy of [place] in Chart? Yes  Would patient like information on creating a medical advance directive? -  Pre-existing out of facility DNR order (yellow form or pink MOST form) Yellow form placed in chart (order not valid for inpatient use);Pink MOST form placed in chart (order not valid for inpatient use)     Chief Complaint  Patient presents with  . Acute Visit    bony protrusion to right knee    HPI:  Pt is a 83 y.o. female seen today for an acute visit for evaluation regarding positioning and also sediment in the urine.  On 03/17/17 she was sent to the hospital after a fall and was diagnosed with a right oblique fracture of the distal right femur with posterior and medial displacement.  Due to her age and goals of care she was not operated on and returned to the facility on hospice.  She has been using an abductor pillow for positioning. For my visit today the knee is propped up at a 90 degree angle and there is a bony  protrusion noted without skin perforation. She appears comfortable and is on scheduled oxycodone for pain. In addition, her caregiver and POA mentioned that her urine has a foul odor and sediment. Ms. Larock has a poor appetite and and is losing weight and does not drink enough fluid. Her goals of care are comfort based. She has been in a state of FTT for quite sometime  Only worsened by this fall and subsequent fx.    Past Medical History:  Diagnosis Date  . Arthritis   . Carpal tunnel syndrome on left   . Dementia   . Diverticulosis   . DJD (degenerative joint disease)   . Essential hypertension 06/29/2013   D/c 'd amlodipine  08/27/2014 due to low bp and relative tachycardia    . GERD (gastroesophageal reflux disease)   . Headache(784.0)   . High blood pressure    echo 09-normal  . Lower GI bleed 06/2013   required 2 units PRBC  . Macular degeneration syndrome   . Pleural effusion 08/07/2014  . Wears glasses   . Wears hearing aid    both  . Wears partial dentures    top and bottom   Past Surgical History:  Procedure Laterality Date  . APPENDECTOMY    . CARPAL TUNNEL RELEASE  2/12   rt  . CARPAL TUNNEL RELEASE  04/23/2012   Procedure: CARPAL TUNNEL RELEASE;  Surgeon: Wynonia Sours, MD;  Location: [place];  Service: Orthopedics;  Laterality: Left;  . EYE SURGERY     cataractas  . HIP SURGERY  left  . JOINT REPLACEMENT  2006   rt total hip x2  . KNEE SURGERY    . THYROID CYST EXCISION    . TONSILLECTOMY      Allergies  Allergen Reactions  . Celebrex [Celecoxib] Itching  . Morphine And Related Nausea Only  . Vicodin [Hydrocodone-Acetaminophen] Itching  . Sulfa Antibiotics Rash    Outpatient Encounter Prescriptions as of 05/10/2017  Medication Sig  . albuterol (ACCUNEB) 1.25 MG/3ML nebulizer solution Take 1 ampule by nebulization every 6 (six) hours as needed for wheezing.  . Artificial Tear Ointment (ARTIFICIAL TEARS) ointment Place 2 drops  into the right eye at bedtime.  . carboxymethylcellulose (REFRESH PLUS) 0.5 % SOLN 2 drops at bedtime.  . ENSURE (ENSURE) Take 1 Can by mouth daily.  . haloperidol (HALDOL) 2 MG/ML solution Take 1 mL (2 mg total) by mouth every 6 (six) hours as needed for agitation.  Marland Kitchen LORazepam (ATIVAN) 0.5 MG tablet Take 1 tablet (0.5 mg total) by mouth every 6 (six) hours as needed for anxiety.  . ondansetron (ZOFRAN) 4 MG tablet Take 1 tablet (4 mg total) by mouth every 6 (six) hours as needed for nausea.  Marland Kitchen oxyCODONE (OXY IR/ROXICODONE) 5 MG immediate release tablet Take 1 tablet (5 mg total) by mouth every 3 (three) hours as needed for moderate pain or severe pain.  Marland Kitchen oxyCODONE (ROXICODONE) 5 MG immediate release tablet Take 1 tablet (5 mg total) by mouth every 6 (six) hours.  . senna-docusate (SENOKOT-S) 8.6-50 MG tablet Take 1 tablet by mouth at bedtime.   No facility-administered encounter medications on file as of 05/10/2017.     Review of Systems  Unable to perform ROS: Dementia    Immunization History  Administered Date(s) Administered  . Influenza Inj Mdck Quad Pf 06/15/2016  . Influenza Split 05/28/2014  . Influenza-Unspecified 06/10/2015  . Pneumococcal Conjugate-13 03/03/2014  . Pneumococcal Polysaccharide-23 04/05/2016  . Td 10/23/2016   Pertinent  Health Maintenance Due  Topic Date Due  . INFLUENZA VACCINE  03/28/2017  . DEXA SCAN  Completed  . PNA vac Low Risk Adult  Completed   Fall Risk  12/27/2016 08/12/2015 07/15/2015 02/15/2015 01/07/2015  Falls in the past year? No Yes Yes Yes Yes  Number falls in past yr: - 1 1 1 1   Injury with Fall? - No - - Yes  Risk Factor Category  - - - High Fall Risk High Fall Risk  Risk for fall due to : - History of fall(s);Impaired balance/gait History of fall(s) History of fall(s) History of fall(s)  Follow up - Falls evaluation completed Falls evaluation completed - Falls evaluation completed   Functional Status Survey:    There were no  vitals filed for this visit. There is no height or weight on file to calculate BMI. Physical Exam  Constitutional: No distress.  Cardiovascular: Normal rate and regular rhythm.   Pulmonary/Chest: Effort normal and breath sounds normal.  Abdominal: Soft. Bowel sounds are normal. She exhibits no distension. There is no tenderness.  Genitourinary:  Genitourinary Comments: Foley with dark yellow urine, sediment noted.   Musculoskeletal: She exhibits edema and tenderness.  Right knee with bruising and edema which has improved over time. Right knee bent at 90 degrees with bony prominence which appears to be the distal femur.    Neurological: She is alert.  Skin: Skin is warm and dry. She is not diaphoretic.  Psychiatric: She has a normal mood and affect.  Vitals reviewed.   Labs reviewed:  Recent Labs  10/03/16 12/11/16 03/17/17 0528  NA 140 142 138  K 4.3 3.8 3.6  CL  --   --  100*  CO2  --   --  27  GLUCOSE  --   --  114*  BUN 17 14 14   CREATININE 0.7 0.6 0.59  CALCIUM  --   --  10.0    Recent Labs  12/11/16  AST 19  ALT 11  ALKPHOS 79    Recent Labs  12/11/16 03/17/17 0528  WBC 10.3 10.5  NEUTROABS  --  8.0*  HGB 14.8 13.3  HCT 46 40.0  MCV  --  101.8*  PLT 247 359   Lab Results  Component Value Date   TSH 4.26 11/17/2014   No results found for: HGBA1C No results found for: CHOL, HDL, LDLCALC, LDLDIRECT, TRIG, CHOLHDL  Significant Diagnostic Results in last 30 days:  No results found.  Assessment/Plan 1. 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 Prevent adduction with rolling to prevent bony protrusion (no current skin breakdown) Continue scheduled oxycodone every six hours   2. Problem with Foley catheter, initial encounter [facility]) Staff will change  the catheter and encourage fluids and ensure proper drainage Due to her goals of care we will not check a urine for infection as she is not uncomfortable Her POA, Enid Derry, was present for the visit and in agreement with this plan.

## 2017-05-14 DIAGNOSIS — I672 Cerebral atherosclerosis: Secondary | ICD-10-CM | POA: Diagnosis not present

## 2017-05-14 DIAGNOSIS — I10 Essential (primary) hypertension: Secondary | ICD-10-CM | POA: Diagnosis not present

## 2017-05-14 DIAGNOSIS — I495 Sick sinus syndrome: Secondary | ICD-10-CM | POA: Diagnosis not present

## 2017-05-14 DIAGNOSIS — F0151 Vascular dementia with behavioral disturbance: Secondary | ICD-10-CM | POA: Diagnosis not present

## 2017-05-14 DIAGNOSIS — S72401S Unspecified fracture of lower end of right femur, sequela: Secondary | ICD-10-CM | POA: Diagnosis not present

## 2017-05-14 DIAGNOSIS — E46 Unspecified protein-calorie malnutrition: Secondary | ICD-10-CM | POA: Diagnosis not present

## 2017-05-18 DIAGNOSIS — F0151 Vascular dementia with behavioral disturbance: Secondary | ICD-10-CM | POA: Diagnosis not present

## 2017-05-18 DIAGNOSIS — I495 Sick sinus syndrome: Secondary | ICD-10-CM | POA: Diagnosis not present

## 2017-05-18 DIAGNOSIS — I672 Cerebral atherosclerosis: Secondary | ICD-10-CM | POA: Diagnosis not present

## 2017-05-18 DIAGNOSIS — S72401S Unspecified fracture of lower end of right femur, sequela: Secondary | ICD-10-CM | POA: Diagnosis not present

## 2017-05-18 DIAGNOSIS — I10 Essential (primary) hypertension: Secondary | ICD-10-CM | POA: Diagnosis not present

## 2017-05-18 DIAGNOSIS — E46 Unspecified protein-calorie malnutrition: Secondary | ICD-10-CM | POA: Diagnosis not present

## 2017-05-23 DIAGNOSIS — I672 Cerebral atherosclerosis: Secondary | ICD-10-CM | POA: Diagnosis not present

## 2017-05-23 DIAGNOSIS — F0151 Vascular dementia with behavioral disturbance: Secondary | ICD-10-CM | POA: Diagnosis not present

## 2017-05-23 DIAGNOSIS — I10 Essential (primary) hypertension: Secondary | ICD-10-CM | POA: Diagnosis not present

## 2017-05-23 DIAGNOSIS — S72401S Unspecified fracture of lower end of right femur, sequela: Secondary | ICD-10-CM | POA: Diagnosis not present

## 2017-05-23 DIAGNOSIS — I495 Sick sinus syndrome: Secondary | ICD-10-CM | POA: Diagnosis not present

## 2017-05-23 DIAGNOSIS — E46 Unspecified protein-calorie malnutrition: Secondary | ICD-10-CM | POA: Diagnosis not present

## 2017-05-28 DIAGNOSIS — S72401S Unspecified fracture of lower end of right femur, sequela: Secondary | ICD-10-CM | POA: Diagnosis not present

## 2017-05-28 DIAGNOSIS — H11149 Conjunctival xerosis, unspecified, unspecified eye: Secondary | ICD-10-CM | POA: Diagnosis not present

## 2017-05-28 DIAGNOSIS — M2450 Contracture, unspecified joint: Secondary | ICD-10-CM | POA: Diagnosis not present

## 2017-05-28 DIAGNOSIS — R54 Age-related physical debility: Secondary | ICD-10-CM | POA: Diagnosis not present

## 2017-05-28 DIAGNOSIS — I495 Sick sinus syndrome: Secondary | ICD-10-CM | POA: Diagnosis not present

## 2017-05-28 DIAGNOSIS — J309 Allergic rhinitis, unspecified: Secondary | ICD-10-CM | POA: Diagnosis not present

## 2017-05-28 DIAGNOSIS — E46 Unspecified protein-calorie malnutrition: Secondary | ICD-10-CM | POA: Diagnosis not present

## 2017-05-28 DIAGNOSIS — I10 Essential (primary) hypertension: Secondary | ICD-10-CM | POA: Diagnosis not present

## 2017-05-28 DIAGNOSIS — F0151 Vascular dementia with behavioral disturbance: Secondary | ICD-10-CM | POA: Diagnosis not present

## 2017-05-28 DIAGNOSIS — I672 Cerebral atherosclerosis: Secondary | ICD-10-CM | POA: Diagnosis not present

## 2017-05-28 DIAGNOSIS — K219 Gastro-esophageal reflux disease without esophagitis: Secondary | ICD-10-CM | POA: Diagnosis not present

## 2017-05-30 DIAGNOSIS — S72401S Unspecified fracture of lower end of right femur, sequela: Secondary | ICD-10-CM | POA: Diagnosis not present

## 2017-05-30 DIAGNOSIS — I495 Sick sinus syndrome: Secondary | ICD-10-CM | POA: Diagnosis not present

## 2017-05-30 DIAGNOSIS — E46 Unspecified protein-calorie malnutrition: Secondary | ICD-10-CM | POA: Diagnosis not present

## 2017-05-30 DIAGNOSIS — I10 Essential (primary) hypertension: Secondary | ICD-10-CM | POA: Diagnosis not present

## 2017-05-30 DIAGNOSIS — F0151 Vascular dementia with behavioral disturbance: Secondary | ICD-10-CM | POA: Diagnosis not present

## 2017-05-30 DIAGNOSIS — I672 Cerebral atherosclerosis: Secondary | ICD-10-CM | POA: Diagnosis not present

## 2017-06-01 DIAGNOSIS — I495 Sick sinus syndrome: Secondary | ICD-10-CM | POA: Diagnosis not present

## 2017-06-01 DIAGNOSIS — I10 Essential (primary) hypertension: Secondary | ICD-10-CM | POA: Diagnosis not present

## 2017-06-01 DIAGNOSIS — S72401S Unspecified fracture of lower end of right femur, sequela: Secondary | ICD-10-CM | POA: Diagnosis not present

## 2017-06-01 DIAGNOSIS — I672 Cerebral atherosclerosis: Secondary | ICD-10-CM | POA: Diagnosis not present

## 2017-06-01 DIAGNOSIS — E46 Unspecified protein-calorie malnutrition: Secondary | ICD-10-CM | POA: Diagnosis not present

## 2017-06-01 DIAGNOSIS — F0151 Vascular dementia with behavioral disturbance: Secondary | ICD-10-CM | POA: Diagnosis not present

## 2017-06-02 DIAGNOSIS — I672 Cerebral atherosclerosis: Secondary | ICD-10-CM | POA: Diagnosis not present

## 2017-06-02 DIAGNOSIS — F0151 Vascular dementia with behavioral disturbance: Secondary | ICD-10-CM | POA: Diagnosis not present

## 2017-06-02 DIAGNOSIS — E46 Unspecified protein-calorie malnutrition: Secondary | ICD-10-CM | POA: Diagnosis not present

## 2017-06-02 DIAGNOSIS — S72401S Unspecified fracture of lower end of right femur, sequela: Secondary | ICD-10-CM | POA: Diagnosis not present

## 2017-06-02 DIAGNOSIS — I10 Essential (primary) hypertension: Secondary | ICD-10-CM | POA: Diagnosis not present

## 2017-06-02 DIAGNOSIS — I495 Sick sinus syndrome: Secondary | ICD-10-CM | POA: Diagnosis not present

## 2017-06-04 DIAGNOSIS — I10 Essential (primary) hypertension: Secondary | ICD-10-CM | POA: Diagnosis not present

## 2017-06-04 DIAGNOSIS — I495 Sick sinus syndrome: Secondary | ICD-10-CM | POA: Diagnosis not present

## 2017-06-04 DIAGNOSIS — I672 Cerebral atherosclerosis: Secondary | ICD-10-CM | POA: Diagnosis not present

## 2017-06-04 DIAGNOSIS — F0151 Vascular dementia with behavioral disturbance: Secondary | ICD-10-CM | POA: Diagnosis not present

## 2017-06-04 DIAGNOSIS — S72401S Unspecified fracture of lower end of right femur, sequela: Secondary | ICD-10-CM | POA: Diagnosis not present

## 2017-06-04 DIAGNOSIS — E46 Unspecified protein-calorie malnutrition: Secondary | ICD-10-CM | POA: Diagnosis not present

## 2017-06-05 DIAGNOSIS — E46 Unspecified protein-calorie malnutrition: Secondary | ICD-10-CM | POA: Diagnosis not present

## 2017-06-05 DIAGNOSIS — S72401S Unspecified fracture of lower end of right femur, sequela: Secondary | ICD-10-CM | POA: Diagnosis not present

## 2017-06-05 DIAGNOSIS — I495 Sick sinus syndrome: Secondary | ICD-10-CM | POA: Diagnosis not present

## 2017-06-05 DIAGNOSIS — I672 Cerebral atherosclerosis: Secondary | ICD-10-CM | POA: Diagnosis not present

## 2017-06-05 DIAGNOSIS — I10 Essential (primary) hypertension: Secondary | ICD-10-CM | POA: Diagnosis not present

## 2017-06-05 DIAGNOSIS — F0151 Vascular dementia with behavioral disturbance: Secondary | ICD-10-CM | POA: Diagnosis not present

## 2017-06-06 DIAGNOSIS — I10 Essential (primary) hypertension: Secondary | ICD-10-CM | POA: Diagnosis not present

## 2017-06-06 DIAGNOSIS — E46 Unspecified protein-calorie malnutrition: Secondary | ICD-10-CM | POA: Diagnosis not present

## 2017-06-06 DIAGNOSIS — I672 Cerebral atherosclerosis: Secondary | ICD-10-CM | POA: Diagnosis not present

## 2017-06-06 DIAGNOSIS — I495 Sick sinus syndrome: Secondary | ICD-10-CM | POA: Diagnosis not present

## 2017-06-06 DIAGNOSIS — S72401S Unspecified fracture of lower end of right femur, sequela: Secondary | ICD-10-CM | POA: Diagnosis not present

## 2017-06-06 DIAGNOSIS — F0151 Vascular dementia with behavioral disturbance: Secondary | ICD-10-CM | POA: Diagnosis not present

## 2017-06-28 DEATH — deceased
